# Patient Record
Sex: Female | Born: 1950
Health system: Southern US, Community
[De-identification: ages and names within clinical notes are randomized; demographics above are authoritative.]

## PROBLEM LIST (undated history)

## (undated) DIAGNOSIS — N83209 Unspecified ovarian cyst, unspecified side: Secondary | ICD-10-CM

## (undated) DIAGNOSIS — C801 Malignant (primary) neoplasm, unspecified: Secondary | ICD-10-CM

## (undated) DIAGNOSIS — E7800 Pure hypercholesterolemia, unspecified: Secondary | ICD-10-CM

## (undated) DIAGNOSIS — D0510 Intraductal carcinoma in situ of unspecified breast: Secondary | ICD-10-CM

## (undated) DIAGNOSIS — M8580 Other specified disorders of bone density and structure, unspecified site: Secondary | ICD-10-CM

## (undated) HISTORY — DX: Intraductal carcinoma in situ of unspecified breast: D05.10

## (undated) HISTORY — DX: Pure hypercholesterolemia, unspecified: E78.00

## (undated) HISTORY — DX: Malignant (primary) neoplasm, unspecified: C80.1

## (undated) HISTORY — DX: Unspecified ovarian cyst, unspecified side: N83.209

## (undated) HISTORY — DX: Other specified disorders of bone density and structure, unspecified site: M85.80

---

## 1976-12-07 HISTORY — PX: SALPINGOOPHORECTOMY: SHX82

## 1998-07-12 ENCOUNTER — Other Ambulatory Visit: Admission: RE | Admit: 1998-07-12 | Discharge: 1998-07-12 | Payer: Self-pay | Admitting: Obstetrics and Gynecology

## 1999-08-08 ENCOUNTER — Other Ambulatory Visit: Admission: RE | Admit: 1999-08-08 | Discharge: 1999-08-08 | Payer: Self-pay | Admitting: Obstetrics and Gynecology

## 2000-02-06 ENCOUNTER — Encounter: Admission: RE | Admit: 2000-02-06 | Discharge: 2000-02-06 | Payer: Self-pay | Admitting: Obstetrics and Gynecology

## 2000-02-06 ENCOUNTER — Encounter: Payer: Self-pay | Admitting: Obstetrics and Gynecology

## 2000-08-13 ENCOUNTER — Other Ambulatory Visit: Admission: RE | Admit: 2000-08-13 | Discharge: 2000-08-13 | Payer: Self-pay | Admitting: Obstetrics and Gynecology

## 2001-02-11 ENCOUNTER — Encounter: Payer: Self-pay | Admitting: Obstetrics and Gynecology

## 2001-02-11 ENCOUNTER — Encounter: Admission: RE | Admit: 2001-02-11 | Discharge: 2001-02-11 | Payer: Self-pay | Admitting: Obstetrics and Gynecology

## 2001-08-19 ENCOUNTER — Other Ambulatory Visit: Admission: RE | Admit: 2001-08-19 | Discharge: 2001-08-19 | Payer: Self-pay | Admitting: Obstetrics and Gynecology

## 2002-02-17 ENCOUNTER — Encounter: Payer: Self-pay | Admitting: Obstetrics and Gynecology

## 2002-02-17 ENCOUNTER — Encounter: Admission: RE | Admit: 2002-02-17 | Discharge: 2002-02-17 | Payer: Self-pay | Admitting: Obstetrics and Gynecology

## 2002-08-25 ENCOUNTER — Other Ambulatory Visit: Admission: RE | Admit: 2002-08-25 | Discharge: 2002-08-25 | Payer: Self-pay | Admitting: Obstetrics and Gynecology

## 2002-11-17 ENCOUNTER — Ambulatory Visit (HOSPITAL_COMMUNITY): Admission: RE | Admit: 2002-11-17 | Discharge: 2002-11-17 | Payer: Self-pay | Admitting: Gastroenterology

## 2003-03-02 ENCOUNTER — Encounter: Admission: RE | Admit: 2003-03-02 | Discharge: 2003-03-02 | Payer: Self-pay | Admitting: Obstetrics and Gynecology

## 2003-03-02 ENCOUNTER — Encounter: Payer: Self-pay | Admitting: Obstetrics and Gynecology

## 2003-08-31 ENCOUNTER — Other Ambulatory Visit: Admission: RE | Admit: 2003-08-31 | Discharge: 2003-08-31 | Payer: Self-pay | Admitting: Obstetrics and Gynecology

## 2004-03-07 ENCOUNTER — Encounter: Admission: RE | Admit: 2004-03-07 | Discharge: 2004-03-07 | Payer: Self-pay | Admitting: Obstetrics and Gynecology

## 2004-09-05 ENCOUNTER — Other Ambulatory Visit: Admission: RE | Admit: 2004-09-05 | Discharge: 2004-09-05 | Payer: Self-pay | Admitting: Obstetrics and Gynecology

## 2004-12-07 HISTORY — PX: BREAST BIOPSY: SHX20

## 2004-12-07 HISTORY — PX: BREAST LUMPECTOMY: SHX2

## 2005-03-13 ENCOUNTER — Encounter: Admission: RE | Admit: 2005-03-13 | Discharge: 2005-03-13 | Payer: Self-pay | Admitting: Obstetrics and Gynecology

## 2005-03-30 ENCOUNTER — Encounter: Admission: RE | Admit: 2005-03-30 | Discharge: 2005-03-30 | Payer: Self-pay | Admitting: Obstetrics and Gynecology

## 2005-09-10 ENCOUNTER — Other Ambulatory Visit: Admission: RE | Admit: 2005-09-10 | Discharge: 2005-09-10 | Payer: Self-pay | Admitting: Obstetrics and Gynecology

## 2005-09-11 ENCOUNTER — Encounter (INDEPENDENT_AMBULATORY_CARE_PROVIDER_SITE_OTHER): Payer: Self-pay | Admitting: *Deleted

## 2005-09-11 ENCOUNTER — Encounter: Admission: RE | Admit: 2005-09-11 | Discharge: 2005-09-11 | Payer: Self-pay | Admitting: Obstetrics and Gynecology

## 2005-09-11 ENCOUNTER — Encounter (INDEPENDENT_AMBULATORY_CARE_PROVIDER_SITE_OTHER): Payer: Self-pay | Admitting: Diagnostic Radiology

## 2005-09-14 ENCOUNTER — Encounter: Admission: RE | Admit: 2005-09-14 | Discharge: 2005-09-14 | Payer: Self-pay | Admitting: Obstetrics and Gynecology

## 2005-09-17 ENCOUNTER — Encounter: Admission: RE | Admit: 2005-09-17 | Discharge: 2005-09-17 | Payer: Self-pay | Admitting: General Surgery

## 2005-09-23 ENCOUNTER — Ambulatory Visit (HOSPITAL_BASED_OUTPATIENT_CLINIC_OR_DEPARTMENT_OTHER): Admission: RE | Admit: 2005-09-23 | Discharge: 2005-09-23 | Payer: Self-pay | Admitting: General Surgery

## 2005-09-23 ENCOUNTER — Ambulatory Visit (HOSPITAL_COMMUNITY): Admission: RE | Admit: 2005-09-23 | Discharge: 2005-09-23 | Payer: Self-pay | Admitting: General Surgery

## 2005-09-23 ENCOUNTER — Encounter (INDEPENDENT_AMBULATORY_CARE_PROVIDER_SITE_OTHER): Payer: Self-pay | Admitting: *Deleted

## 2005-09-23 ENCOUNTER — Encounter: Admission: RE | Admit: 2005-09-23 | Discharge: 2005-09-23 | Payer: Self-pay | Admitting: General Surgery

## 2005-10-05 ENCOUNTER — Ambulatory Visit: Payer: Self-pay | Admitting: Oncology

## 2005-10-14 ENCOUNTER — Ambulatory Visit: Admission: RE | Admit: 2005-10-14 | Discharge: 2005-12-23 | Payer: Self-pay | Admitting: Radiation Oncology

## 2005-12-07 DIAGNOSIS — Z923 Personal history of irradiation: Secondary | ICD-10-CM

## 2005-12-07 HISTORY — PX: BREAST BIOPSY: SHX20

## 2005-12-07 HISTORY — DX: Personal history of irradiation: Z92.3

## 2006-01-07 ENCOUNTER — Ambulatory Visit: Payer: Self-pay | Admitting: Oncology

## 2006-09-17 ENCOUNTER — Encounter: Admission: RE | Admit: 2006-09-17 | Discharge: 2006-09-17 | Payer: Self-pay | Admitting: General Surgery

## 2006-09-17 ENCOUNTER — Other Ambulatory Visit: Admission: RE | Admit: 2006-09-17 | Discharge: 2006-09-17 | Payer: Self-pay | Admitting: Obstetrics and Gynecology

## 2006-09-20 ENCOUNTER — Encounter (INDEPENDENT_AMBULATORY_CARE_PROVIDER_SITE_OTHER): Payer: Self-pay | Admitting: *Deleted

## 2006-09-20 ENCOUNTER — Encounter: Admission: RE | Admit: 2006-09-20 | Discharge: 2006-09-20 | Payer: Self-pay | Admitting: General Surgery

## 2007-03-11 ENCOUNTER — Encounter: Admission: RE | Admit: 2007-03-11 | Discharge: 2007-03-11 | Payer: Self-pay | Admitting: Obstetrics and Gynecology

## 2007-05-20 ENCOUNTER — Ambulatory Visit (HOSPITAL_COMMUNITY): Admission: RE | Admit: 2007-05-20 | Discharge: 2007-05-20 | Payer: Self-pay | Admitting: Internal Medicine

## 2007-09-23 ENCOUNTER — Other Ambulatory Visit: Admission: RE | Admit: 2007-09-23 | Discharge: 2007-09-23 | Payer: Self-pay | Admitting: Obstetrics and Gynecology

## 2007-09-29 ENCOUNTER — Encounter: Admission: RE | Admit: 2007-09-29 | Discharge: 2007-09-29 | Payer: Self-pay | Admitting: Obstetrics and Gynecology

## 2008-10-04 ENCOUNTER — Encounter: Admission: RE | Admit: 2008-10-04 | Discharge: 2008-10-04 | Payer: Self-pay | Admitting: Obstetrics and Gynecology

## 2008-10-05 ENCOUNTER — Other Ambulatory Visit: Admission: RE | Admit: 2008-10-05 | Discharge: 2008-10-05 | Payer: Self-pay | Admitting: Obstetrics and Gynecology

## 2008-10-05 ENCOUNTER — Ambulatory Visit: Payer: Self-pay | Admitting: Obstetrics and Gynecology

## 2008-10-05 ENCOUNTER — Encounter: Payer: Self-pay | Admitting: Obstetrics and Gynecology

## 2009-03-25 ENCOUNTER — Ambulatory Visit: Payer: Self-pay | Admitting: Obstetrics and Gynecology

## 2009-10-11 ENCOUNTER — Encounter: Admission: RE | Admit: 2009-10-11 | Discharge: 2009-10-11 | Payer: Self-pay | Admitting: Obstetrics and Gynecology

## 2009-10-18 ENCOUNTER — Other Ambulatory Visit: Admission: RE | Admit: 2009-10-18 | Discharge: 2009-10-18 | Payer: Self-pay | Admitting: Obstetrics and Gynecology

## 2009-10-18 ENCOUNTER — Encounter: Payer: Self-pay | Admitting: Obstetrics and Gynecology

## 2009-10-18 ENCOUNTER — Ambulatory Visit: Payer: Self-pay | Admitting: Obstetrics and Gynecology

## 2010-10-17 ENCOUNTER — Encounter: Admission: RE | Admit: 2010-10-17 | Discharge: 2010-10-17 | Payer: Self-pay | Admitting: Obstetrics and Gynecology

## 2010-11-14 ENCOUNTER — Ambulatory Visit: Payer: Self-pay | Admitting: Obstetrics and Gynecology

## 2010-11-14 ENCOUNTER — Other Ambulatory Visit
Admission: RE | Admit: 2010-11-14 | Discharge: 2010-11-14 | Payer: Self-pay | Source: Home / Self Care | Admitting: Obstetrics and Gynecology

## 2010-12-28 ENCOUNTER — Encounter: Payer: Self-pay | Admitting: Obstetrics and Gynecology

## 2011-04-24 NOTE — Op Note (Signed)
NAMECARLOTA, Shawna [name]                           ACCOUNT NO.:  [id number]   MEDICAL RECORD NO.:  [id number]                   PATIENT TYPE:  AMB   LOCATION:  ENDO                                 FACILITY:  [facility]   PHYSICIAN:  James L. Malon Kindle., M.D.          DATE OF BIRTH:  22-Feb-1951   DATE OF PROCEDURE:  11/17/2002  DATE OF DISCHARGE:                                 OPERATIVE REPORT   PROCEDURE PERFORMED:  Colonoscopy.   ENDOSCOPIST:  Llana Aliment. Edwards, M.D.   MEDICATIONS:  Fentanyl 80 mcg, Versed 8 mg IV.   INDICATIONS FOR PROCEDURE:  Strong family history of colon cancer.  Father  had colon cancer.  This is done for screening purposes.   DESCRIPTION OF PROCEDURE:  The procedure had been explained to the patient  and consent obtained.  With the patient in the left lateral decubitus  position, the pediatric adjustable Olympus scope was inserted and advanced  under direct visualization.  The prep was quite good.  The patient had an  extremely long tortuous colon and multiple maneuvers were required to  advance beyond the sigmoid including abdominal pressure and position  changes.  Finally with the patient in the right lateral decubitus position,  we were able to advance and using pressure we were able to advance to the  cecum.  The ileocecal valve and appendiceal orifice were seen.  The scope  was withdrawn and the cecum, ascending colon, transverse colon, descending  and sigmoid colon were seen well.  No diverticulosis, no polyps seen  throughout.  The rectum was free of polyps.  The scope was withdrawn.  The  patient tolerated the procedure well, maintained on low-flow oxygen and  pulse oximeter throughout the procedure.   ASSESSMENT:  No evidence of colon polyps in this high risk individual.   PLAN:  We will recommend yearly hemoccults and consider another colonoscopy  in five years.                                                James L. Malon Kindle., M.D.    Waldron Session  D:  11/17/2002  T:  11/17/2002  Job:  [id number]   cc:   Wilson Singer, M.D.  [address]  [place]  [place] [address]  Fax: [phone]

## 2011-04-24 NOTE — Op Note (Signed)
[name], [name]               ACCOUNT NO.:  [id number]   MEDICAL RECORD NO.:  [id number]          PATIENT TYPE:  AMB   LOCATION:  DSC                          FACILITY:  [facility]   PHYSICIAN:  Rose Phi. Maple Hudson, M.D.   DATE OF BIRTH:  1951/02/26   DATE OF PROCEDURE:  09/23/2005  DATE OF DISCHARGE:                                 OPERATIVE REPORT   PREOPERATIVE DIAGNOSIS:  Ductal carcinoma in situ of the left breast.   POSTOPERATIVE DIAGNOSIS:  Ductal carcinoma in situ of the left breast.   PROCEDURE:  Left partial mastectomy with needle localization and specimen  mammogram.   SURGEON:  Rose Phi. Maple Hudson, M.D.   ANESTHESIA:  General.   DESCRIPTION OF PROCEDURE:  Prior to coming to the operating room, two  bracketing wires were used to localize the area of calcifications in the 3  o'clock position of her left breast which had DCIS on biopsy.   After suitable general anesthesia was induced, the patient was placed in the  supine position with the arms extended on the arm board.  The left breast  was prepped and draped in the usual fashion.   A curved incision centered at the 3 o'clock position and using the two  previously placed wires as a guide was then outlined with the marking  pencil.  Incision was made and both wires were delivered into the middle of  the incision, and then a wide excision of the wires and the surrounding  tissue was carried out.  Hemostasis was obtained with the cautery. Specimen  mammogram confirmed the removal of the lesions.   With good hemostasis, I injected the incision with 0.25% Marcaine with  Adrenalin.   It was then closed in a single layer of subcuticular 4-0 Monocryl and Steri-  Strips.   Dressing applied, and the patient transferred to the recovery room in  satisfactory condition having tolerated the procedure well.      Rose Phi. Maple Hudson, M.D.  Electronically Signed     PRY/MEDQ  D:  09/23/2005  T:  09/23/2005  Job:  [id number]

## 2011-09-17 ENCOUNTER — Other Ambulatory Visit: Payer: Self-pay | Admitting: Obstetrics and Gynecology

## 2011-09-18 ENCOUNTER — Other Ambulatory Visit: Payer: Self-pay | Admitting: *Deleted

## 2011-09-18 DIAGNOSIS — Z9889 Other specified postprocedural states: Secondary | ICD-10-CM

## 2011-10-19 ENCOUNTER — Ambulatory Visit
Admission: RE | Admit: 2011-10-19 | Discharge: 2011-10-19 | Disposition: A | Discharge status: Home / Self Care | Payer: BC Managed Care – PPO | Source: Ambulatory Visit | Attending: Obstetrics and Gynecology | Admitting: Obstetrics and Gynecology

## 2011-10-19 DIAGNOSIS — Z9889 Other specified postprocedural states: Secondary | ICD-10-CM

## 2011-11-05 ENCOUNTER — Encounter: Payer: Self-pay | Admitting: *Deleted

## 2011-11-05 DIAGNOSIS — N83209 Unspecified ovarian cyst, unspecified side: Secondary | ICD-10-CM | POA: Insufficient documentation

## 2011-11-05 DIAGNOSIS — Z86000 Personal history of in-situ neoplasm of breast: Secondary | ICD-10-CM | POA: Insufficient documentation

## 2011-11-25 ENCOUNTER — Encounter: Payer: Self-pay | Admitting: Obstetrics and Gynecology

## 2011-11-25 ENCOUNTER — Ambulatory Visit (INDEPENDENT_AMBULATORY_CARE_PROVIDER_SITE_OTHER): Payer: BC Managed Care – PPO | Admitting: Obstetrics and Gynecology

## 2011-11-25 ENCOUNTER — Other Ambulatory Visit (HOSPITAL_COMMUNITY)
Admission: RE | Admit: 2011-11-25 | Discharge: 2011-11-25 | Disposition: A | Discharge status: Home / Self Care | Payer: BC Managed Care – PPO | Source: Ambulatory Visit | Attending: Obstetrics and Gynecology | Admitting: Obstetrics and Gynecology

## 2011-11-25 VITALS — BP 120/76 | Ht 63.0 in | Wt 186.0 lb

## 2011-11-25 DIAGNOSIS — M899 Disorder of bone, unspecified: Secondary | ICD-10-CM

## 2011-11-25 DIAGNOSIS — M8580 Other specified disorders of bone density and structure, unspecified site: Secondary | ICD-10-CM

## 2011-11-25 DIAGNOSIS — Z01419 Encounter for gynecological examination (general) (routine) without abnormal findings: Secondary | ICD-10-CM

## 2011-11-25 DIAGNOSIS — R823 Hemoglobinuria: Secondary | ICD-10-CM

## 2011-11-25 NOTE — Progress Notes (Signed)
Patient came to see me today for her annual GYN exam. She is not having menopausal symptoms. She is having no vaginal bleeding. She is having no pelvic pain. She is on drug holiday with osteopenia. She has had no fractures. She does her lab through PCP. She is up-to-date on mammograms. She is unemployed and is looking for a job. She has atrophic vaginitis but does not require treatment.  Physical examination:  Kennon Portela present HEENT within normal limits. Neck: Thyroid not large. No masses. Supraclavicular nodes: not enlarged. Breasts: Examined in both sitting midline position. No skin changes and no masses. Abdomen: Soft no guarding rebound or masses or hernia. Pelvic: External: Within normal limits. BUS: Within normal limits. Vaginal:within normal limits. Good estrogen effect. No evidence of cystocele rectocele or enterocele. Cervix: clean. Uterus: Normal size and shape. Adnexa: No masses. Rectovaginal exam: Confirmatory and negative. Extremities: Within normal limits.  Assessment: #1. Normal GYN exam #2. Atrophic vaginitis #3. Osteopenia on drug holiday  Plan: Bone density scheduled. Continue yearly mammograms.

## 2011-11-25 NOTE — Progress Notes (Signed)
Addended by: Cammie Mcgee T on: 11/25/2011 04:07 PM   Modules accepted: Orders

## 2012-03-03 ENCOUNTER — Ambulatory Visit (INDEPENDENT_AMBULATORY_CARE_PROVIDER_SITE_OTHER): Payer: BC Managed Care – PPO | Admitting: Surgery

## 2012-03-03 ENCOUNTER — Encounter (INDEPENDENT_AMBULATORY_CARE_PROVIDER_SITE_OTHER): Payer: Self-pay | Admitting: Surgery

## 2012-03-03 VITALS — BP 122/72 | HR 72 | Temp 97.7°F | Resp 18 | Ht 63.0 in | Wt 178.4 lb

## 2012-03-03 DIAGNOSIS — D0590 Unspecified type of carcinoma in situ of unspecified breast: Secondary | ICD-10-CM

## 2012-03-03 DIAGNOSIS — D0510 Intraductal carcinoma in situ of unspecified breast: Secondary | ICD-10-CM

## 2012-03-03 NOTE — Progress Notes (Signed)
[facility]  Ovidio Kin, MD,  FACS [address] [place]  [address] [place], [place]    [address] Phone:  [phone] FAX:  [phone]   Re:   Shawna [name] DOB:   [birth year] MRN:   [id number]  ASSESSMENT AND PLAN: 1.  Left breast cancer, DCIS.  Lumpectomy by Dr. Luan Moore - 09/23/2005  2.5 cm, ER - 99%, PR - 63%.  She had Rad Tx by Dr. Lyda Jester.  Sees Dr. Mervin Hack.  She did not take an anti-estrogen.  She is disease free.  She will see me in one year for follow up.  2.  Hypercholesterolemia.  HISTORY OF PRESENT ILLNESS: Chief Complaint  Patient presents with  . Breast Cancer Long Term Follow Up    Shawna [name] is a 61 y.o. (DOB: [birth year])  white female who is a patient of MCKEOWN,WILLIAM Karl Erway, MD, MD and comes to me today for follow up of left breast cancer.  She is doing well.  Has no new concern or problem.  PHYSICAL EXAM: BP 122/72  Pulse 72  Resp 18  Ht 5\' 3"  (1.6 m)  Wt 178 lb 6.4 oz (80.922 kg)  BMI 31.60 kg/m2  HEENT:  Pupils equal.  Dentition good.  No injury. NECK:  Supple.  No thyroid mass. LYMPH NODES:  No cervical, supraclavicular, or axillary adenopathy. BREASTS -  RIGHT:  No palpable mass or nodule.  No nipple discharge.   LEFT:  No palpable mass or nodule.  No nipple discharge.  Scar 3 o'clock position left breast. UPPER EXTREMITIES:  No evidence of lymphedema.  DATA REVIEWED: Mammogram at [facility] - 10/19/2011 - negative   Ovidio Kin, MD, FACS Office:  [phone]

## 2012-08-03 ENCOUNTER — Other Ambulatory Visit: Payer: Self-pay | Admitting: *Deleted

## 2012-08-03 DIAGNOSIS — Z853 Personal history of malignant neoplasm of breast: Secondary | ICD-10-CM

## 2012-10-25 ENCOUNTER — Ambulatory Visit
Admission: RE | Admit: 2012-10-25 | Discharge: 2012-10-25 | Disposition: A | Discharge status: Home / Self Care | Payer: BC Managed Care – PPO | Source: Ambulatory Visit | Attending: Obstetrics and Gynecology | Admitting: Obstetrics and Gynecology

## 2012-10-25 DIAGNOSIS — Z853 Personal history of malignant neoplasm of breast: Secondary | ICD-10-CM

## 2012-12-08 ENCOUNTER — Encounter: Payer: BC Managed Care – PPO | Admitting: Obstetrics and Gynecology

## 2012-12-12 ENCOUNTER — Encounter: Payer: Self-pay | Admitting: Gynecology

## 2012-12-20 ENCOUNTER — Ambulatory Visit (INDEPENDENT_AMBULATORY_CARE_PROVIDER_SITE_OTHER): Payer: BC Managed Care – PPO | Admitting: Gynecology

## 2012-12-20 ENCOUNTER — Encounter: Payer: Self-pay | Admitting: Gynecology

## 2012-12-20 VITALS — BP 124/78 | Ht 63.0 in | Wt 152.0 lb

## 2012-12-20 DIAGNOSIS — M899 Disorder of bone, unspecified: Secondary | ICD-10-CM

## 2012-12-20 DIAGNOSIS — Z01419 Encounter for gynecological examination (general) (routine) without abnormal findings: Secondary | ICD-10-CM

## 2012-12-20 DIAGNOSIS — M8580 Other specified disorders of bone density and structure, unspecified site: Secondary | ICD-10-CM

## 2012-12-20 DIAGNOSIS — N952 Postmenopausal atrophic vaginitis: Secondary | ICD-10-CM

## 2012-12-20 NOTE — Patient Instructions (Signed)
Followup for bone density as scheduled. 

## 2012-12-20 NOTE — Progress Notes (Signed)
Shawna [name] September 25, 1951 [id number]        62 y.o.  G0P0 for annual exam.  Former patient Dr. Verl [name]  Past medical history,surgical history, medications, allergies, family history and social history were all reviewed and documented in the EPIC chart. ROS:  Was performed and pertinent positives and negatives are included in the history.  Exam: Shawna [name] assistant Filed Vitals:   12/20/12 1610  BP: 124/78  Height: 5\' 3"  (1.6 m)  Weight: 152 lb (68.947 kg)   General appearance  Normal Skin grossly normal Head/Neck normal with no cervical or supraclavicular adenopathy thyroid normal Lungs  clear Cardiac RR, without RMG Abdominal  soft, nontender, without masses, organomegaly or hernia Breasts  examined lying and sitting without masses, retractions, discharge or axillary adenopathy.  Well-healed left lumpectomy scar. Pelvic  Ext/BUS/vagina  Atrophic changes with narrow introitus  Cervix  normal   Uterus  axial, normal size, shape and contour, midline and mobile nontender   Adnexa  Without masses or tenderness    Anus and perineum  normal   Rectovaginal  normal sphincter tone without palpated masses or tenderness.    Assessment/Plan:  62 y.o. G0P0 female for annual exam.   1. Postmenopausal/atrophic vaginitis.  Patient asymptomatic. She's not sexually active and this is not an issue and we'll continue to monitor. 2. Osteopenia. DEXA 03/2009 with T score -1.7. Had been on bisphosphonates to include Actonel and then Fosamax for approximately 4 years and then stopped 2010 for drug holiday. Has not had a follow up DEXA. Plan DEXA now. Is on extra vitamin D. To Dr. Kathryne [name] office. Is planning appointment with him and asked her make sure he checks a vitamin D level to make sure that she is within the appropriate range. Increase calcium reviewed. 3. Mammography 10/2012. Does have history of DCIS status post lumpectomy 2006. Continue with annual mammography. SBE monthly reviewed. 4. Colonoscopy  2009 due for repeat this year and she knows to schedule it. 5. Pap smear 11/2011. No Pap smear done this year. No history of abnormal Pap smears. Discussed current screening guidelines and will plan repeat at 3 year interval. 6. Health maintenance.  No blood work done as this is all done through her primary physician's office who she sees on a regular basis. Follow up for DEXA otherwise 1 year, sooner as needed.    Shawna Lords MD, 4:53 PM 12/20/2012

## 2012-12-21 LAB — URINALYSIS W MICROSCOPIC + REFLEX CULTURE
Bacteria, UA: NONE SEEN
Bilirubin Urine: NEGATIVE
Casts: NONE SEEN
Crystals: NONE SEEN
Glucose, UA: NEGATIVE mg/dL
Hgb urine dipstick: NEGATIVE
Ketones, ur: NEGATIVE mg/dL
Specific Gravity, Urine: 1.017 (ref 1.005–1.030)
pH: 7 (ref 5.0–8.0)

## 2013-01-23 ENCOUNTER — Other Ambulatory Visit (HOSPITAL_COMMUNITY): Payer: Self-pay | Admitting: Internal Medicine

## 2013-01-23 ENCOUNTER — Ambulatory Visit (HOSPITAL_COMMUNITY)
Admission: RE | Admit: 2013-01-23 | Discharge: 2013-01-23 | Disposition: A | Discharge status: Home / Self Care | Payer: BC Managed Care – PPO | Source: Ambulatory Visit | Attending: Internal Medicine | Admitting: Internal Medicine

## 2013-01-23 DIAGNOSIS — M25519 Pain in unspecified shoulder: Secondary | ICD-10-CM | POA: Insufficient documentation

## 2013-05-24 HISTORY — PX: COLONOSCOPY: SHX174

## 2013-05-24 LAB — HM COLONOSCOPY

## 2013-09-13 ENCOUNTER — Other Ambulatory Visit: Payer: Self-pay | Admitting: Gynecology

## 2013-09-13 DIAGNOSIS — Z9889 Other specified postprocedural states: Secondary | ICD-10-CM

## 2013-09-13 DIAGNOSIS — Z853 Personal history of malignant neoplasm of breast: Secondary | ICD-10-CM

## 2013-10-26 ENCOUNTER — Ambulatory Visit
Admission: RE | Admit: 2013-10-26 | Discharge: 2013-10-26 | Disposition: A | Discharge status: Home / Self Care | Payer: BC Managed Care – PPO | Source: Ambulatory Visit | Attending: Gynecology | Admitting: Gynecology

## 2013-10-26 DIAGNOSIS — Z853 Personal history of malignant neoplasm of breast: Secondary | ICD-10-CM

## 2013-10-26 DIAGNOSIS — Z9889 Other specified postprocedural states: Secondary | ICD-10-CM

## 2013-11-23 ENCOUNTER — Encounter: Payer: Self-pay | Admitting: Internal Medicine

## 2013-11-24 ENCOUNTER — Ambulatory Visit (INDEPENDENT_AMBULATORY_CARE_PROVIDER_SITE_OTHER): Payer: BC Managed Care – PPO | Admitting: Emergency Medicine

## 2013-11-24 ENCOUNTER — Encounter: Payer: Self-pay | Admitting: Emergency Medicine

## 2013-11-24 VITALS — BP 124/82 | HR 80 | Temp 98.0°F | Resp 18 | Ht 63.0 in | Wt 187.0 lb

## 2013-11-24 DIAGNOSIS — E782 Mixed hyperlipidemia: Secondary | ICD-10-CM | POA: Insufficient documentation

## 2013-11-24 DIAGNOSIS — E559 Vitamin D deficiency, unspecified: Secondary | ICD-10-CM | POA: Insufficient documentation

## 2013-11-24 DIAGNOSIS — R3915 Urgency of urination: Secondary | ICD-10-CM

## 2013-11-24 DIAGNOSIS — I10 Essential (primary) hypertension: Secondary | ICD-10-CM | POA: Insufficient documentation

## 2013-11-24 DIAGNOSIS — R7309 Other abnormal glucose: Secondary | ICD-10-CM

## 2013-11-24 LAB — CBC WITH DIFFERENTIAL/PLATELET
Basophils Absolute: 0 10*3/uL (ref 0.0–0.1)
Basophils Relative: 0 % (ref 0–1)
Eosinophils Relative: 1 % (ref 0–5)
HCT: 40.1 % (ref 36.0–46.0)
Lymphocytes Relative: 29 % (ref 12–46)
Lymphs Abs: 2.2 10*3/uL (ref 0.7–4.0)
MCV: 89.5 fL (ref 78.0–100.0)
Monocytes Absolute: 0.5 10*3/uL (ref 0.1–1.0)
Neutro Abs: 4.7 10*3/uL (ref 1.7–7.7)
Neutrophils Relative %: 64 % (ref 43–77)
Platelets: 223 10*3/uL (ref 150–400)
RBC: 4.48 MIL/uL (ref 3.87–5.11)
RDW: 14 % (ref 11.5–15.5)
WBC: 7.4 10*3/uL (ref 4.0–10.5)

## 2013-11-24 LAB — HEMOGLOBIN A1C
Hgb A1c MFr Bld: 6 % — ABNORMAL HIGH (ref <5.7)
Mean Plasma Glucose: 126 mg/dL — ABNORMAL HIGH (ref <117)

## 2013-11-24 NOTE — Patient Instructions (Signed)
PREDiabetes and Exercise Exercising regularly is important. It is not just about losing weight. It has many health benefits, such as:  Improving your overall fitness, flexibility, and endurance.  Increasing your bone density.  Helping with weight control.  Decreasing your body fat.  Increasing your muscle strength.  Reducing stress and tension.  Improving your overall health. People with diabetes who exercise gain additional benefits because exercise:  Reduces appetite.  Improves the body's use of blood sugar (glucose).  Helps lower or control blood glucose.  Decreases blood pressure.  Helps control blood lipids (such as cholesterol and triglycerides).  Improves the body's use of the hormone insulin by:  Increasing the body's insulin sensitivity.  Reducing the body's insulin needs.  Decreases the risk for heart disease because exercising:  Lowers cholesterol and triglycerides levels.  Increases the levels of good cholesterol (such as high-density lipoproteins [HDL]) in the body.  Lowers blood glucose levels. YOUR ACTIVITY PLAN  Choose an activity that you enjoy and set realistic goals. Your health care provider or diabetes educator can help you make an activity plan that works for you. You can break activities into 2 or 3 sessions throughout the day. Doing so is as good as one long session. Exercise ideas include:  Taking the dog for a walk.  Taking the stairs instead of the elevator.  Dancing to your favorite song.  Doing your favorite exercise with a friend. RECOMMENDATIONS FOR EXERCISING WITH TYPE 1 OR TYPE 2 DIABETES   Check your blood glucose before exercising. If blood glucose levels are greater than 240 mg/dL, check for urine ketones. Do not exercise if ketones are present.  Avoid injecting insulin into areas of the body that are going to be exercised. For example, avoid injecting insulin into:  The arms when playing tennis.  The legs when  jogging.  Keep a record of:  Food intake before and after you exercise.  Expected peak times of insulin action.  Blood glucose levels before and after you exercise.  The type and amount of exercise you have done.  Review your records with your health care provider. Your health care provider will help you to develop guidelines for adjusting food intake and insulin amounts before and after exercising.  If you take insulin or oral hypoglycemic agents, watch for signs and symptoms of hypoglycemia. They include:  Dizziness.  Shaking.  Sweating.  Chills.  Confusion.  Drink plenty of water while you exercise to prevent dehydration or heat stroke. Body water is lost during exercise and must be replaced.  Talk to your health care provider before starting an exercise program to make sure it is safe for you. Remember, almost any type of activity is better than none. Document Released: 02/13/2004 Document Revised: 07/26/2013 Document Reviewed: 05/02/2013 Exit[employer] Patient Information 2014 [employer].  

## 2013-11-24 NOTE — Progress Notes (Signed)
Subjective:    Patient ID: Shawna [name], female    DOB: June 24, 1951, 62 y.o.   MRN: [id number]  HPI Comments: 62 YO female presents for 3 month F/U for HTN, Cholesterol, Pre-Dm, D. Deficient. HER LAST LABS T 156 TG 86 H 52 LDL 87 A1C 6.2 INSULIN 32 MAG 1.8 D 58 Exercising sporadically. Eating healthy. Her BP was good at home.  Has noticed increased urine urgency x 3 weeks. She notes occasional cloudy. Denies any other UTI SX.  Hypertension  Hyperlipidemia   Current Outpatient Prescriptions on File Prior to Visit  Medication Sig Dispense Refill  . atorvastatin (LIPITOR) 80 MG tablet Take 80 mg by mouth daily.      . cholecalciferol (VITAMIN D) 1000 UNITS tablet Take 10,000 Units by mouth daily.       Marland Kitchen buPROPion (WELLBUTRIN SR) 150 MG 12 hr tablet Take 150 mg by mouth 2 (two) times daily.      . Calcium Carbonate-Vit D-Min (CALTRATE PLUS PO) Take by mouth.        . citalopram (CELEXA) 40 MG tablet Take 40 mg by mouth daily. Take 1/2 pill      . Multiple Vitamin (MULTIVITAMIN) tablet Take 1 tablet by mouth daily.         No current facility-administered medications on file prior to visit.   ALLERGIES Nitrofurantoin monohyd macro  Past Medical History  Diagnosis Date  . Hypercholesteremia   . 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 Serous cyst of ovary     left  . Osteopenia 03/2009    T score -1.7      Review of Systems  Genitourinary: Positive for urgency.  All other systems reviewed and are negative.   BP 124/82  Pulse 80  Temp(Src) 98 F (36.7 C) (Temporal)  Resp 18  Ht 5\' 3"  (1.6 m)  Wt 187 lb (84.823 kg)  BMI 33.13 kg/m2     Objective:   Physical Exam  Nursing note and vitals reviewed. Constitutional: She is oriented to person, place, and time. She appears well-developed and well-nourished. No distress.  HENT:  Head: Normocephalic and atraumatic.  Right Ear: External ear normal.  Left Ear: External ear normal.  Nose: Nose normal.   Mouth/Throat: Oropharynx is clear and moist.  Eyes: Conjunctivae and EOM are normal.  Neck: Normal range of motion. Neck supple. No JVD present. No thyromegaly present.  Cardiovascular: Normal rate, regular rhythm, normal heart sounds and intact distal pulses.   Pulmonary/Chest: Effort normal and breath sounds normal.  Abdominal: Soft. Bowel sounds are normal. She exhibits no distension and no mass. There is no tenderness. There is no rebound and no guarding.  Musculoskeletal: Normal range of motion. She exhibits no edema and no tenderness.  Lymphadenopathy:    She has no cervical adenopathy.  Neurological: She is alert and oriented to person, place, and time. No cranial nerve deficit.  Skin: Skin is warm and dry. No rash noted. No erythema. No pallor.  Psychiatric: She has a normal mood and affect. Her behavior is normal. Judgment and thought content normal.          Assessment & Plan:  1.  3 month F/U for HTN, Cholesterol, Pre-Dm, D. Deficient. Needs healthy diet, cardio QD and obtain healthy weight. Check Labs, Check BP if >130/80 call office 2.Urgency with urine- Check labs, Pelvic floor rehab if no improvement.

## 2013-11-25 LAB — URINALYSIS, ROUTINE W REFLEX MICROSCOPIC
Hgb urine dipstick: NEGATIVE
Ketones, ur: NEGATIVE mg/dL
Leukocytes, UA: NEGATIVE
Nitrite: NEGATIVE
Protein, ur: NEGATIVE mg/dL
Urobilinogen, UA: 0.2 mg/dL (ref 0.0–1.0)
pH: 6.5 (ref 5.0–8.0)

## 2013-11-25 LAB — HEPATIC FUNCTION PANEL
ALT: 15 U/L (ref 0–35)
AST: 18 U/L (ref 0–37)
Bilirubin, Direct: 0.1 mg/dL (ref 0.0–0.3)
Total Protein: 6.4 g/dL (ref 6.0–8.3)

## 2013-11-25 LAB — BASIC METABOLIC PANEL WITH GFR
BUN: 16 mg/dL (ref 6–23)
CO2: 27 mEq/L (ref 19–32)
Chloride: 104 mEq/L (ref 96–112)
Creat: 0.76 mg/dL (ref 0.50–1.10)
GFR, Est Non African American: 84 mL/min
Glucose, Bld: 102 mg/dL — ABNORMAL HIGH (ref 70–99)
Potassium: 4.6 mEq/L (ref 3.5–5.3)

## 2013-11-25 LAB — INSULIN, FASTING: Insulin fasting, serum: 28 u[IU]/mL (ref 3–28)

## 2013-11-25 LAB — LIPID PANEL
Cholesterol: 167 mg/dL (ref 0–200)
Triglycerides: 63 mg/dL (ref <150)

## 2013-11-26 ENCOUNTER — Other Ambulatory Visit: Payer: Self-pay | Admitting: Emergency Medicine

## 2013-11-26 DIAGNOSIS — R3915 Urgency of urination: Secondary | ICD-10-CM

## 2013-11-26 LAB — URINE CULTURE: Colony Count: 3000

## 2013-11-27 ENCOUNTER — Telehealth: Payer: Self-pay | Admitting: *Deleted

## 2013-11-27 NOTE — Telephone Encounter (Signed)
Message copied by Laurence Spates on Mon Nov 27, 2013 10:06 AM ------      Message from: [place], [place] R      Created: Sun Nov 26, 2013  6:31 PM       Cholesterol, A1c elevated need better/ healthier diet, cardio and weight loss. Continue vitamins and prescriptions same. ------

## 2013-12-05 NOTE — Telephone Encounter (Signed)
Patient aware of lab results and instructions.  Left info on patient's answering machine as instructed by patient.

## 2013-12-21 ENCOUNTER — Encounter: Payer: Self-pay | Admitting: Gynecology

## 2013-12-27 ENCOUNTER — Other Ambulatory Visit (HOSPITAL_COMMUNITY)
Admission: RE | Admit: 2013-12-27 | Discharge: 2013-12-27 | Disposition: A | Discharge status: Home / Self Care | Payer: BC Managed Care – PPO | Source: Ambulatory Visit | Attending: Gynecology | Admitting: Gynecology

## 2013-12-27 ENCOUNTER — Ambulatory Visit (INDEPENDENT_AMBULATORY_CARE_PROVIDER_SITE_OTHER): Payer: BC Managed Care – PPO | Admitting: Gynecology

## 2013-12-27 ENCOUNTER — Encounter: Payer: Self-pay | Admitting: Gynecology

## 2013-12-27 VITALS — BP 120/78 | Ht 63.0 in | Wt 190.0 lb

## 2013-12-27 DIAGNOSIS — M949 Disorder of cartilage, unspecified: Secondary | ICD-10-CM

## 2013-12-27 DIAGNOSIS — Z01419 Encounter for gynecological examination (general) (routine) without abnormal findings: Secondary | ICD-10-CM | POA: Insufficient documentation

## 2013-12-27 DIAGNOSIS — M899 Disorder of bone, unspecified: Secondary | ICD-10-CM

## 2013-12-27 DIAGNOSIS — M8580 Other specified disorders of bone density and structure, unspecified site: Secondary | ICD-10-CM

## 2013-12-27 NOTE — Progress Notes (Signed)
Shawna [name] 29-Apr-1951 [id number]        62 y.o.  G0P0 for annual exam.  Several issues that are below.  Past medical history,surgical history, problem list, medications, allergies, family history and social history were all reviewed and documented in the EPIC chart.  ROS:  Performed and pertinent positives and negatives are included in the history, assessment and plan .  Exam: [name] assistant Filed Vitals:   12/27/13 1155  BP: 120/78  Height: 5\' 3"  (1.6 m)  Weight: 190 lb (86.183 kg)   General appearance  Normal Skin grossly normal Head/Neck normal with no cervical or supraclavicular adenopathy thyroid normal Lungs  clear Cardiac RR, without RMG Abdominal  soft, nontender, without masses, organomegaly or hernia Breasts  examined lying and sitting without masses, retractions, discharge or axillary adenopathy. Pelvic  Ext/BUS/vagina  Normal with atrophic changes  Cervix  Normal with atrophic changes  Uterus  axial to anteverted, normal size, shape and contour, midline and mobile nontender   Adnexa  Without masses or tenderness    Anus and perineum  Normal   Rectovaginal  Normal sphincter tone without palpated masses or tenderness.    Assessment/Plan:  63 y.o. G0P0 female for annual exam.   1. Postmenopausal. Does have vaginal atrophy but is not symptomatic from a dryness standpoint routinely. She is not sexually active. No vaginal bleeding. No significant hot flashes or night sweats. Will continue to monitor. Report any vaginal bleeding. 2. Osteopenia. DEXA 03/2009 T score -1.7. Never followed up for repeat DEXA as recommended last year. Strongly recommended she schedule repeat DEXA now and she agrees to do so. Had been on Fosamax for approximately 4 years and then stopped 2010 for fear of side effects. I reviewed the issues of bisphosphonates and risks to include osteonecrosis of the jaw, atypical fractures and GERD. Alternatives such as Prolia with risks of rashes and infections  discussed. Evista possibility also reviewed given her history of DCIS. Will rediscuss all of this after she has her bone density. Increase calcium and vitamin D recommendations reviewed. 3. Pap smear 2012. Pap smear repeated today. No history of significant abnormal Pap smears. Plan 3 year repeat interval assuming this Pap smear is normal. 4. Mammography 10/2013. Continue with annual mammography. SBE monthly reviewed. 5. Colonoscopy 2013. Repeat at their recommended interval. 6. Health maintenance. No routine blood work done as she reports this all done through her primary physician's office. Followup for DEXA otherwise 1 year.   Note: This document was prepared with digital dictation and possible smart phrase technology. Any transcriptional errors that result from this process are unintentional.   Anastasio Auerbach MD, 12:33 PM 12/27/2013

## 2013-12-27 NOTE — Patient Instructions (Signed)
Followup for bone density as scheduled. 

## 2013-12-28 LAB — URINALYSIS W MICROSCOPIC + REFLEX CULTURE
Bacteria, UA: NONE SEEN
Bilirubin Urine: NEGATIVE
Casts: NONE SEEN
Crystals: NONE SEEN
Glucose, UA: NEGATIVE mg/dL
Hgb urine dipstick: NEGATIVE
Ketones, ur: NEGATIVE mg/dL
Leukocytes, UA: NEGATIVE
Nitrite: NEGATIVE
Protein, ur: NEGATIVE mg/dL
Specific Gravity, Urine: 1.019 (ref 1.005–1.030)
Squamous Epithelial / HPF: NONE SEEN
Urobilinogen, UA: 0.2 mg/dL (ref 0.0–1.0)
pH: 6 (ref 5.0–8.0)

## 2014-01-15 ENCOUNTER — Other Ambulatory Visit: Payer: Self-pay | Admitting: Internal Medicine

## 2014-01-15 DIAGNOSIS — M159 Polyosteoarthritis, unspecified: Secondary | ICD-10-CM

## 2014-01-19 ENCOUNTER — Ambulatory Visit (INDEPENDENT_AMBULATORY_CARE_PROVIDER_SITE_OTHER): Payer: BC Managed Care – PPO | Admitting: Surgery

## 2014-01-19 VITALS — BP 130/76 | HR 89 | Temp 98.0°F | Resp 18 | Ht 62.0 in | Wt 190.0 lb

## 2014-01-19 DIAGNOSIS — D0590 Unspecified type of carcinoma in situ of unspecified breast: Secondary | ICD-10-CM

## 2014-01-19 DIAGNOSIS — D0510 Intraductal carcinoma in situ of unspecified breast: Secondary | ICD-10-CM

## 2014-01-19 NOTE — Progress Notes (Signed)
Rhodes, MD,  [address]  [place], [place]    [place] Phone:  [phone] FAX:  [phone]   Re:   Shawna [name] DOB:   December 12, 1950 MRN:   [id number]  ASSESSMENT AND PLAN: 1.  Left breast cancer, DCIS.  Lumpectomy by Dr. Charlynne Pander - 09/23/2005  2.5 cm, ER - 99%, PR - 63%.  She had Rad Tx by Dr. Melina Modena.  Sees Dr. Thomasene Ripple.  She did not take an anti-estrogen.  She is disease free.  She will see me in one year for follow up.  I plan to follow her until at least 10 years post op.  2.  Hypercholesterolemia.  HISTORY OF PRESENT ILLNESS: Chief Complaint  Patient presents with  . Breast Cancer Long Term Follow Up   Shawna [name] is a 63 y.o. (DOB: Jan 04, 1951)  white female who is a patient of MCKEOWN,WILLIAM [name], MD and comes to me today for follow up of left breast cancer. Comes by herself. She is doing well.  Has no new concern or problem.  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  PHYSICAL EXAM: BP 130/76  Pulse 89  Temp(Src) 98 F (36.7 C)  Resp 18  Ht 5\' 2"  (1.575 m)  Wt 190 lb (86.183 kg)  BMI 34.74 kg/m2  HEENT:  Pupils equal.  Dentition good.   NECK:  Supple.  No thyroid mass. LYMPH NODES:  No cervical, supraclavicular, or axillary adenopathy. BREASTS -  RIGHT:  No palpable mass or nodule.  No nipple discharge.   LEFT:  No palpable mass or nodule.  No nipple discharge.  Scar 3 o'clock position left breast. UPPER EXTREMITIES:  No evidence of lymphedema.  DATA REVIEWED: Mammogram at [facility] - 10/18/2013 - negative   Alphonsa Overall, MD, [place] Office:  [phone]

## 2014-03-05 ENCOUNTER — Ambulatory Visit (INDEPENDENT_AMBULATORY_CARE_PROVIDER_SITE_OTHER): Payer: BC Managed Care – PPO | Admitting: Internal Medicine

## 2014-03-05 ENCOUNTER — Encounter: Payer: Self-pay | Admitting: Internal Medicine

## 2014-03-05 VITALS — BP 126/82 | HR 76 | Temp 97.3°F | Resp 18 | Ht 63.75 in | Wt 186.0 lb

## 2014-03-05 DIAGNOSIS — Z113 Encounter for screening for infections with a predominantly sexual mode of transmission: Secondary | ICD-10-CM

## 2014-03-05 DIAGNOSIS — R7402 Elevation of levels of lactic acid dehydrogenase (LDH): Secondary | ICD-10-CM

## 2014-03-05 DIAGNOSIS — R7401 Elevation of levels of liver transaminase levels: Secondary | ICD-10-CM

## 2014-03-05 DIAGNOSIS — Z1212 Encounter for screening for malignant neoplasm of rectum: Secondary | ICD-10-CM

## 2014-03-05 DIAGNOSIS — R7309 Other abnormal glucose: Secondary | ICD-10-CM

## 2014-03-05 DIAGNOSIS — E559 Vitamin D deficiency, unspecified: Secondary | ICD-10-CM

## 2014-03-05 DIAGNOSIS — R740 Nonspecific elevation of levels of transaminase and lactic acid dehydrogenase [LDH]: Secondary | ICD-10-CM

## 2014-03-05 DIAGNOSIS — Z79899 Other long term (current) drug therapy: Secondary | ICD-10-CM

## 2014-03-05 DIAGNOSIS — Z0000 Encounter for general adult medical examination without abnormal findings: Secondary | ICD-10-CM

## 2014-03-05 DIAGNOSIS — I10 Essential (primary) hypertension: Secondary | ICD-10-CM

## 2014-03-05 DIAGNOSIS — Z111 Encounter for screening for respiratory tuberculosis: Secondary | ICD-10-CM

## 2014-03-05 LAB — CBC WITH DIFFERENTIAL/PLATELET
Basophils Absolute: 0.1 10*3/uL (ref 0.0–0.1)
Basophils Relative: 1 % (ref 0–1)
Eosinophils Absolute: 0.3 10*3/uL (ref 0.0–0.7)
Eosinophils Relative: 4 % (ref 0–5)
HCT: 41.2 % (ref 36.0–46.0)
Hemoglobin: 14 g/dL (ref 12.0–15.0)
LYMPHS ABS: 1.9 10*3/uL (ref 0.7–4.0)
LYMPHS PCT: 29 % (ref 12–46)
MCH: 29.5 pg (ref 26.0–34.0)
MCHC: 34 g/dL (ref 30.0–36.0)
MCV: 86.9 fL (ref 78.0–100.0)
Monocytes Absolute: 0.5 10*3/uL (ref 0.1–1.0)
Monocytes Relative: 7 % (ref 3–12)
NEUTROS PCT: 59 % (ref 43–77)
Neutro Abs: 3.9 10*3/uL (ref 1.7–7.7)
Platelets: 208 10*3/uL (ref 150–400)
RBC: 4.74 MIL/uL (ref 3.87–5.11)
RDW: 14.9 % (ref 11.5–15.5)
WBC: 6.6 10*3/uL (ref 4.0–10.5)

## 2014-03-05 LAB — HEMOGLOBIN A1C
Hgb A1c MFr Bld: 6.2 % — ABNORMAL HIGH (ref <5.7)
Mean Plasma Glucose: 131 mg/dL — ABNORMAL HIGH (ref <117)

## 2014-03-05 LAB — TSH: TSH: 1.674 u[IU]/mL (ref 0.350–4.500)

## 2014-03-05 LAB — LIPID PANEL
CHOL/HDL RATIO: 3.7 ratio
Cholesterol: 193 mg/dL (ref 0–200)
HDL: 52 mg/dL (ref >39)
LDL Cholesterol: 117 mg/dL — ABNORMAL HIGH (ref 0–99)
Triglycerides: 120 mg/dL (ref <150)
VLDL: 24 mg/dL (ref 0–40)

## 2014-03-05 LAB — BASIC METABOLIC PANEL WITH GFR
BUN: 16 mg/dL (ref 6–23)
CALCIUM: 9.8 mg/dL (ref 8.4–10.5)
CHLORIDE: 102 meq/L (ref 96–112)
CO2: 27 meq/L (ref 19–32)
Creat: 0.84 mg/dL (ref 0.50–1.10)
GFR, Est African American: 86 mL/min
GFR, Est Non African American: 75 mL/min
Glucose, Bld: 100 mg/dL — ABNORMAL HIGH (ref 70–99)
Potassium: 4.6 mEq/L (ref 3.5–5.3)
SODIUM: 139 meq/L (ref 135–145)

## 2014-03-05 LAB — VITAMIN B12: Vitamin B-12: 534 pg/mL (ref 211–911)

## 2014-03-05 LAB — HEPATIC FUNCTION PANEL
ALT: 20 U/L (ref 0–35)
AST: 22 U/L (ref 0–37)
Albumin: 4.4 g/dL (ref 3.5–5.2)
Alkaline Phosphatase: 108 U/L (ref 39–117)
BILIRUBIN TOTAL: 0.5 mg/dL (ref 0.2–1.2)
Bilirubin, Direct: 0.1 mg/dL (ref 0.0–0.3)
Indirect Bilirubin: 0.4 mg/dL (ref 0.2–1.2)
Total Protein: 6.9 g/dL (ref 6.0–8.3)

## 2014-03-05 LAB — MAGNESIUM: MAGNESIUM: 1.8 mg/dL (ref 1.5–2.5)

## 2014-03-05 NOTE — Progress Notes (Signed)
Patient ID: Shawna [name], female   DOB: [birth year], 63 y.o.   MRN: [id number]   Annual Screening Comprehensive Examination  This very nice 63 y.o. MWF presents for complete physical.  Patient has been followed for HTN, Diabetes  Prediabetes, Hyperlipidemia, and Vitamin D Deficiency.   Labile HTN predates since 2010 and is being monitored expectantly. Patient's BP has been controlled at home. Today's BP: 126/82 mmHg. Patient denies any cardiac symptoms as chest pain, palpitations, shortness of breath, dizziness or ankle swelling.   Patient's hyperlipidemia is controlled with diet and medications. Patient denies myalgias or other medication SE's. Last cholesterol last visit was 167, triglycerides 63, HDL 51 and LDL 103 in Dec 2014 - at goal.     Patient has prediabetes with A1c 6.1% in Dec 2011 and  with last A1c 6.0% in Dec 2014. Patient denies reactive hypoglycemic symptoms, visual blurring, diabetic polys, or paresthesias.    Patient has Hx/ DCIS Lt Breast in 2006 w/o known recurrence.   Finally, patient has history of Vitamin D Deficiency of 36 in 2008 with last vitamin D 58 in Sept 2014.  Medication Sig  . atorvastatin (LIPITOR) 80 MG tablet Take 80 mg by mouth daily.  . cholecalciferol (VITAMIN D) 1000 UNITS tablet Take 10,000 Units by mouth daily.   . Magnesium 250 MG TABS Take by mouth daily.  . Multiple Vitamin (MULTIVITAMIN) tablet Take 1 tablet by mouth daily.     Allergies  Allergen Reactions  . Nitrofurantoin Monohyd Macro Nausea Only    Past Medical History  Diagnosis Date  . Hypercholesteremia   . 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 Serous cyst of ovary     left  . Osteopenia 03/2009    T score -1.7    Past Surgical History  Procedure Laterality Date  . Breast lumpectomy  2006  . Salpingoophorectomy  1978    left    Family History  Problem Relation Age of Onset  . Hypertension Mother   . Colon cancer Father   . Diabetes Sister   . Heart  disease Sister   . Cancer Sister     ? Type  . Breast cancer Paternal Grandmother     Age 41's    History  Substance Use Topics  . Smoking status: Never Smoker   . Smokeless tobacco: Not on file  . Alcohol Use: No    ROS Constitutional: Denies fever, chills, weight loss/gain, headaches, insomnia, fatigue, night sweats, and change in appetite. Eyes: Denies redness, blurred vision, diplopia, discharge, itchy, watery eyes.  ENT: Denies discharge, congestion, post nasal drip, epistaxis, sore throat, earache, hearing loss, dental pain, Tinnitus, Vertigo, Sinus pain, snoring.  Cardio: Denies chest pain, palpitations, irregular heartbeat, syncope, dyspnea, diaphoresis, orthopnea, PND, claudication, edema Respiratory: denies cough, dyspnea, DOE, pleurisy, hoarseness, laryngitis, wheezing.  Gastrointestinal: Denies dysphagia, heartburn, reflux, water brash, pain, cramps, nausea, vomiting, bloating, diarrhea, constipation, hematemesis, melena, hematochezia, jaundice, hemorrhoids Genitourinary: Denies dysuria, frequency, urgency, nocturia, hesitancy, discharge, hematuria, flank pain Breast:Breast lumps, nipple discharge, bleeding.  Musculoskeletal: Denies arthralgia, myalgia, stiffness, Jt. Swelling, pain, limp, and strain/sprain. Skin: Denies puritis, rash, hives, warts, acne, eczema, changing in skin lesion Neuro: No weakness, tremor, incoordination, spasms, paresthesia, pain Psychiatric: Denies confusion, memory loss, sensory loss Endocrine: Denies change in weight, skin, hair change, nocturia, and paresthesia, diabetic polys, visual blurring, hyper / hypo glycemic episodes.  Heme/Lymph: No excessive bleeding, bruising, enlarged lymph nodes.   Physical Exam   BP  126/82  Pulse 76  Temp 97.3 F   Resp 18  Ht 5' 3.75"   Wt 186 lb   BMI 32.19 kg/m2  General Appearance: Well nourished, in no apparent distress. Eyes: PERRLA, EOMs, conjunctiva no swelling or erythema, normal fundi and  vessels. Sinuses: No frontal/maxillary tenderness ENT/Mouth: EACs patent / TMs  nl. Nares clear without erythema, swelling, mucoid exudates. Oral hygiene is good. No erythema, swelling, or exudate. Tongue normal, non-obstructing. Tonsils not swollen or erythematous. Hearing normal.  Neck: Supple, thyroid normal. No bruits, nodes or JVD. Respiratory: Respiratory effort normal.  BS equal and clear bilateral without rales, rhonci, wheezing or stridor. Cardio: Heart sounds are normal with regular rate and rhythm and no murmurs, rubs or gallops. Peripheral pulses are normal and equal bilaterally without edema. No aortic or femoral bruits. Chest: symmetric with normal excursions and percussion. Breasts: Symmetric, without lumps, nipple discharge, retractions, or fibrocystic changes.  Abdomen: Flat, soft, with bowl sounds. Nontender, no guarding, rebound, hernias, masses, or organomegaly.  Lymphatics: Non tender without lymphadenopathy.  Musculoskeletal: Full ROM all peripheral extremities, joint stability, 5/5 strength, and normal gait. Skin: Warm and dry without rashes, lesions, cyanosis, clubbing or  ecchymosis.  Neuro: Cranial nerves intact, reflexes equal bilaterally. Normal muscle tone, no cerebellar symptoms. Sensation intact.  Pysch: Awake and oriented X 3, normal affect, Insight and Judgment appropriate.   Assessment and Plan  1. Annual Screening Examination 2. Hypertension, labile   3. Hyperlipidemia 4. Pre Diabetes 5. Vitamin D Deficiency 6. Lt Breast Ca, DCIS (2006)  Continue prudent diet as discussed, weight control, BP monitoring, regular exercise, and medications. Discussed med's effects and SE's. Screening labs and tests as requested with regular follow-up as recommended.

## 2014-03-05 NOTE — Patient Instructions (Signed)

## 2014-03-06 LAB — HEPATITIS C ANTIBODY: HCV AB: NEGATIVE

## 2014-03-06 LAB — MICROALBUMIN / CREATININE URINE RATIO
Creatinine, Urine: 171.5 mg/dL
MICROALB/CREAT RATIO: 7.5 mg/g (ref 0.0–30.0)
Microalb, Ur: 1.28 mg/dL (ref 0.00–1.89)

## 2014-03-06 LAB — VITAMIN D 25 HYDROXY (VIT D DEFICIENCY, FRACTURES): VIT D 25 HYDROXY: 66 ng/mL (ref 30–89)

## 2014-03-06 LAB — URINALYSIS, MICROSCOPIC ONLY
BACTERIA UA: NONE SEEN
CRYSTALS: NONE SEEN
Casts: NONE SEEN
Squamous Epithelial / LPF: NONE SEEN

## 2014-03-06 LAB — INSULIN, FASTING: Insulin fasting, serum: 28 u[IU]/mL (ref 3–28)

## 2014-03-06 LAB — RPR

## 2014-03-06 LAB — HEPATITIS B SURFACE ANTIBODY,QUALITATIVE: Hep B S Ab: POSITIVE — AB

## 2014-03-06 LAB — HEPATITIS B CORE ANTIBODY, TOTAL: Hep B Core Total Ab: NONREACTIVE

## 2014-03-06 LAB — HEPATITIS A ANTIBODY, TOTAL: Hep A Total Ab: NONREACTIVE

## 2014-03-06 LAB — HIV ANTIBODY (ROUTINE TESTING W REFLEX): HIV: NONREACTIVE

## 2014-03-07 LAB — HEPATITIS B E ANTIBODY: Hepatitis Be Antibody: NONREACTIVE

## 2014-03-09 LAB — TB SKIN TEST
Induration: 0 mm
TB SKIN TEST: NEGATIVE

## 2014-06-05 ENCOUNTER — Ambulatory Visit (INDEPENDENT_AMBULATORY_CARE_PROVIDER_SITE_OTHER): Payer: BC Managed Care – PPO | Admitting: Physician Assistant

## 2014-06-05 ENCOUNTER — Encounter: Payer: Self-pay | Admitting: Physician Assistant

## 2014-06-05 VITALS — BP 110/72 | HR 84 | Temp 97.9°F | Resp 16 | Ht 63.0 in | Wt 184.0 lb

## 2014-06-05 DIAGNOSIS — I10 Essential (primary) hypertension: Secondary | ICD-10-CM

## 2014-06-05 DIAGNOSIS — E559 Vitamin D deficiency, unspecified: Secondary | ICD-10-CM

## 2014-06-05 DIAGNOSIS — Z79899 Other long term (current) drug therapy: Secondary | ICD-10-CM

## 2014-06-05 DIAGNOSIS — R7309 Other abnormal glucose: Secondary | ICD-10-CM

## 2014-06-05 DIAGNOSIS — E782 Mixed hyperlipidemia: Secondary | ICD-10-CM

## 2014-06-05 LAB — CBC WITH DIFFERENTIAL/PLATELET
Basophils Absolute: 0 10*3/uL (ref 0.0–0.1)
Basophils Relative: 0 % (ref 0–1)
Eosinophils Absolute: 0.1 10*3/uL (ref 0.0–0.7)
Eosinophils Relative: 1 % (ref 0–5)
HCT: 40.9 % (ref 36.0–46.0)
Hemoglobin: 14.2 g/dL (ref 12.0–15.0)
LYMPHS ABS: 2.8 10*3/uL (ref 0.7–4.0)
LYMPHS PCT: 34 % (ref 12–46)
MCH: 30.3 pg (ref 26.0–34.0)
MCHC: 34.7 g/dL (ref 30.0–36.0)
MCV: 87.4 fL (ref 78.0–100.0)
Monocytes Absolute: 0.6 10*3/uL (ref 0.1–1.0)
Monocytes Relative: 7 % (ref 3–12)
NEUTROS PCT: 58 % (ref 43–77)
Neutro Abs: 4.8 10*3/uL (ref 1.7–7.7)
PLATELETS: 226 10*3/uL (ref 150–400)
RBC: 4.68 MIL/uL (ref 3.87–5.11)
RDW: 14.2 % (ref 11.5–15.5)
WBC: 8.2 10*3/uL (ref 4.0–10.5)

## 2014-06-05 LAB — HEMOGLOBIN A1C
HEMOGLOBIN A1C: 5.9 % — AB (ref <5.7)
Mean Plasma Glucose: 123 mg/dL — ABNORMAL HIGH (ref <117)

## 2014-06-05 NOTE — Progress Notes (Signed)
Assessment and Plan:  Hypertension: Continue medication, monitor blood pressure at home. Continue DASH diet. Cholesterol: Continue diet and exercise. Check cholesterol.  Pre-diabetes-Continue diet and exercise. Check A1C Vitamin D Def- check level and continue medications.  Obesity with co morbidities- long discussion about weight loss, diet, and exercise   Continue diet and meds as discussed. Further disposition pending results of labs.  HPI 63 y.o. female  presents for 3 month follow up with hypertension, hyperlipidemia, prediabetes and vitamin D. Her blood pressure has been controlled at home, today their BP is BP: 110/72 mmHg She does workout, yard work and walking on the treadmill. She denies chest pain, shortness of breath, dizziness.  She is on cholesterol medication and denies myalgias. Her cholesterol is at goal. The cholesterol last visit was:   Lab Results  Component Value Date   CHOL 193 03/05/2014   HDL 52 03/05/2014   LDLCALC 117* 03/05/2014   TRIG 120 03/05/2014   CHOLHDL 3.7 03/05/2014   She has been working on diet and exercise for prediabetes, and denies paresthesia of the feet, polydipsia and polyuria. Last A1C in the office was:  Lab Results  Component Value Date   HGBA1C 6.2* 03/05/2014   Patient is on Vitamin D supplement.   Lab Results  Component Value Date   VD25OH 66 03/05/2014     She BMI is Body mass index is 32.6 kg/(m^2)., she is working on diet and exercise and has done well. She has eat to live and has been increasing veggies and beans.  Wt Readings from Last 3 Encounters:  06/05/14 184 lb (83.462 kg)  03/05/14 186 lb (84.369 kg)  01/19/14 190 lb (86.183 kg)      Current Medications:  Current Outpatient Prescriptions on File Prior to Visit  Medication Sig Dispense Refill  . atorvastatin (LIPITOR) 80 MG tablet Take 80 mg by mouth daily.      . cholecalciferol (VITAMIN D) 1000 UNITS tablet Take 10,000 Units by mouth daily.       . Magnesium 250 MG  TABS Take by mouth daily.      . Multiple Vitamin (MULTIVITAMIN) tablet Take 1 tablet by mouth daily.         No current facility-administered medications on file prior to visit.   Medical History:  Past Medical History  Diagnosis Date  . Hypercholesteremia   . 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 Serous cyst of ovary     left  . Osteopenia 03/2009    T score -1.7   Allergies:  Allergies  Allergen Reactions  . Nitrofurantoin Monohyd Macro Nausea Only     Review of Systems: [X]  = complains of  [ ]  = denies  General: Fatigue [ ]  Fever [ ]  Chills [ ]  Weakness [ ]   Insomnia [ ]  Eyes: Redness [ ]  Blurred vision [ ]  Diplopia [ ]   ENT: Congestion [ ]  Sinus Pain [ ]  Post Nasal Drip [ ]  Sore Throat [ ]  Earache [ ]   Cardiac: Chest pain/pressure [ ]  SOB [ ]  Orthopnea [ ]   Palpitations [ ]   Paroxysmal nocturnal dyspnea[ ]  Claudication [ ]  Edema [ ]   Pulmonary: Cough [ ]  Wheezing[ ]   SOB [ ]   Snoring [ ]   GI: Nausea [ ]  Vomiting[ ]  Dysphagia[ ]  Heartburn[ ]  Abdominal pain [ ]  Constipation [ ] ; Diarrhea [ ] ; BRBPR [ ]  Melena[ ]  GU: Hematuria[ ]  Dysuria [ ]  Nocturia[ ]  Urgency [ ]   Hesitancy [ ]  Discharge [ ]  Neuro: Headaches[ ]  Vertigo[ ]  Paresthesias[ ]  Spasm [ ]  Speech changes [ ]  Incoordination [ ]   Ortho: Arthritis [ ]  Joint pain [ ]  Muscle pain [ ]  Joint swelling [ ]  Back Pain [ ]  Skin:  Rash [ ]   Pruritis [ ]  Change in skin lesion [ ]   Psych: Depression[ ]  Anxiety[ ]  Confusion [ ]  Memory loss [ ]   Heme/Lypmh: Bleeding [ ]  Bruising [ ]  Enlarged lymph nodes [ ]   Endocrine: Visual blurring [ ]  Paresthesia [ ]  Polyuria [ ]  Polydypsea [ ]    Heat/cold intolerance [ ]  Hypoglycemia [ ]   Family history- Review and unchanged Social history- Review and unchanged Physical Exam: BP 110/72  Pulse 84  Temp(Src) 97.9 F (36.6 C)  Resp 16  Ht 5\' 3"  (1.6 m)  Wt 184 lb (83.462 kg)  BMI 32.60 kg/m2 Wt Readings from Last 3 Encounters:  06/05/14 184 lb (83.462 kg)  03/05/14 186  lb (84.369 kg)  01/19/14 190 lb (86.183 kg)   General Appearance: Well nourished, in no apparent distress. Eyes: PERRLA, EOMs, conjunctiva no swelling or erythema Sinuses: No Frontal/maxillary tenderness ENT/Mouth: Ext aud canals clear, TMs without erythema, bulging. No erythema, swelling, or exudate on post pharynx.  Tonsils not swollen or erythematous. Hearing normal.  Neck: Supple, thyroid normal.  Respiratory: Respiratory effort normal, BS equal bilaterally without rales, rhonchi, wheezing or stridor.  Cardio: RRR with no MRGs. Brisk peripheral pulses without edema.  Abdomen: Soft, + BS.  Non tender, no guarding, rebound, hernias, masses. Lymphatics: Non tender without lymphadenopathy.  Musculoskeletal: Full ROM, 5/5 strength, normal gait.  Skin: Warm, dry without rashes, lesions, ecchymosis.  Neuro: Cranial nerves intact. Normal muscle tone, no cerebellar symptoms. Sensation intact.  Psych: Awake and oriented X 3, normal affect, Insight and Judgment appropriate.    Vicie Mutters 11:34 AM

## 2014-06-05 NOTE — Patient Instructions (Signed)

## 2014-06-06 LAB — BASIC METABOLIC PANEL WITH GFR
BUN: 16 mg/dL (ref 6–23)
CHLORIDE: 102 meq/L (ref 96–112)
CO2: 27 meq/L (ref 19–32)
CREATININE: 0.77 mg/dL (ref 0.50–1.10)
Calcium: 9.8 mg/dL (ref 8.4–10.5)
GFR, Est African American: >89 mL/min
GFR, Est Non African American: 83 mL/min
Glucose, Bld: 95 mg/dL (ref 70–99)
Potassium: 4.8 mEq/L (ref 3.5–5.3)
Sodium: 140 mEq/L (ref 135–145)

## 2014-06-06 LAB — LIPID PANEL
Cholesterol: 203 mg/dL — ABNORMAL HIGH (ref 0–200)
HDL: 53 mg/dL (ref >39)
LDL Cholesterol: 129 mg/dL — ABNORMAL HIGH (ref 0–99)
Total CHOL/HDL Ratio: 3.8 Ratio
Triglycerides: 103 mg/dL (ref <150)
VLDL: 21 mg/dL (ref 0–40)

## 2014-06-06 LAB — HEPATIC FUNCTION PANEL
ALK PHOS: 117 U/L (ref 39–117)
ALT: 18 U/L (ref 0–35)
AST: 19 U/L (ref 0–37)
Albumin: 4.5 g/dL (ref 3.5–5.2)
BILIRUBIN INDIRECT: 0.4 mg/dL (ref 0.2–1.2)
BILIRUBIN TOTAL: 0.5 mg/dL (ref 0.2–1.2)
Bilirubin, Direct: 0.1 mg/dL (ref 0.0–0.3)
Total Protein: 6.7 g/dL (ref 6.0–8.3)

## 2014-06-06 LAB — TSH: TSH: 1.811 u[IU]/mL (ref 0.350–4.500)

## 2014-06-06 LAB — INSULIN, FASTING: Insulin fasting, serum: 20 u[IU]/mL (ref 3–28)

## 2014-06-06 LAB — MAGNESIUM: MAGNESIUM: 1.8 mg/dL (ref 1.5–2.5)

## 2014-06-06 LAB — VITAMIN D 25 HYDROXY (VIT D DEFICIENCY, FRACTURES): Vit D, 25-Hydroxy: 70 ng/mL (ref 30–89)

## 2014-07-12 ENCOUNTER — Other Ambulatory Visit: Payer: Self-pay | Admitting: Internal Medicine

## 2014-07-19 ENCOUNTER — Telehealth: Payer: Self-pay | Admitting: Internal Medicine

## 2014-07-19 NOTE — Telephone Encounter (Signed)
PATIENT CALLED TO ADVISE THAT PAIN IN ARCH OF FOOT SINCE SHE BEGAN A  WALKING EXERCISE PROGRAM, GOING TO DR  DUDA, ORTHO TODAY. 07-19-14    Thank you, Katrina Judeth Horn [facility] [phone] Fax [phone]

## 2014-09-05 ENCOUNTER — Encounter: Payer: Self-pay | Admitting: Internal Medicine

## 2014-09-05 ENCOUNTER — Ambulatory Visit (INDEPENDENT_AMBULATORY_CARE_PROVIDER_SITE_OTHER): Payer: BC Managed Care – PPO | Admitting: Internal Medicine

## 2014-09-05 VITALS — BP 124/80 | HR 76 | Temp 97.3°F | Resp 16 | Ht 63.0 in | Wt 184.4 lb

## 2014-09-05 DIAGNOSIS — E782 Mixed hyperlipidemia: Secondary | ICD-10-CM

## 2014-09-05 DIAGNOSIS — I10 Essential (primary) hypertension: Secondary | ICD-10-CM

## 2014-09-05 DIAGNOSIS — Z23 Encounter for immunization: Secondary | ICD-10-CM

## 2014-09-05 DIAGNOSIS — R7309 Other abnormal glucose: Secondary | ICD-10-CM

## 2014-09-05 DIAGNOSIS — Z79899 Other long term (current) drug therapy: Secondary | ICD-10-CM

## 2014-09-05 DIAGNOSIS — E559 Vitamin D deficiency, unspecified: Secondary | ICD-10-CM

## 2014-09-05 DIAGNOSIS — E669 Obesity, unspecified: Secondary | ICD-10-CM | POA: Insufficient documentation

## 2014-09-05 LAB — CBC WITH DIFFERENTIAL/PLATELET
BASOS PCT: 0 % (ref 0–1)
Basophils Absolute: 0 10*3/uL (ref 0.0–0.1)
Eosinophils Absolute: 0.1 10*3/uL (ref 0.0–0.7)
Eosinophils Relative: 1 % (ref 0–5)
HEMATOCRIT: 38.5 % (ref 36.0–46.0)
HEMOGLOBIN: 13.3 g/dL (ref 12.0–15.0)
LYMPHS ABS: 2.3 10*3/uL (ref 0.7–4.0)
Lymphocytes Relative: 31 % (ref 12–46)
MCH: 29.4 pg (ref 26.0–34.0)
MCHC: 34.5 g/dL (ref 30.0–36.0)
MCV: 85 fL (ref 78.0–100.0)
MONOS PCT: 7 % (ref 3–12)
Monocytes Absolute: 0.5 10*3/uL (ref 0.1–1.0)
NEUTROS ABS: 4.5 10*3/uL (ref 1.7–7.7)
Neutrophils Relative %: 61 % (ref 43–77)
Platelets: 232 10*3/uL (ref 150–400)
RBC: 4.53 MIL/uL (ref 3.87–5.11)
RDW: 14.3 % (ref 11.5–15.5)
WBC: 7.3 10*3/uL (ref 4.0–10.5)

## 2014-09-05 NOTE — Progress Notes (Signed)
Patient ID: Shawna [name], female   DOB: [birth year], 63 y.o.   MRN: [id number]   This very nice 63 y.o.MWF presents for 3 month follow up with Hypertension, Hyperlipidemia, Pre-Diabetes and Vitamin D Deficiency.    Patient is treated for labile HTN with prn HCTZ and has been monitored expectantly & BP has been controlled and today's BP is 124/80 mmHg. Patient has had no complaints of any cardiac type chest pain, palpitations, dyspnea/orthopnea/PND, dizziness, claudication, but doe shave hx/o dependent edema.   Hyperlipidemia is controlled with diet & meds. Patient denies myalgias or other med SE's. Last Lipids were  Cholesterol, Total 203*; HDL Cholesterol by NMR 53; LDL (calc) 129*; Triglycerides 103 - at goal on 06/05/2014.   Also, the patient has history of Morbid Obesity (BMI 32.67) and consequent PreDiabetes and has had no symptoms of reactive hypoglycemia, diabetic polys, paresthesias or visual blurring.  Last A1c was  5.9% on 06/05/2014.    Further, the patient also has history of Vitamin D Deficiency (36 in 2008) and supplements vitamin D without any suspected side-effects. Last vitamin D was  70 on 06/05/2014.   Medication List   atorvastatin 80 MG tablet  Commonly known as:  LIPITOR  TAKE ONE TABLET BY MOUTH EVERY DAY FOR CHOLESTEROL     Magnesium 250 MG Tabs  Take by mouth daily.     multivitamin tablet  Take 1 tablet by mouth daily.     VITAMIN D PO  Take 2,000 Units by mouth. Takes 8000 iu daily     Allergies  Allergen Reactions  . Nitrofurantoin Monohyd Macro Nausea Only   PMHx:   Past Medical History  Diagnosis Date  . Hypercholesteremia   . 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 Serous cyst of ovary     left  . Osteopenia 03/2009    T score -1.7   FHx:    Reviewed / unchanged  SHx:    Reviewed / unchanged   Systems Review:  Constitutional: Denies fever, chills, wt changes, headaches, insomnia, fatigue, night sweats, change in appetite. Eyes:  Denies redness, blurred vision, diplopia, discharge, itchy, watery eyes.  ENT: Denies discharge, congestion, post nasal drip, epistaxis, sore throat, earache, hearing loss, dental pain, tinnitus, vertigo, sinus pain, snoring.  CV: Denies chest pain, palpitations, irregular heartbeat, syncope, dyspnea, diaphoresis, orthopnea, PND, claudication or edema. Respiratory: denies cough, dyspnea, DOE, pleurisy, hoarseness, laryngitis, wheezing.  Gastrointestinal: Denies dysphagia, odynophagia, heartburn, reflux, water brash, abdominal pain or cramps, nausea, vomiting, bloating, diarrhea, constipation, hematemesis, melena, hematochezia  or hemorrhoids. Genitourinary: Denies dysuria, frequency, urgency, nocturia, hesitancy, discharge, hematuria or flank pain. Musculoskeletal: Denies arthralgias, myalgias, stiffness, jt. swelling, pain, limping or strain/sprain.  Skin: Denies pruritus, rash, hives, warts, acne, eczema or change in skin lesion(s). Neuro: No weakness, tremor, incoordination, spasms, paresthesia or pain. Psychiatric: Denies confusion, memory loss or sensory loss. Endo: Denies change in weight, skin or hair change.  Heme/Lymph: No excessive bleeding, bruising or enlarged lymph nodes.  Exam:  BP 124/80  Pulse 76  Temp 97.3 F   Resp 16  Ht 5\' 3"    Wt 184 lb 6.4 oz   BMI 32.67   Appears well nourished and in no distress. Eyes: PERRLA, EOMs, conjunctiva no swelling or erythema. Sinuses: No frontal/maxillary tenderness ENT/Mouth: EAC's clear, TM's nl w/o erythema, bulging. Nares clear w/o erythema, swelling, exudates. Oropharynx clear without erythema or exudates. Oral hygiene is good. Tongue normal, non obstructing. Hearing intact.  Neck:  Supple. Thyroid nl. Car 2+/2+ without bruits, nodes or JVD. Chest: Respirations nl with BS clear & equal w/o rales, rhonchi, wheezing or stridor.  Cor: Heart sounds normal w/ regular rate and rhythm without sig. murmurs, gallops, clicks, or rubs.  Peripheral pulses normal and equal  without edema.   Lymphatics: Unremarkable.  Musculoskeletal: Full ROM all peripheral extremities, joint stability, 5/5 strength, and normal gait.  Skin: Warm, dry without exposed rashes, lesions or ecchymosis apparent.  Neuro: Cranial nerves intact, reflexes equal bilaterally. Sensory-motor testing grossly intact. Tendon reflexes grossly intact.  Pysch: Alert & oriented x 3.  Insight and judgement nl & appropriate. No ideations.  Assessment and Plan:  1. Hypertension, labile  - Continue monitor blood pressure at home. Continue diet/meds same.  2. Hyperlipidemia - Continue diet/meds, exercise,& lifestyle modifications. Continue monitor periodic cholesterol/liver & renal functions   3. Pre-Diabetes - Continue diet, exercise, lifestyle modifications. Monitor appropriate labs.  4. Vitamin D Deficiency - Continue supplementation.  5. Hx/o Breast Cancer (2008) -   Recommended regular exercise, BP monitoring, weight control, and discussed med and SE's. Recommended labs to assess and monitor clinical status. Further disposition pending results of labs.

## 2014-09-05 NOTE — Patient Instructions (Signed)

## 2014-09-06 LAB — LIPID PANEL
CHOL/HDL RATIO: 3.2 ratio
Cholesterol: 162 mg/dL (ref 0–200)
HDL: 51 mg/dL (ref >39)
LDL CALC: 92 mg/dL (ref 0–99)
Triglycerides: 94 mg/dL (ref <150)
VLDL: 19 mg/dL (ref 0–40)

## 2014-09-06 LAB — BASIC METABOLIC PANEL WITH GFR
BUN: 21 mg/dL (ref 6–23)
CO2: 21 meq/L (ref 19–32)
Calcium: 9.5 mg/dL (ref 8.4–10.5)
Chloride: 105 mEq/L (ref 96–112)
Creat: 0.76 mg/dL (ref 0.50–1.10)
GFR, Est African American: >89 mL/min
GFR, Est Non African American: 84 mL/min
GLUCOSE: 102 mg/dL — AB (ref 70–99)
POTASSIUM: 4.3 meq/L (ref 3.5–5.3)
Sodium: 139 mEq/L (ref 135–145)

## 2014-09-06 LAB — HEPATIC FUNCTION PANEL
ALT: 13 U/L (ref 0–35)
AST: 18 U/L (ref 0–37)
Albumin: 4.3 g/dL (ref 3.5–5.2)
Alkaline Phosphatase: 106 U/L (ref 39–117)
BILIRUBIN DIRECT: 0.1 mg/dL (ref 0.0–0.3)
BILIRUBIN INDIRECT: 0.4 mg/dL (ref 0.2–1.2)
TOTAL PROTEIN: 6.6 g/dL (ref 6.0–8.3)
Total Bilirubin: 0.5 mg/dL (ref 0.2–1.2)

## 2014-09-06 LAB — VITAMIN D 25 HYDROXY (VIT D DEFICIENCY, FRACTURES): Vit D, 25-Hydroxy: 79 ng/mL (ref 30–89)

## 2014-09-06 LAB — HEMOGLOBIN A1C
HEMOGLOBIN A1C: 6 % — AB (ref <5.7)
MEAN PLASMA GLUCOSE: 126 mg/dL — AB (ref <117)

## 2014-09-06 LAB — TSH: TSH: 1.529 u[IU]/mL (ref 0.350–4.500)

## 2014-09-06 LAB — INSULIN, FASTING: Insulin fasting, serum: 16.4 u[IU]/mL (ref 2.0–19.6)

## 2014-09-06 LAB — MAGNESIUM: Magnesium: 1.7 mg/dL (ref 1.5–2.5)

## 2014-09-24 ENCOUNTER — Other Ambulatory Visit: Payer: Self-pay

## 2014-09-24 DIAGNOSIS — Z853 Personal history of malignant neoplasm of breast: Secondary | ICD-10-CM

## 2014-09-24 DIAGNOSIS — Z1231 Encounter for screening mammogram for malignant neoplasm of breast: Secondary | ICD-10-CM

## 2014-10-29 ENCOUNTER — Ambulatory Visit
Admission: RE | Admit: 2014-10-29 | Discharge: 2014-10-29 | Disposition: A | Discharge status: Home / Self Care | Payer: BC Managed Care – PPO | Source: Ambulatory Visit

## 2014-10-29 DIAGNOSIS — Z1231 Encounter for screening mammogram for malignant neoplasm of breast: Secondary | ICD-10-CM

## 2014-10-29 DIAGNOSIS — Z853 Personal history of malignant neoplasm of breast: Secondary | ICD-10-CM

## 2014-12-05 ENCOUNTER — Encounter: Payer: Self-pay | Admitting: Physician Assistant

## 2014-12-05 ENCOUNTER — Ambulatory Visit (INDEPENDENT_AMBULATORY_CARE_PROVIDER_SITE_OTHER): Payer: BC Managed Care – PPO | Admitting: Physician Assistant

## 2014-12-05 VITALS — BP 138/78 | HR 76 | Temp 97.7°F | Resp 16 | Ht 63.0 in | Wt 184.0 lb

## 2014-12-05 DIAGNOSIS — I10 Essential (primary) hypertension: Secondary | ICD-10-CM

## 2014-12-05 DIAGNOSIS — R7303 Prediabetes: Secondary | ICD-10-CM

## 2014-12-05 DIAGNOSIS — E559 Vitamin D deficiency, unspecified: Secondary | ICD-10-CM

## 2014-12-05 DIAGNOSIS — R7309 Other abnormal glucose: Secondary | ICD-10-CM

## 2014-12-05 DIAGNOSIS — E782 Mixed hyperlipidemia: Secondary | ICD-10-CM

## 2014-12-05 DIAGNOSIS — Z79899 Other long term (current) drug therapy: Secondary | ICD-10-CM

## 2014-12-05 LAB — CBC WITH DIFFERENTIAL/PLATELET
BASOS ABS: 0 10*3/uL (ref 0.0–0.1)
Basophils Relative: 0 % (ref 0–1)
Eosinophils Absolute: 0.2 10*3/uL (ref 0.0–0.7)
Eosinophils Relative: 2 % (ref 0–5)
HEMATOCRIT: 39.5 % (ref 36.0–46.0)
HEMOGLOBIN: 13.4 g/dL (ref 12.0–15.0)
LYMPHS ABS: 3.1 10*3/uL (ref 0.7–4.0)
Lymphocytes Relative: 35 % (ref 12–46)
MCH: 29.5 pg (ref 26.0–34.0)
MCHC: 33.9 g/dL (ref 30.0–36.0)
MCV: 86.8 fL (ref 78.0–100.0)
MONO ABS: 0.4 10*3/uL (ref 0.1–1.0)
MPV: 9.9 fL (ref 8.6–12.4)
Monocytes Relative: 5 % (ref 3–12)
Neutro Abs: 5.2 10*3/uL (ref 1.7–7.7)
Neutrophils Relative %: 58 % (ref 43–77)
Platelets: 206 10*3/uL (ref 150–400)
RBC: 4.55 MIL/uL (ref 3.87–5.11)
RDW: 14.2 % (ref 11.5–15.5)
WBC: 8.9 10*3/uL (ref 4.0–10.5)

## 2014-12-05 NOTE — Patient Instructions (Signed)
We want weight loss that will last so you should lose 1-2 pounds a week.  THAT IS IT! Please pick THREE things a month to change. Once it is a habit check off the item. Then pick another three items off the list to become habits.  If you are already doing a habit on the list GREAT!  Cross that item off! o Don't drink your calories. Ie, alcohol, soda, fruit juice, and sweet tea.  o Drink more water. Drink a glass when you feel hungry or before each meal.  o Eat breakfast - Complex carb and protein (likeDannon light and fit yogurt, oatmeal, fruit, eggs, turkey bacon). o Measure your cereal.  Eat no more than one cup a day. (ie Kashi) o Eat an apple a day. o Add a vegetable a day. o Try a new vegetable a month. o Use Pam! Stop using oil or butter to cook. o Don't finish your plate or use smaller plates. o Share your dessert. o Eat sugar free Jello for dessert or frozen grapes. o Don't eat 2-3 hours before bed. o Switch to whole wheat bread, pasta, and brown rice. o Make healthier choices when you eat out. No fries! o Pick baked chicken, NOT fried. o Don't forget to SLOW DOWN when you eat. It is not going anywhere.  o Take the stairs. o Park far away in the parking lot o Lift soup cans (or weights) for 10 minutes while watching TV. o Walk at work for 10 minutes during break. o Walk outside 1 time a week with your friend, kids, dog, or significant other. o Start a walking group at church. o Walk the mall as much as you can tolerate.  o Keep a food diary. o Weigh yourself daily. o Walk for 15 minutes 3 days per week. o Cook at home more often and eat out less.  If life happens and you go back to old habits, it is okay.  Just start over. You can do it!   If you experience chest pain, get short of breath, or tired during the exercise, please stop immediately and inform your doctor.      Bad carbs also include fruit juice, alcohol, and sweet tea. These are empty calories that do not signal  to your brain that you are full.   Please remember the good carbs are still carbs which convert into sugar. So please measure them out no more than 1/2-1 cup of rice, oatmeal, pasta, and beans  Veggies are however free foods! Pile them on.   Not all fruit is created equal. Please see the list below, the fruit at the bottom is higher in sugars than the fruit at the top. Please avoid all dried fruits.     

## 2014-12-05 NOTE — Progress Notes (Signed)
Assessment and Plan:  Hypertension: Continue medication, monitor blood pressure at home. Continue DASH diet.  Reminder to go to the ER if any CP, SOB, nausea, dizziness, severe HA, changes vision/speech, left arm numbness and tingling, and jaw pain. Cholesterol: Continue diet and exercise. Check cholesterol.  Pre-diabetes-Continue diet and exercise. Check A1C Obesity with co morbidities- long discussion about weight loss, diet, and exercise Vitamin D Def- check level and continue medications.   Continue diet and meds as discussed. Further disposition pending results of labs.  HPI 63 y.o. female  presents for 3 month follow up with hypertension, hyperlipidemia, prediabetes and vitamin D. Her blood pressure has been controlled at home, today their BP is BP: 138/78 mmHg She does workout. She denies chest pain, shortness of breath, dizziness.  She is on cholesterol medication, lipitor 80mg  1/2 pill 4 days a week and denies myalgias. Her cholesterol is at goal. The cholesterol last visit was:   Lab Results  Component Value Date   CHOL 162 09/05/2014   HDL 51 09/05/2014   LDLCALC 92 09/05/2014   TRIG 94 09/05/2014   CHOLHDL 3.2 09/05/2014   She has been working on diet and exercise for prediabetes, and denies paresthesia of the feet, polydipsia, polyuria and visual disturbances. Last A1C in the office was:  Lab Results  Component Value Date   HGBA1C 6.0* 09/05/2014  Patient is on Vitamin D supplement, 8000 IU daily.   Lab Results  Component Value Date   VD25OH 79 09/05/2014  BMI is Body mass index is 32.6 kg/(m^2)., she is working on diet and exercise. Wt Readings from Last 3 Encounters:  12/05/14 184 lb (83.462 kg)  09/05/14 184 lb 6.4 oz (83.643 kg)  06/05/14 184 lb (83.462 kg)   Current Medications:  Current Outpatient Prescriptions on File Prior to Visit  Medication Sig Dispense Refill  . atorvastatin (LIPITOR) 80 MG tablet TAKE ONE TABLET BY MOUTH EVERY DAY FOR CHOLESTEROL 30  tablet prn  . Cholecalciferol (VITAMIN D PO) Take 2,000 Units by mouth. Takes 8000 iu daily    . Magnesium 250 MG TABS Take by mouth daily.    . Multiple Vitamin (MULTIVITAMIN) tablet Take 1 tablet by mouth daily.       No current facility-administered medications on file prior to visit.   Medical History:  Past Medical History  Diagnosis Date  . Hypercholesteremia   . 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 Serous cyst of ovary     left  . Osteopenia 03/2009    T score -1.7   Allergies:  Allergies  Allergen Reactions  . Nitrofurantoin Monohyd Macro Nausea Only    Review of Systems:  Review of Systems  Constitutional: Negative.   HENT: Negative.   Eyes: Negative.   Respiratory: Negative.   Cardiovascular: Negative.   Gastrointestinal: Negative.   Genitourinary: Negative.   Musculoskeletal: Negative.   Skin: Negative.   Neurological: Negative.   Endo/Heme/Allergies: Negative.   Psychiatric/Behavioral: Negative.     Family history- Review and unchanged Social history- Review and unchanged Physical Exam: BP 138/78 mmHg  Pulse 76  Temp(Src) 97.7 F (36.5 C)  Resp 16  Ht 5\' 3"  (1.6 m)  Wt 184 lb (83.462 kg)  BMI 32.60 kg/m2 Wt Readings from Last 3 Encounters:  12/05/14 184 lb (83.462 kg)  09/05/14 184 lb 6.4 oz (83.643 kg)  06/05/14 184 lb (83.462 kg)   General Appearance: Well nourished, in no apparent distress. Eyes: PERRLA,  EOMs, conjunctiva no swelling or erythema Sinuses: No Frontal/maxillary tenderness ENT/Mouth: Ext aud canals clear, TMs without erythema, bulging. No erythema, swelling, or exudate on post pharynx.  Tonsils not swollen or erythematous. Hearing normal.  Neck: Supple, thyroid normal.  Respiratory: Respiratory effort normal, BS equal bilaterally without rales, rhonchi, wheezing or stridor.  Cardio: RRR with no MRGs. Brisk peripheral pulses without edema.  Abdomen: Soft, + BS.  Non tender, no guarding, rebound, hernias,  masses. Lymphatics: Non tender without lymphadenopathy.  Musculoskeletal: Full ROM, 5/5 strength, normal gait.  Skin: Warm, dry without rashes, lesions, ecchymosis.  Neuro: Cranial nerves intact. Normal muscle tone, no cerebellar symptoms. Sensation intact.  Psych: Awake and oriented X 3, normal affect, Insight and Judgment appropriate.    Vicie Mutters, PA-C 3:45 PM [facility]

## 2014-12-06 LAB — HEMOGLOBIN A1C
HEMOGLOBIN A1C: 6.1 % — AB (ref <5.7)
Mean Plasma Glucose: 128 mg/dL — ABNORMAL HIGH (ref <117)

## 2014-12-06 LAB — HEPATIC FUNCTION PANEL
ALT: 16 U/L (ref 0–35)
AST: 18 U/L (ref 0–37)
Albumin: 3.9 g/dL (ref 3.5–5.2)
Alkaline Phosphatase: 90 U/L (ref 39–117)
BILIRUBIN DIRECT: 0.1 mg/dL (ref 0.0–0.3)
BILIRUBIN INDIRECT: 0.3 mg/dL (ref 0.2–1.2)
TOTAL PROTEIN: 6.6 g/dL (ref 6.0–8.3)
Total Bilirubin: 0.4 mg/dL (ref 0.2–1.2)

## 2014-12-06 LAB — BASIC METABOLIC PANEL WITH GFR
BUN: 18 mg/dL (ref 6–23)
CALCIUM: 9 mg/dL (ref 8.4–10.5)
CHLORIDE: 105 meq/L (ref 96–112)
CO2: 24 meq/L (ref 19–32)
CREATININE: 0.71 mg/dL (ref 0.50–1.10)
GFR, Est African American: >89 mL/min
Glucose, Bld: 79 mg/dL (ref 70–99)
Potassium: 4 mEq/L (ref 3.5–5.3)
Sodium: 140 mEq/L (ref 135–145)

## 2014-12-06 LAB — INSULIN, FASTING: INSULIN FASTING, SERUM: 11.4 u[IU]/mL (ref 2.0–19.6)

## 2014-12-06 LAB — LIPID PANEL
Cholesterol: 178 mg/dL (ref 0–200)
HDL: 49 mg/dL (ref >39)
LDL Cholesterol: 108 mg/dL — ABNORMAL HIGH (ref 0–99)
Total CHOL/HDL Ratio: 3.6 Ratio
Triglycerides: 107 mg/dL (ref <150)
VLDL: 21 mg/dL (ref 0–40)

## 2014-12-06 LAB — VITAMIN D 25 HYDROXY (VIT D DEFICIENCY, FRACTURES): VIT D 25 HYDROXY: 53 ng/mL (ref 30–100)

## 2014-12-06 LAB — MAGNESIUM: Magnesium: 2 mg/dL (ref 1.5–2.5)

## 2014-12-06 LAB — TSH: TSH: 1.381 u[IU]/mL (ref 0.350–4.500)

## 2014-12-31 ENCOUNTER — Encounter: Payer: BC Managed Care – PPO | Admitting: Gynecology

## 2015-02-05 ENCOUNTER — Encounter: Payer: Self-pay | Admitting: Gynecology

## 2015-02-05 ENCOUNTER — Ambulatory Visit (INDEPENDENT_AMBULATORY_CARE_PROVIDER_SITE_OTHER): Payer: BLUE CROSS/BLUE SHIELD | Admitting: Gynecology

## 2015-02-05 VITALS — BP 130/74 | Ht 63.0 in | Wt 188.0 lb

## 2015-02-05 DIAGNOSIS — Z01419 Encounter for gynecological examination (general) (routine) without abnormal findings: Secondary | ICD-10-CM | POA: Diagnosis not present

## 2015-02-05 DIAGNOSIS — M8580 Other specified disorders of bone density and structure, unspecified site: Secondary | ICD-10-CM

## 2015-02-05 DIAGNOSIS — N952 Postmenopausal atrophic vaginitis: Secondary | ICD-10-CM

## 2015-02-05 NOTE — Progress Notes (Signed)
Shawna [name] 1951-06-17 [id number]        64 y.o.  G0P0 for annual exam.  Several issues noted below.  Past medical history,surgical history, problem list, medications, allergies, family history and social history were all reviewed and documented as reviewed in the EPIC chart.  ROS:  Performed with pertinent positives and negatives included in the history, assessment and plan.   Additional significant findings :  none   Exam: [name] Counsellor Vitals:   02/05/15 1441  BP: 130/74  Height: 5\' 3"  (1.6 m)  Weight: 188 lb (85.276 kg)   General appearance:  Normal affect, orientation and appearance. Skin: Grossly normal HEENT: Without gross lesions.  No cervical or supraclavicular adenopathy. Thyroid normal.  Lungs:  Clear without wheezing, rales or rhonchi Cardiac: RR, without RMG Abdominal:  Soft, nontender, without masses, guarding, rebound, organomegaly or hernia Breasts:  Examined lying and sitting without masses, retractions, discharge or axillary adenopathy. With well-healed left lumpectomy scar Pelvic:  Ext/BUS/vagina with generalized atrophic changes  Cervix with atrophic changes  Uterus difficult to palpate. Due to abdominal girth but grossly normal size, midline and mobile nontender   Adnexa  Without masses or tenderness    Anus and perineum  Normal   Rectovaginal  Normal sphincter tone without palpated masses or tenderness.    Assessment/Plan:  64 y.o. G0P0 female for annual exam.   1. Postmenopausal/atrophic genital changes. Patient without significant hot flushes, night sweats, vaginal dryness or any vaginal bleeding. Continue to monitor. Report any vaginal bleeding. 2. Osteopenia. DEXA 2010 T score -1.7. Patient still has not followed up for repeat DEXA as recommended and scheduled. Had been on Fosamax for approximately 4 years stopping 2010. Patient very reluctant to consider treatment and therefore did not want to do the follow up DEXA. I reviewed with her that this  is information only and then it would be her decision if treatment would be indicated to proceed with this or not. The risk benefit of side effects from the medication versus ongoing bone loss and fracture risk discussed. Patient reluctant to schedule now but at least at this point tentatively plans to do so and an order was placed. She clearly understands the issues. 3. Pap smear 2015. No Pap smear done today. No history of significant abnormal Pap smears previously. Repeat at 3 year interval her current screening guidelines. 4. Mammography 10/2014. Continue with annual mammography. SBE monthly reviewed. 5. Colonoscopy 2012. Repeat at their recommended interval. 6. Health maintenance. No routine blood work done as patient reports this done at her primary physician's office. Follow up bone density otherwise in one year, sooner as needed.     Anastasio Auerbach MD, 3:11 PM 02/05/2015

## 2015-02-05 NOTE — Patient Instructions (Signed)
Follow up for bone density is scheduled.  You may obtain a copy of any labs that were done today by logging onto MyChart as outlined in the instructions provided with your AVS (after visit summary). The office will not call with normal lab results but certainly if there are any significant abnormalities then we will contact you.   Health Maintenance, Female A healthy lifestyle and preventative care can promote health and wellness.  Maintain regular health, dental, and eye exams.  Eat a healthy diet. Foods like vegetables, fruits, whole grains, low-fat dairy products, and lean protein foods contain the nutrients you need without too many calories. Decrease your intake of foods high in solid fats, added sugars, and salt. Get information about a proper diet from your caregiver, if necessary.  Regular physical exercise is one of the most important things you can do for your health. Most adults should get at least 150 minutes of moderate-intensity exercise (any activity that increases your heart rate and causes you to sweat) each week. In addition, most adults need muscle-strengthening exercises on 2 or more days a week.   Maintain a healthy weight. The body mass index (BMI) is a screening tool to identify possible weight problems. It provides an estimate of body fat based on height and weight. Your caregiver can help determine your BMI, and can help you achieve or maintain a healthy weight. For adults 20 years and older:  A BMI below 18.5 is considered underweight.  A BMI of 18.5 to 24.9 is normal.  A BMI of 25 to 29.9 is considered overweight.  A BMI of 30 and above is considered obese.  Maintain normal blood lipids and cholesterol by exercising and minimizing your intake of saturated fat. Eat a balanced diet with plenty of fruits and vegetables. Blood tests for lipids and cholesterol should begin at age 44 and be repeated every 5 years. If your lipid or cholesterol levels are high, you are over  50, or you are a high risk for heart disease, you may need your cholesterol levels checked more frequently.Ongoing high lipid and cholesterol levels should be treated with medicines if diet and exercise are not effective.  If you smoke, find out from your caregiver how to quit. If you do not use tobacco, do not start.  Lung cancer screening is recommended for adults aged 3 80 years who are at high risk for developing lung cancer because of a history of smoking. Yearly low-dose computed tomography (CT) is recommended for people who have at least a 30-pack-year history of smoking and are a current smoker or have quit within the past 15 years. A pack year of smoking is smoking an average of 1 pack of cigarettes a day for 1 year (for example: 1 pack a day for 30 years or 2 packs a day for 15 years). Yearly screening should continue until the smoker has stopped smoking for at least 15 years. Yearly screening should also be stopped for people who develop a health problem that would prevent them from having lung cancer treatment.  If you are pregnant, do not drink alcohol. If you are breastfeeding, be very cautious about drinking alcohol. If you are not pregnant and choose to drink alcohol, do not exceed 1 drink per day. One drink is considered to be 12 ounces (355 mL) of beer, 5 ounces (148 mL) of wine, or 1.5 ounces (44 mL) of liquor.  Avoid use of street drugs. Do not share needles with anyone. Ask for help  if you need support or instructions about stopping the use of drugs.  High blood pressure causes heart disease and increases the risk of stroke. Blood pressure should be checked at least every 1 to 2 years. Ongoing high blood pressure should be treated with medicines, if weight loss and exercise are not effective.  If you are 55 to 64 years old, ask your caregiver if you should take aspirin to prevent strokes.  Diabetes screening involves taking a blood sample to check your fasting blood sugar level.  This should be done once every 3 years, after age 45, if you are within normal weight and without risk factors for diabetes. Testing should be considered at a younger age or be carried out more frequently if you are overweight and have at least 1 risk factor for diabetes.  Breast cancer screening is essential preventative care for women. You should practice "breast self-awareness." This means understanding the normal appearance and feel of your breasts and may include breast self-examination. Any changes detected, no matter how small, should be reported to a caregiver. Women in their 20s and 30s should have a clinical breast exam (CBE) by a caregiver as part of a regular health exam every 1 to 3 years. After age 40, women should have a CBE every year. Starting at age 40, women should consider having a mammogram (breast X-ray) every year. Women who have a family history of breast cancer should talk to their caregiver about genetic screening. Women at a high risk of breast cancer should talk to their caregiver about having an MRI and a mammogram every year.  Breast cancer gene (BRCA)-related cancer risk assessment is recommended for women who have family members with BRCA-related cancers. BRCA-related cancers include breast, ovarian, tubal, and peritoneal cancers. Having family members with these cancers may be associated with an increased risk for harmful changes (mutations) in the breast cancer genes BRCA1 and BRCA2. Results of the assessment will determine the need for genetic counseling and BRCA1 and BRCA2 testing.  The Pap test is a screening test for cervical cancer. Women should have a Pap test starting at age 21. Between ages 21 and 29, Pap tests should be repeated every 2 years. Beginning at age 30, you should have a Pap test every 3 years as long as the past 3 Pap tests have been normal. If you had a hysterectomy for a problem that was not cancer or a condition that could lead to cancer, then you no  longer need Pap tests. If you are between ages 65 and 70, and you have had normal Pap tests going back 10 years, you no longer need Pap tests. If you have had past treatment for cervical cancer or a condition that could lead to cancer, you need Pap tests and screening for cancer for at least 20 years after your treatment. If Pap tests have been discontinued, risk factors (such as a new sexual partner) need to be reassessed to determine if screening should be resumed. Some women have medical problems that increase the chance of getting cervical cancer. In these cases, your caregiver may recommend more frequent screening and Pap tests.  The human papillomavirus (HPV) test is an additional test that may be used for cervical cancer screening. The HPV test looks for the virus that can cause the cell changes on the cervix. The cells collected during the Pap test can be tested for HPV. The HPV test could be used to screen women aged 30 years and older, and   should be used in women of any age who have unclear Pap test results. After the age of 30, women should have HPV testing at the same frequency as a Pap test.  Colorectal cancer can be detected and often prevented. Most routine colorectal cancer screening begins at the age of 50 and continues through age 75. However, your caregiver may recommend screening at an earlier age if you have risk factors for colon cancer. On a yearly basis, your caregiver may provide home test kits to check for hidden blood in the stool. Use of a small camera at the end of a tube, to directly examine the colon (sigmoidoscopy or colonoscopy), can detect the earliest forms of colorectal cancer. Talk to your caregiver about this at age 50, when routine screening begins. Direct examination of the colon should be repeated every 5 to 10 years through age 75, unless early forms of pre-cancerous polyps or small growths are found.  Hepatitis C blood testing is recommended for all people born from  1945 through 1965 and any individual with known risks for hepatitis C.  Practice safe sex. Use condoms and avoid high-risk sexual practices to reduce the spread of sexually transmitted infections (STIs). Sexually active women aged 25 and younger should be checked for Chlamydia, which is a common sexually transmitted infection. Older women with new or multiple partners should also be tested for Chlamydia. Testing for other STIs is recommended if you are sexually active and at increased risk.  Osteoporosis is a disease in which the bones lose minerals and strength with aging. This can result in serious bone fractures. The risk of osteoporosis can be identified using a bone density scan. Women ages 65 and over and women at risk for fractures or osteoporosis should discuss screening with their caregivers. Ask your caregiver whether you should be taking a calcium supplement or vitamin D to reduce the rate of osteoporosis.  Menopause can be associated with physical symptoms and risks. Hormone replacement therapy is available to decrease symptoms and risks. You should talk to your caregiver about whether hormone replacement therapy is right for you.  Use sunscreen. Apply sunscreen liberally and repeatedly throughout the day. You should seek shade when your shadow is shorter than you. Protect yourself by wearing long sleeves, pants, a wide-brimmed hat, and sunglasses year round, whenever you are outdoors.  Notify your caregiver of new moles or changes in moles, especially if there is a change in shape or color. Also notify your caregiver if a mole is larger than the size of a pencil eraser.  Stay current with your immunizations. Document Released: 06/08/2011 Document Revised: 03/20/2013 Document Reviewed: 06/08/2011 [employer] Patient Information 2014 [employer].   

## 2015-02-06 LAB — URINALYSIS W MICROSCOPIC + REFLEX CULTURE
Bacteria, UA: NONE SEEN
Bilirubin Urine: NEGATIVE
Casts: NONE SEEN
Crystals: NONE SEEN
Glucose, UA: NEGATIVE mg/dL
HGB URINE DIPSTICK: NEGATIVE
KETONES UR: NEGATIVE mg/dL
NITRITE: NEGATIVE
Protein, ur: NEGATIVE mg/dL
SPECIFIC GRAVITY, URINE: 1.012 (ref 1.005–1.030)
Squamous Epithelial / LPF: NONE SEEN
UROBILINOGEN UA: 0.2 mg/dL (ref 0.0–1.0)
pH: 6 (ref 5.0–8.0)

## 2015-02-07 ENCOUNTER — Other Ambulatory Visit: Payer: Self-pay | Admitting: Gynecology

## 2015-02-07 MED ORDER — SULFAMETHOXAZOLE-TRIMETHOPRIM 800-160 MG PO TABS: 1.0000 | ORAL_TABLET | Freq: Two times a day (BID) | ORAL | Status: DC

## 2015-02-08 LAB — URINE CULTURE: Colony Count: >=100000

## 2015-03-11 ENCOUNTER — Ambulatory Visit (INDEPENDENT_AMBULATORY_CARE_PROVIDER_SITE_OTHER): Payer: BLUE CROSS/BLUE SHIELD | Admitting: Internal Medicine

## 2015-03-11 ENCOUNTER — Encounter: Payer: Self-pay | Admitting: Internal Medicine

## 2015-03-11 VITALS — BP 142/86 | HR 80 | Temp 97.5°F | Resp 16 | Ht 63.0 in | Wt 189.0 lb

## 2015-03-11 DIAGNOSIS — R7303 Prediabetes: Secondary | ICD-10-CM

## 2015-03-11 DIAGNOSIS — R7309 Other abnormal glucose: Secondary | ICD-10-CM

## 2015-03-11 DIAGNOSIS — I10 Essential (primary) hypertension: Secondary | ICD-10-CM

## 2015-03-11 DIAGNOSIS — E782 Mixed hyperlipidemia: Secondary | ICD-10-CM

## 2015-03-11 DIAGNOSIS — Z111 Encounter for screening for respiratory tuberculosis: Secondary | ICD-10-CM

## 2015-03-11 DIAGNOSIS — Z1212 Encounter for screening for malignant neoplasm of rectum: Secondary | ICD-10-CM

## 2015-03-11 DIAGNOSIS — R5383 Other fatigue: Secondary | ICD-10-CM

## 2015-03-11 DIAGNOSIS — E559 Vitamin D deficiency, unspecified: Secondary | ICD-10-CM

## 2015-03-11 DIAGNOSIS — Z79899 Other long term (current) drug therapy: Secondary | ICD-10-CM

## 2015-03-11 LAB — BASIC METABOLIC PANEL WITH GFR
BUN: 14 mg/dL (ref 6–23)
CHLORIDE: 108 meq/L (ref 96–112)
CO2: 24 mEq/L (ref 19–32)
CREATININE: 0.69 mg/dL (ref 0.50–1.10)
Calcium: 9.2 mg/dL (ref 8.4–10.5)
GFR, Est African American: >89 mL/min
Glucose, Bld: 107 mg/dL — ABNORMAL HIGH (ref 70–99)
Potassium: 4.2 mEq/L (ref 3.5–5.3)
SODIUM: 142 meq/L (ref 135–145)

## 2015-03-11 LAB — CBC WITH DIFFERENTIAL/PLATELET
BASOS PCT: 0 % (ref 0–1)
Basophils Absolute: 0 10*3/uL (ref 0.0–0.1)
Eosinophils Absolute: 0.1 10*3/uL (ref 0.0–0.7)
Eosinophils Relative: 2 % (ref 0–5)
HCT: 40.7 % (ref 36.0–46.0)
Hemoglobin: 13.9 g/dL (ref 12.0–15.0)
LYMPHS PCT: 33 % (ref 12–46)
Lymphs Abs: 2 10*3/uL (ref 0.7–4.0)
MCH: 30.4 pg (ref 26.0–34.0)
MCHC: 34.2 g/dL (ref 30.0–36.0)
MCV: 89.1 fL (ref 78.0–100.0)
MONO ABS: 0.4 10*3/uL (ref 0.1–1.0)
MPV: 9.6 fL (ref 8.6–12.4)
Monocytes Relative: 7 % (ref 3–12)
NEUTROS ABS: 3.5 10*3/uL (ref 1.7–7.7)
Neutrophils Relative %: 58 % (ref 43–77)
PLATELETS: 194 10*3/uL (ref 150–400)
RBC: 4.57 MIL/uL (ref 3.87–5.11)
RDW: 14.6 % (ref 11.5–15.5)
WBC: 6 10*3/uL (ref 4.0–10.5)

## 2015-03-11 LAB — HEPATIC FUNCTION PANEL
ALT: 17 U/L (ref 0–35)
AST: 20 U/L (ref 0–37)
Albumin: 4.2 g/dL (ref 3.5–5.2)
Alkaline Phosphatase: 78 U/L (ref 39–117)
BILIRUBIN TOTAL: 0.4 mg/dL (ref 0.2–1.2)
Total Protein: 6.4 g/dL (ref 6.0–8.3)

## 2015-03-11 LAB — IRON AND TIBC
%SAT: 19 % — ABNORMAL LOW (ref 20–55)
Iron: 60 ug/dL (ref 42–145)
TIBC: 322 ug/dL (ref 250–470)
UIBC: 262 ug/dL (ref 125–400)

## 2015-03-11 LAB — LIPID PANEL
Cholesterol: 184 mg/dL (ref 0–200)
HDL: 50 mg/dL (ref >=46)
LDL Cholesterol: 114 mg/dL — ABNORMAL HIGH (ref 0–99)
Total CHOL/HDL Ratio: 3.7 Ratio
Triglycerides: 100 mg/dL (ref <150)
VLDL: 20 mg/dL (ref 0–40)

## 2015-03-11 LAB — VITAMIN B12: VITAMIN B 12: 621 pg/mL (ref 211–911)

## 2015-03-11 LAB — HEMOGLOBIN A1C
Hgb A1c MFr Bld: 6.1 % — ABNORMAL HIGH (ref <5.7)
Mean Plasma Glucose: 128 mg/dL — ABNORMAL HIGH (ref <117)

## 2015-03-11 LAB — TSH: TSH: 1.727 u[IU]/mL (ref 0.350–4.500)

## 2015-03-11 LAB — MAGNESIUM: Magnesium: 1.8 mg/dL (ref 1.5–2.5)

## 2015-03-11 NOTE — Progress Notes (Signed)
Patient ID: Shawna [name], female   DOB: 1951/02/17, 64 y.o.   MRN: [id number]  Annual Comprehensive Examination  This very nice 64 y.o. MWF presents for complete physical.  Patient has been followed for HTN, Prediabetes, Hyperlipidemia, and Vitamin D Deficiency.    HTN predates since Oct 2010.  Patient's BP has been controlled at home and patient denies any cardiac symptoms as chest pain, palpitations, shortness of breath, dizziness or ankle swelling. Today's BP was sl elevated at 142/86.     Patient's hyperlipidemia is controlled with diet and medications. Patient denies myalgias or other medication SE's. Last lipids were near goal - Total Chol 178; HDL 49; with elevated LDL 782; Trig 107 on 12/05/2014.   Patient has Morbid Obesity (BMI 32.67) and consequent prediabetes predating since May 2010 with A1c 5.8%  and patient denies reactive hypoglycemic symptoms, visual blurring, diabetic polys, or paresthesias. Last A1c was  6.1% on 12/05/2014.   Finally, patient has history of Vitamin D Deficiency of 36 in 2008 and last Vitamin D was 53 on 12/05/2014.  Medication Sig  . atorvastatin (LIPITOR) 80 MG tablet TAKE ONE TABLET BY MOUTH EVERY DAY FOR CHOLESTEROL  . Cholecalciferol (VITAMIN D PO) Take 2,000 Units by mouth. Takes 8000 iu daily  . Magnesium 250 MG TABS Take by mouth daily.  . Multiple Vitamin (MULTIVITAMIN) tablet Take 1 tablet by mouth daily.     Allergies  Allergen Reactions  . Nitrofurantoin Monohyd Macro Nausea Only   Past Medical History  Diagnosis Date  . Hypercholesteremia   . 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 Serous cyst of ovary     left  . Osteopenia 03/2009    T score -1.7  . Cancer     Breast   Health Maintenance  Topic Date Due  . COLONOSCOPY  06/19/2001  . ZOSTAVAX  06/20/2011  . PAP SMEAR  12/27/2014  . INFLUENZA VACCINE  07/08/2015  . MAMMOGRAM  10/29/2016  . TETANUS/TDAP  09/14/2019  . HIV Screening  Completed   Immunization  History  Administered Date(s) Administered  . DTaP 06/26/1999  . Influenza Split 09/05/2014  . Influenza Whole 09/01/2013  . PPD Test 03/05/2014, 03/11/2015  . Pneumococcal Polysaccharide-23 02/04/2006  . Tdap 09/13/2009   Past Surgical History  Procedure Laterality Date  . Breast lumpectomy  2006  . Salpingoophorectomy  1978    left   Family History  Problem Relation Age of Onset  . Hypertension Mother   . Colon cancer Father   . Diabetes Sister   . Heart disease Sister   . Cancer Sister     ? Type  . Breast cancer Paternal Grandmother     Age 20's   History  Substance Use Topics  . Smoking status: Never Smoker   . Smokeless tobacco: Not on file  . Alcohol Use: No    ROS Constitutional: Denies fever, chills, weight loss/gain, headaches, insomnia,  night sweats, and change in appetite. Does c/o fatigue. Eyes: Denies redness, blurred vision, diplopia, discharge, itchy, watery eyes.  ENT: Denies discharge, congestion, post nasal drip, epistaxis, sore throat, earache, hearing loss, dental pain, Tinnitus, Vertigo, Sinus pain, snoring.  Cardio: Denies chest pain, palpitations, irregular heartbeat, syncope, dyspnea, diaphoresis, orthopnea, PND, claudication, edema Respiratory: denies cough, dyspnea, DOE, pleurisy, hoarseness, laryngitis, wheezing.  Gastrointestinal: Denies dysphagia, heartburn, reflux, water brash, pain, cramps, nausea, vomiting, bloating, diarrhea, constipation, hematemesis, melena, hematochezia, jaundice, hemorrhoids Genitourinary: Denies dysuria, frequency, urgency, nocturia, hesitancy, discharge,  hematuria, flank pain Breast: Breast lumps, nipple discharge, bleeding.  Musculoskeletal: Denies arthralgia, myalgia, stiffness, Jt. Swelling, pain, limp, and strain/sprain. Denies falls. Skin: Denies puritis, rash, hives, warts, acne, eczema, changing in skin lesion Neuro: No weakness, tremor, incoordination, spasms, paresthesia, pain Psychiatric: Denies confusion,  memory loss, sensory loss. Denies Depression. Endocrine: Denies change in weight, skin, hair change, nocturia, and paresthesia, diabetic polys, visual blurring, hyper / hypo glycemic episodes.  Heme/Lymph: No excessive bleeding, bruising, enlarged lymph nodes.  Physical Exam  BP 142/86   Pulse 80  Temp 97.5 F   Resp 16  Ht 5\' 3"    Wt 189 lb    BMI 33.49   General Appearance: Well nourished and in no apparent distress. Eyes: PERRLA, EOMs, conjunctiva no swelling or erythema, normal fundi and vessels. Sinuses: No frontal/maxillary tenderness ENT/Mouth: EACs patent / TMs  nl. Nares clear without erythema, swelling, mucoid exudates. Oral hygiene is good. No erythema, swelling, or exudate. Tongue normal, non-obstructing. Tonsils not swollen or erythematous. Hearing normal.  Neck: Supple, thyroid normal. No bruits, nodes or JVD. Respiratory: Respiratory effort normal.  BS equal and clear bilateral without rales, rhonci, wheezing or stridor. Cardio: Heart sounds are normal with regular rate and rhythm and no murmurs, rubs or gallops. Peripheral pulses are normal and equal bilaterally without edema. No aortic or femoral bruits. Chest: symmetric with normal excursions and percussion. Breasts: Symmetric, without lumps, nipple discharge, retractions, or fibrocystic changes.  Abdomen: Flat, soft, with bowl sounds. Nontender, no guarding, rebound, hernias, masses, or organomegaly.  Lymphatics: Non tender without lymphadenopathy.  Genitourinary: Def Dr Audie Box Musculoskeletal: Full ROM all peripheral extremities, joint stability, 5/5 strength, and normal gait. Skin: Warm and dry without rashes, lesions, cyanosis, clubbing or  ecchymosis.  Neuro: Cranial nerves intact, reflexes equal bilaterally. Normal muscle tone, no cerebellar symptoms. Sensation intact.  Pysch: Awake and oriented X 3, normal affect, Insight and Judgment appropriate.   Assessment and Plan  1. Essential hypertension  -  Microalbumin / creatinine urine ratio - EKG 12-Lead - [place], RETROPERITNL ABD,  LTD  2. Hyperlipidemia  - Lipid panel  3. Prediabetes  - Hemoglobin A1c - Insulin, random  4. Vitamin D deficiency  - Vit D  25 hydroxy (rtn osteoporosis monitoring)  5. Morbid obesity (BMI 32.67)   6. Other fatigue  - Vitamin B12 - Iron and TIBC - TSH  7. Screening for rectal cancer  - POC Hemoccult Bld/Stl (3-Cd Home Screen); Future  8. Screening examination for pulmonary tuberculosis  - PPD  9. Medication management  - Urine Microscopic - CBC with Differential/Platelet - BASIC METABOLIC PANEL WITH GFR - Hepatic function panel - Magnesium   Continue prudent diet as discussed, weight control, BP monitoring, regular exercise, and medications. Discussed med's effects and SE's. Screening labs and tests as requested with regular follow-up as recommended. Over 40 minutes of exam, counseling, chart review was performed.

## 2015-03-11 NOTE — Patient Instructions (Addendum)
Recommend the book "The END of DIETING" by Dr Excell Seltzer   & the book "The END of DIABETES " by Dr Excell Seltzer  At [email or web address] - get book & Audio CD's      Being diabetic has a  300% increased risk for heart attack, stroke, cancer, and alzheimer- type vascular dementia. It is very important that you work harder with diet by avoiding all foods that are white. Avoid white rice (brown & wild rice is OK), white potatoes (sweetpotatoes in moderation is OK), White bread or wheat bread or anything made out of white flour like bagels, donuts, rolls, buns, biscuits, cakes, pastries, cookies, pizza crust, and pasta (made from white flour & egg whites) - vegetarian pasta or spinach or wheat pasta is OK. Multigrain breads like Arnold's or [employer], or multigrain sandwich thins or flatbreads.  Diet, exercise and weight loss can reverse and cure diabetes in the early stages.  Diet, exercise and weight loss is very important in the control and prevention of complications of diabetes which affects every system in your body, ie. Brain - dementia/stroke, eyes - glaucoma/blindness, heart - heart attack/heart failure, kidneys - dialysis, stomach - gastric paralysis, intestines - malabsorption, nerves - severe painful neuritis, circulation - gangrene & loss of a leg(s), and finally cancer and Alzheimers.    I recommend avoid fried & greasy foods,  sweets/candy, white rice (brown or wild rice or Quinoa is OK), white potatoes (sweet potatoes are OK) - anything made from white flour - bagels, doughnuts, rolls, buns, biscuits,white and wheat breads, pizza crust and traditional pasta made of white flour & egg white(vegetarian pasta or spinach or wheat pasta is OK).  Multi-grain bread is OK - like multi-grain flat bread or sandwich thins. Avoid alcohol in excess. Exercise is also important.    Eat all the vegetables you want - avoid meat, especially red meat and dairy - especially cheese.  Cheese is the most  concentrated form of trans-fats which is the worst thing to clog up our arteries. Veggie cheese is OK which can be found in the fresh produce section at [employer] or [employer] or [employer]  Preventive Care for Adults A healthy lifestyle and preventive care can promote health and wellness. Preventive health guidelines for women include the following key practices.  A routine yearly physical is a good way to check with your health care provider about your health and preventive screening. It is a chance to share any concerns and updates on your health and to receive a thorough exam.  Visit your dentist for a routine exam and preventive care every 6 months. Brush your teeth twice a day and floss once a day. Good oral hygiene prevents tooth decay and gum disease.  The frequency of eye exams is based on your age, health, family medical history, use of contact lenses, and other factors. Follow your health care provider's recommendations for frequency of eye exams.  Eat a healthy diet. Foods like vegetables, fruits, whole grains, low-fat dairy products, and lean protein foods contain the nutrients you need without too many calories. Decrease your intake of foods high in solid fats, added sugars, and salt. Eat the right amount of calories for you.Get information about a proper diet from your health care provider, if necessary.  Regular physical exercise is one of the most important things you can do for your health. Most adults should get at least 150 minutes of moderate-intensity exercise (any activity that increases your heart rate and  causes you to sweat) each week. In addition, most adults need muscle-strengthening exercises on 2 or more days a week.  Maintain a healthy weight. The body mass index (BMI) is a screening tool to identify possible weight problems. It provides an estimate of body fat based on height and weight. Your health care provider can find your BMI and can help you achieve or  maintain a healthy weight.For adults 20 years and older:  A BMI below 18.5 is considered underweight.  A BMI of 18.5 to 24.9 is normal.  A BMI of 25 to 29.9 is considered overweight.  A BMI of 30 and above is considered obese.  Maintain normal blood lipids and cholesterol levels by exercising and minimizing your intake of saturated fat. Eat a balanced diet with plenty of fruit and vegetables. Blood tests for lipids and cholesterol should begin at age 82 and be repeated every 5 years. If your lipid or cholesterol levels are high, you are over 50, or you are at high risk for heart disease, you may need your cholesterol levels checked more frequently.Ongoing high lipid and cholesterol levels should be treated with medicines if diet and exercise are not working.  If you smoke, find out from your health care provider how to quit. If you do not use tobacco, do not start.  Lung cancer screening is recommended for adults aged 35-80 years who are at high risk for developing lung cancer because of a history of smoking. A yearly low-dose CT scan of the lungs is recommended for people who have at least a 30-pack-year history of smoking and are a current smoker or have quit within the past 15 years. A pack year of smoking is smoking an average of 1 pack of cigarettes a day for 1 year (for example: 1 pack a day for 30 years or 2 packs a day for 15 years). Yearly screening should continue until the smoker has stopped smoking for at least 15 years. Yearly screening should be stopped for people who develop a health problem that would prevent them from having lung cancer treatment.  If you are pregnant, do not drink alcohol. If you are breastfeeding, be very cautious about drinking alcohol. If you are not pregnant and choose to drink alcohol, do not have more than 1 drink per day. One drink is considered to be 12 ounces (355 mL) of beer, 5 ounces (148 mL) of wine, or 1.5 ounces (44 mL) of liquor.  Avoid use of  street drugs. Do not share needles with anyone. Ask for help if you need support or instructions about stopping the use of drugs.  High blood pressure causes heart disease and increases the risk of stroke. Your blood pressure should be checked at least every 1 to 2 years. Ongoing high blood pressure should be treated with medicines if weight loss and exercise do not work.  If you are 101-59 years old, ask your health care provider if you should take aspirin to prevent strokes.  Diabetes screening involves taking a blood sample to check your fasting blood sugar level. This should be done once every 3 years, after age 71, if you are within normal weight and without risk factors for diabetes. Testing should be considered at a younger age or be carried out more frequently if you are overweight and have at least 1 risk factor for diabetes.  Breast cancer screening is essential preventive care for women. You should practice "breast self-awareness." This means understanding the normal appearance and feel  of your breasts and may include breast self-examination. Any changes detected, no matter how small, should be reported to a health care provider. Women in their 63s and 30s should have a clinical breast exam (CBE) by a health care provider as part of a regular health exam every 1 to 3 years. After age 22, women should have a CBE every year. Starting at age 3, women should consider having a mammogram (breast X-ray test) every year. Women who have a family history of breast cancer should talk to their health care provider about genetic screening. Women at a high risk of breast cancer should talk to their health care providers about having an MRI and a mammogram every year.  Breast cancer gene (BRCA)-related cancer risk assessment is recommended for women who have family members with BRCA-related cancers. BRCA-related cancers include breast, ovarian, tubal, and peritoneal cancers. Having family members with these  cancers may be associated with an increased risk for harmful changes (mutations) in the breast cancer genes BRCA1 and BRCA2. Results of the assessment will determine the need for genetic counseling and BRCA1 and BRCA2 testing.  Routine pelvic exams to screen for cancer are no longer recommended for nonpregnant women who are considered low risk for cancer of the pelvic organs (ovaries, uterus, and vagina) and who do not have symptoms. Ask your health care provider if a screening pelvic exam is right for you.  If you have had past treatment for cervical cancer or a condition that could lead to cancer, you need Pap tests and screening for cancer for at least 20 years after your treatment. If Pap tests have been discontinued, your risk factors (such as having a new sexual partner) need to be reassessed to determine if screening should be resumed. Some women have medical problems that increase the chance of getting cervical cancer. In these cases, your health care provider may recommend more frequent screening and Pap tests.  The HPV test is an additional test that may be used for cervical cancer screening. The HPV test looks for the virus that can cause the cell changes on the cervix. The cells collected during the Pap test can be tested for HPV. The HPV test could be used to screen women aged 50 years and older, and should be used in women of any age who have unclear Pap test results. After the age of 85, women should have HPV testing at the same frequency as a Pap test.  Colorectal cancer can be detected and often prevented. Most routine colorectal cancer screening begins at the age of 63 years and continues through age 12 years. However, your health care provider may recommend screening at an earlier age if you have risk factors for colon cancer. On a yearly basis, your health care provider may provide home test kits to check for hidden blood in the stool. Use of a small camera at the end of a tube, to  directly examine the colon (sigmoidoscopy or colonoscopy), can detect the earliest forms of colorectal cancer. Talk to your health care provider about this at age 31, when routine screening begins. Direct exam of the colon should be repeated every 5-10 years through age 14 years, unless early forms of pre-cancerous polyps or small growths are found.  People who are at an increased risk for hepatitis B should be screened for this virus. You are considered at high risk for hepatitis B if:  You were born in a country where hepatitis B occurs often. Talk with your  health care provider about which countries are considered high risk.  Your parents were born in a high-risk country and you have not received a shot to protect against hepatitis B (hepatitis B vaccine).  You have HIV or AIDS.  You use needles to inject street drugs.  You live with, or have sex with, someone who has hepatitis B.  You get hemodialysis treatment.  You take certain medicines for conditions like cancer, organ transplantation, and autoimmune conditions.  Hepatitis C blood testing is recommended for all people born from [birth year] through 1965 and any individual with known risks for hepatitis C.  Practice safe sex. Use condoms and avoid high-risk sexual practices to reduce the spread of sexually transmitted infections (STIs). STIs include gonorrhea, chlamydia, syphilis, trichomonas, herpes, HPV, and human immunodeficiency virus (HIV). Herpes, HIV, and HPV are viral illnesses that have no cure. They can result in disability, cancer, and death.  You should be screened for sexually transmitted illnesses (STIs) including gonorrhea and chlamydia if:  You are sexually active and are younger than 24 years.  You are older than 24 years and your health care provider tells you that you are at risk for this type of infection.  Your sexual activity has changed since you were last screened and you are at an increased risk for chlamydia or  gonorrhea. Ask your health care provider if you are at risk.  If you are at risk of being infected with HIV, it is recommended that you take a prescription medicine daily to prevent HIV infection. This is called preexposure prophylaxis (PrEP). You are considered at risk if:  You are a heterosexual woman, are sexually active, and are at increased risk for HIV infection.  You take drugs by injection.  You are sexually active with a partner who has HIV.  Talk with your health care provider about whether you are at high risk of being infected with HIV. If you choose to begin PrEP, you should first be tested for HIV. You should then be tested every 3 months for as long as you are taking PrEP.  Osteoporosis is a disease in which the bones lose minerals and strength with aging. This can result in serious bone fractures or breaks. The risk of osteoporosis can be identified using a bone density scan. Women ages 90 years and over and women at risk for fractures or osteoporosis should discuss screening with their health care providers. Ask your health care provider whether you should take a calcium supplement or vitamin D to reduce the rate of osteoporosis.  Menopause can be associated with physical symptoms and risks. Hormone replacement therapy is available to decrease symptoms and risks. You should talk to your health care provider about whether hormone replacement therapy is right for you.  Use sunscreen. Apply sunscreen liberally and repeatedly throughout the day. You should seek shade when your shadow is shorter than you. Protect yourself by wearing long sleeves, pants, a wide-brimmed hat, and sunglasses year round, whenever you are outdoors.  Once a month, do a whole body skin exam, using a mirror to look at the skin on your back. Tell your health care provider of new moles, moles that have irregular borders, moles that are larger than a pencil eraser, or moles that have changed in shape or  color.  Stay current with required vaccines (immunizations).  Influenza vaccine. All adults should be immunized every year.  Tetanus, diphtheria, and acellular pertussis (Td, Tdap) vaccine. Pregnant women should receive 1 dose of Tdap  vaccine during each pregnancy. The dose should be obtained regardless of the length of time since the last dose. Immunization is preferred during the 27th-36th week of gestation. An adult who has not previously received Tdap or who does not know her vaccine status should receive 1 dose of Tdap. This initial dose should be followed by tetanus and diphtheria toxoids (Td) booster doses every 10 years. Adults with an unknown or incomplete history of completing a 3-dose immunization series with Td-containing vaccines should begin or complete a primary immunization series including a Tdap dose. Adults should receive a Td booster every 10 years.  Varicella vaccine. An adult without evidence of immunity to varicella should receive 2 doses or a second dose if she has previously received 1 dose. Pregnant females who do not have evidence of immunity should receive the first dose after pregnancy. This first dose should be obtained before leaving the health care facility. The second dose should be obtained 4-8 weeks after the first dose.  Human papillomavirus (HPV) vaccine. Females aged 13-26 years who have not received the vaccine previously should obtain the 3-dose series. The vaccine is not recommended for use in pregnant females. However, pregnancy testing is not needed before receiving a dose. If a female is found to be pregnant after receiving a dose, no treatment is needed. In that case, the remaining doses should be delayed until after the pregnancy. Immunization is recommended for any person with an immunocompromised condition through the age of 79 years if she did not get any or all doses earlier. During the 3-dose series, the second dose should be obtained 4-8 weeks after the  first dose. The third dose should be obtained 24 weeks after the first dose and 16 weeks after the second dose.  Zoster vaccine. One dose is recommended for adults aged 51 years or older unless certain conditions are present.  Measles, mumps, and rubella (MMR) vaccine. Adults born before [birth year] generally are considered immune to measles and mumps. Adults born in [birth year] or later should have 1 or more doses of MMR vaccine unless there is a contraindication to the vaccine or there is laboratory evidence of immunity to each of the three diseases. A routine second dose of MMR vaccine should be obtained at least 28 days after the first dose for students attending postsecondary schools, health care workers, or international travelers. People who received inactivated measles vaccine or an unknown type of measles vaccine during 1963-1967 should receive 2 doses of MMR vaccine. People who received inactivated mumps vaccine or an unknown type of mumps vaccine before 1979 and are at high risk for mumps infection should consider immunization with 2 doses of MMR vaccine. For females of childbearing age, rubella immunity should be determined. If there is no evidence of immunity, females who are not pregnant should be vaccinated. If there is no evidence of immunity, females who are pregnant should delay immunization until after pregnancy. Unvaccinated health care workers born before [birth year] who lack laboratory evidence of measles, mumps, or rubella immunity or laboratory confirmation of disease should consider measles and mumps immunization with 2 doses of MMR vaccine or rubella immunization with 1 dose of MMR vaccine.  Pneumococcal 13-valent conjugate (PCV13) vaccine. When indicated, a person who is uncertain of her immunization history and has no record of immunization should receive the PCV13 vaccine. An adult aged 52 years or older who has certain medical conditions and has not been previously immunized should receive 1 dose of  PCV13 vaccine. This PCV13  should be followed with a dose of pneumococcal polysaccharide (PPSV23) vaccine. The PPSV23 vaccine dose should be obtained at least 8 weeks after the dose of PCV13 vaccine. An adult aged 41 years or older who has certain medical conditions and previously received 1 or more doses of PPSV23 vaccine should receive 1 dose of PCV13. The PCV13 vaccine dose should be obtained 1 or more years after the last PPSV23 vaccine dose.  Pneumococcal polysaccharide (PPSV23) vaccine. When PCV13 is also indicated, PCV13 should be obtained first. All adults aged 59 years and older should be immunized. An adult younger than age 47 years who has certain medical conditions should be immunized. Any person who resides in a nursing home or long-term care facility should be immunized. An adult smoker should be immunized. People with an immunocompromised condition and certain other conditions should receive both PCV13 and PPSV23 vaccines. People with human immunodeficiency virus (HIV) infection should be immunized as soon as possible after diagnosis. Immunization during chemotherapy or radiation therapy should be avoided. Routine use of PPSV23 vaccine is not recommended for American Indians, [place] Natives, or people younger than 65 years unless there are medical conditions that require PPSV23 vaccine. When indicated, people who have unknown immunization and have no record of immunization should receive PPSV23 vaccine. One-time revaccination 5 years after the first dose of PPSV23 is recommended for people aged 19-64 years who have chronic kidney failure, nephrotic syndrome, asplenia, or immunocompromised conditions. People who received 1-2 doses of PPSV23 before age 44 years should receive another dose of PPSV23 vaccine at age 11 years or later if at least 5 years have passed since the previous dose. Doses of PPSV23 are not needed for people immunized with PPSV23 at or after age 62 years.  Meningococcal vaccine.  Adults with asplenia or persistent complement component deficiencies should receive 2 doses of quadrivalent meningococcal conjugate (MenACWY-D) vaccine. The doses should be obtained at least 2 months apart. Microbiologists working with certain meningococcal bacteria, [place] recruits, people at risk during an outbreak, and people who travel to or live in countries with a high rate of meningitis should be immunized. A first-year college student up through age 38 years who is living in a residence hall should receive a dose if she did not receive a dose on or after her 16th birthday. Adults who have certain high-risk conditions should receive one or more doses of vaccine.  Hepatitis A vaccine. Adults who wish to be protected from this disease, have certain high-risk conditions, work with hepatitis A-infected animals, work in hepatitis A research labs, or travel to or work in countries with a high rate of hepatitis A should be immunized. Adults who were previously unvaccinated and who anticipate close contact with an international adoptee during the first 60 days after arrival in the [place] from a country with a high rate of hepatitis A should be immunized.  Hepatitis B vaccine. Adults who wish to be protected from this disease, have certain high-risk conditions, may be exposed to blood or other infectious body fluids, are household contacts or sex partners of hepatitis B positive people, are clients or workers in certain care facilities, or travel to or work in countries with a high rate of hepatitis B should be immunized.  Haemophilus influenzae type b (Hib) vaccine. A previously unvaccinated person with asplenia or sickle cell disease or having a scheduled splenectomy should receive 1 dose of Hib vaccine. Regardless of previous immunization, a recipient of a hematopoietic stem cell transplant should receive  a 3-dose series 6-12 months after her successful transplant. Hib vaccine is not recommended for  adults with HIV infection. Preventive Services / Frequency Ages 53 to 60 years  Blood pressure check.** / Every 1 to 2 years.  Lipid and cholesterol check.** / Every 5 years beginning at age 32.  Clinical breast exam.** / Every 3 years for women in their 75s and 86s.  BRCA-related cancer risk assessment.** / For women who have family members with a BRCA-related cancer (breast, ovarian, tubal, or peritoneal cancers).  Pap test.** / Every 2 years from ages 54 through 44. Every 3 years starting at age 90 through age 9 or 69 with a history of 3 consecutive normal Pap tests.  HPV screening.** / Every 3 years from ages 76 through ages 68 to 92 with a history of 3 consecutive normal Pap tests.  Hepatitis C blood test.** / For any individual with known risks for hepatitis C.  Skin self-exam. / Monthly.  Influenza vaccine. / Every year.  Tetanus, diphtheria, and acellular pertussis (Tdap, Td) vaccine.** / Consult your health care provider. Pregnant women should receive 1 dose of Tdap vaccine during each pregnancy. 1 dose of Td every 10 years.  Varicella vaccine.** / Consult your health care provider. Pregnant females who do not have evidence of immunity should receive the first dose after pregnancy.  HPV vaccine. / 3 doses over 6 months, if 30 and younger. The vaccine is not recommended for use in pregnant females. However, pregnancy testing is not needed before receiving a dose.  Measles, mumps, rubella (MMR) vaccine.** / You need at least 1 dose of MMR if you were born in 1957 or later. You may also need a 2nd dose. For females of childbearing age, rubella immunity should be determined. If there is no evidence of immunity, females who are not pregnant should be vaccinated. If there is no evidence of immunity, females who are pregnant should delay immunization until after pregnancy.  Pneumococcal 13-valent conjugate (PCV13) vaccine.** / Consult your health care provider.  Pneumococcal  polysaccharide (PPSV23) vaccine.** / 1 to 2 doses if you smoke cigarettes or if you have certain conditions.  Meningococcal vaccine.** / 1 dose if you are age 70 to 70 years and a [employer] researcher living in a residence hall, or have one of several medical conditions, you need to get vaccinated against meningococcal disease. You may also need additional booster doses.  Hepatitis A vaccine.** / Consult your health care provider.  Hepatitis B vaccine.** / Consult your health care provider.  Haemophilus influenzae type b (Hib) vaccine.** / Consult your health care provider. Ages 29 to 56 years  Blood pressure check.** / Every 1 to 2 years.  Lipid and cholesterol check.** / Every 5 years beginning at age 77 years.  Lung cancer screening. / Every year if you are aged 40-80 years and have a 30-pack-year history of smoking and currently smoke or have quit within the past 15 years. Yearly screening is stopped once you have quit smoking for at least 15 years or develop a health problem that would prevent you from having lung cancer treatment.  Clinical breast exam.** / Every year after age 76 years.  BRCA-related cancer risk assessment.** / For women who have family members with a BRCA-related cancer (breast, ovarian, tubal, or peritoneal cancers).  Mammogram.** / Every year beginning at age 75 years and continuing for as long as you are in good health. Consult with your health care provider.  Pap test.** /  Every 3 years starting at age 37 years through age 88 or 85 years with a history of 3 consecutive normal Pap tests.  HPV screening.** / Every 3 years from ages 58 years through ages 94 to 31 years with a history of 3 consecutive normal Pap tests.  Fecal occult blood test (FOBT) of stool. / Every year beginning at age 37 years and continuing until age 79 years. You may not need to do this test if you get a colonoscopy every 10 years.  Flexible sigmoidoscopy or colonoscopy.** / Every 5  years for a flexible sigmoidoscopy or every 10 years for a colonoscopy beginning at age 110 years and continuing until age 80 years.  Hepatitis C blood test.** / For all people born from [birth year] through 1965 and any individual with known risks for hepatitis C.  Skin self-exam. / Monthly.  Influenza vaccine. / Every year.  Tetanus, diphtheria, and acellular pertussis (Tdap/Td) vaccine.** / Consult your health care provider. Pregnant women should receive 1 dose of Tdap vaccine during each pregnancy. 1 dose of Td every 10 years.  Varicella vaccine.** / Consult your health care provider. Pregnant females who do not have evidence of immunity should receive the first dose after pregnancy.  Zoster vaccine.** / 1 dose for adults aged 38 years or older.  Measles, mumps, rubella (MMR) vaccine.** / You need at least 1 dose of MMR if you were born in 1957 or later. You may also need a 2nd dose. For females of childbearing age, rubella immunity should be determined. If there is no evidence of immunity, females who are not pregnant should be vaccinated. If there is no evidence of immunity, females who are pregnant should delay immunization until after pregnancy.  Pneumococcal 13-valent conjugate (PCV13) vaccine.** / Consult your health care provider.  Pneumococcal polysaccharide (PPSV23) vaccine.** / 1 to 2 doses if you smoke cigarettes or if you have certain conditions.  Meningococcal vaccine.** / Consult your health care provider.  Hepatitis A vaccine.** / Consult your health care provider.  Hepatitis B vaccine.** / Consult your health care provider.  Haemophilus influenzae type b (Hib) vaccine.** / Consult your health care provider. Ages 39 years and over  Blood pressure check.** / Every 1 to 2 years.  Lipid and cholesterol check.** / Every 5 years beginning at age 61 years.  Lung cancer screening. / Every year if you are aged 63-80 years and have a 30-pack-year history of smoking and currently  smoke or have quit within the past 15 years. Yearly screening is stopped once you have quit smoking for at least 15 years or develop a health problem that would prevent you from having lung cancer treatment.  Clinical breast exam.** / Every year after age 53 years.  BRCA-related cancer risk assessment.** / For women who have family members with a BRCA-related cancer (breast, ovarian, tubal, or peritoneal cancers).  Mammogram.** / Every year beginning at age 76 years and continuing for as long as you are in good health. Consult with your health care provider.  Pap test.** / Every 3 years starting at age 32 years through age 18 or 66 years with 3 consecutive normal Pap tests. Testing can be stopped between 65 and 70 years with 3 consecutive normal Pap tests and no abnormal Pap or HPV tests in the past 10 years.  HPV screening.** / Every 3 years from ages 80 years through ages 63 or 30 years with a history of 3 consecutive normal Pap tests. Testing can be stopped  between 65 and 70 years with 3 consecutive normal Pap tests and no abnormal Pap or HPV tests in the past 10 years.  Fecal occult blood test (FOBT) of stool. / Every year beginning at age 33 years and continuing until age 92 years. You may not need to do this test if you get a colonoscopy every 10 years.  Flexible sigmoidoscopy or colonoscopy.** / Every 5 years for a flexible sigmoidoscopy or every 10 years for a colonoscopy beginning at age 33 years and continuing until age 57 years.  Hepatitis C blood test.** / For all people born from [birth year] through 1965 and any individual with known risks for hepatitis C.  Osteoporosis screening.** / A one-time screening for women ages 49 years and over and women at risk for fractures or osteoporosis.  Skin self-exam. / Monthly.  Influenza vaccine. / Every year.  Tetanus, diphtheria, and acellular pertussis (Tdap/Td) vaccine.** / 1 dose of Td every 10 years.  Varicella vaccine.** / Consult your  health care provider.  Zoster vaccine.** / 1 dose for adults aged 79 years or older.  Pneumococcal 13-valent conjugate (PCV13) vaccine.** / Consult your health care provider.  Pneumococcal polysaccharide (PPSV23) vaccine.** / 1 dose for all adults aged 43 years and older.  Meningococcal vaccine.** / Consult your health care provider.  Hepatitis A vaccine.** / Consult your health care provider.  Hepatitis B vaccine.** / Consult your health care provider.  Haemophilus influenzae type b (Hib) vaccine.** / Consult your health care provider.   Health Maintenance Adopting a healthy lifestyle and getting preventive care can go a long way to promote health and wellness. Talk with your health care provider about what schedule of regular examinations is right for you. This is a good chance for you to check in with your provider about disease prevention and staying healthy. In between checkups, there are plenty of things you can do on your own. Experts have done a lot of research about which lifestyle changes and preventive measures are most likely to keep you healthy. Ask your health care provider for more information. WEIGHT AND DIET  Eat a healthy diet Be sure to include plenty of vegetables, fruits, low-fat dairy products, and lean protein. Do not eat a lot of foods high in solid fats, added sugars, or salt. Get regular exercise. This is one of the most important things you can do for your health. Most adults should exercise for at least 150 minutes each week. The exercise should increase your heart rate and make you sweat (moderate-intensity exercise). Most adults should also do strengthening exercises at least twice a week. This is in addition to the moderate-intensity exercise.  Maintain a healthy weight Body mass index (BMI) is a measurement that can be used to identify possible weight problems. It estimates body fat based on height and weight. Your health care provider can help determine  your BMI and help you achieve or maintain a healthy weight. For females 59 years of age and older:  A BMI below 18.5 is considered underweight. A BMI of 18.5 to 24.9 is normal. A BMI of 25 to 29.9 is considered overweight. A BMI of 30 and above is considered obese.  Watch levels of cholesterol and blood lipids You should start having your blood tested for lipids and cholesterol at 64 years of age, then have this test every 5 years. You may need to have your cholesterol levels checked more often if: Your lipid or cholesterol levels are high. You are older than  64 years of age. You are at high risk for heart disease.  CANCER SCREENING   Lung Cancer Lung cancer screening is recommended for adults 23-63 years old who are at high risk for lung cancer because of a history of smoking. A yearly low-dose CT scan of the lungs is recommended for people who: Currently smoke. Have quit within the past 15 years. Have at least a 30-pack-year history of smoking. A pack year is smoking an average of one pack of cigarettes a day for 1 year. Yearly screening should continue until it has been 15 years since you quit. Yearly screening should stop if you develop a health problem that would prevent you from having lung cancer treatment.  Breast Cancer Practice breast self-awareness. This means understanding how your breasts normally appear and feel. It also means doing regular breast self-exams. Let your health care provider know about any changes, no matter how small. If you are in your 20s or 30s, you should have a clinical breast exam (CBE) by a health care provider every 1-3 years as part of a regular health exam. If you are 61 or older, have a CBE every year. Also consider having a breast X-ray (mammogram) every year. If you have a family history of breast cancer, talk to your health care provider about genetic screening. If you are at high risk for breast cancer, talk to your health care provider  about having an MRI and a mammogram every year. Breast cancer gene (BRCA) assessment is recommended for women who have family members with BRCA-related cancers. BRCA-related cancers include: Breast. Ovarian. Tubal. Peritoneal cancers. Results of the assessment will determine the need for genetic counseling and BRCA1 and BRCA2 testing. Cervical Cancer Routine pelvic examinations to screen for cervical cancer are no longer recommended for nonpregnant women who are considered low risk for cancer of the pelvic organs (ovaries, uterus, and vagina) and who do not have symptoms. A pelvic examination may be necessary if you have symptoms including those associated with pelvic infections. Ask your health care provider if a screening pelvic exam is right for you.  The Pap test is the screening test for cervical cancer for women who are considered at risk. If you had a hysterectomy for a problem that was not cancer or a condition that could lead to cancer, then you no longer need Pap tests. If you are older than 65 years, and you have had normal Pap tests for the past 10 years, you no longer need to have Pap tests. If you have had past treatment for cervical cancer or a condition that could lead to cancer, you need Pap tests and screening for cancer for at least 20 years after your treatment. If you no longer get a Pap test, assess your risk factors if they change (such as having a new sexual partner). This can affect whether you should start being screened again. Some women have medical problems that increase their chance of getting cervical cancer. If this is the case for you, your health care provider may recommend more frequent screening and Pap tests. The human papillomavirus (HPV) test is another test that may be used for cervical cancer screening. The HPV test looks for the virus that can cause cell changes in the cervix. The cells collected during the Pap test can be tested for HPV. The HPV test can be  used to screen women 83 years of age and older. Getting tested for HPV can extend the interval between normal Pap tests  from three to five years. An HPV test also should be used to screen women of any age who have unclear Pap test results. After 64 years of age, women should have HPV testing as often as Pap tests.  Colorectal Cancer This type of cancer can be detected and often prevented. Routine colorectal cancer screening usually begins at 64 years of age and continues through 64 years of age. Your health care provider may recommend screening at an earlier age if you have risk factors for colon cancer. Your health care provider may also recommend using home test kits to check for hidden blood in the stool. A small camera at the end of a tube can be used to examine your colon directly (sigmoidoscopy or colonoscopy). This is done to check for the earliest forms of colorectal cancer. Routine screening usually begins at age 25. Direct examination of the colon should be repeated every 5-10 years through 64 years of age. However, you may need to be screened more often if early forms of precancerous polyps or small growths are found. Skin Cancer Check your skin from head to toe regularly. Tell your health care provider about any new moles or changes in moles, especially if there is a change in a mole's shape or color. Also tell your health care provider if you have a mole that is larger than the size of a pencil eraser. Always use sunscreen. Apply sunscreen liberally and repeatedly throughout the day. Protect yourself by wearing long sleeves, pants, a wide-brimmed hat, and sunglasses whenever you are outside. HEART DISEASE, DIABETES, AND HIGH BLOOD PRESSURE  Have your blood pressure checked at least every 1-2 years. High blood pressure causes heart disease and increases the risk of stroke. If you are between 81 years and 30 years old, ask your health care provider if you should take aspirin to prevent  strokes. Have regular diabetes screenings. This involves taking a blood sample to check your fasting blood sugar level. If you are at a normal weight and have a low risk for diabetes, have this test once every three years after 64 years of age. If you are overweight and have a high risk for diabetes, consider being tested at a younger age or more often. PREVENTING INFECTION  Hepatitis B If you have a higher risk for hepatitis B, you should be screened for this virus. You are considered at high risk for hepatitis B if: You were born in a country where hepatitis B is common. Ask your health care provider which countries are considered high risk. Your parents were born in a high-risk country, and you have not been immunized against hepatitis B (hepatitis B vaccine). You have HIV or AIDS. You use needles to inject street drugs. You live with someone who has hepatitis B. You have had sex with someone who has hepatitis B. You get hemodialysis treatment. You take certain medicines for conditions, including cancer, organ transplantation, and autoimmune conditions. Hepatitis C Blood testing is recommended for: Everyone born from [birth year] through 1965. Anyone with known risk factors for hepatitis C. Sexually transmitted infections (STIs) You should be screened for sexually transmitted infections (STIs) including gonorrhea and chlamydia if: You are sexually active and are younger than 64 years of age. You are older than 64 years of age and your health care provider tells you that you are at risk for this type of infection. Your sexual activity has changed since you were last screened and you are at an increased risk for chlamydia or  gonorrhea. Ask your health care provider if you are at risk. If you do not have HIV, but are at risk, it may be recommended that you take a prescription medicine daily to prevent HIV infection. This is called pre-exposure prophylaxis (PrEP). You are considered at risk if: You  are sexually active and do not regularly use condoms or know the HIV status of your partner(s). You take drugs by injection. You are sexually active with a partner who has HIV. Talk with your health care provider about whether you are at high risk of being infected with HIV. If you choose to begin PrEP, you should first be tested for HIV. You should then be tested every 3 months for as long as you are taking PrEP.  PREGNANCY  If you are premenopausal and you may become pregnant, ask your health care provider about preconception counseling. If you may become pregnant, take 400 to 800 micrograms (mcg) of folic acid every day. If you want to prevent pregnancy, talk to your health care provider about birth control (contraception). OSTEOPOROSIS AND MENOPAUSE  Osteoporosis is a disease in which the bones lose minerals and strength with aging. This can result in serious bone fractures. Your risk for osteoporosis can be identified using a bone density scan. If you are 63 years of age or older, or if you are at risk for osteoporosis and fractures, ask your health care provider if you should be screened. Ask your health care provider whether you should take a calcium or vitamin D supplement to lower your risk for osteoporosis. Menopause may have certain physical symptoms and risks. Hormone replacement therapy may reduce some of these symptoms and risks. Talk to your health care provider about whether hormone replacement therapy is right for you.  HOME CARE INSTRUCTIONS  Schedule regular health, dental, and eye exams. Stay current with your immunizations.  Do not use any tobacco products including cigarettes, chewing tobacco, or electronic cigarettes. If you are pregnant, do not drink alcohol. If you are breastfeeding, limit how much and how often you drink alcohol. Limit alcohol intake to no more than 1 drink per day for nonpregnant women. One drink equals 12 ounces of beer, 5 ounces of wine, or 1  ounces of hard liquor. Do not use street drugs. Do not share needles. Ask your health care provider for help if you need support or information about quitting drugs. Tell your health care provider if you often feel depressed. Tell your health care provider if you have ever been abused or do not feel safe at home

## 2015-03-12 LAB — URINALYSIS, MICROSCOPIC ONLY
Bacteria, UA: NONE SEEN
CRYSTALS: NONE SEEN
Casts: NONE SEEN
SQUAMOUS EPITHELIAL / LPF: NONE SEEN

## 2015-03-12 LAB — INSULIN, RANDOM: Insulin: 14.1 u[IU]/mL (ref 2.0–19.6)

## 2015-03-12 LAB — MICROALBUMIN / CREATININE URINE RATIO
Creatinine, Urine: 106 mg/dL
Microalb Creat Ratio: 6.6 mg/g (ref 0.0–30.0)
Microalb, Ur: 0.7 mg/dL (ref <2.0)

## 2015-03-12 LAB — VITAMIN D 25 HYDROXY (VIT D DEFICIENCY, FRACTURES): VIT D 25 HYDROXY: 48 ng/mL (ref 30–100)

## 2015-03-14 LAB — TB SKIN TEST
INDURATION: 0 mm
TB Skin Test: NEGATIVE

## 2015-06-26 ENCOUNTER — Ambulatory Visit: Payer: Self-pay | Admitting: Internal Medicine

## 2015-07-22 ENCOUNTER — Other Ambulatory Visit: Payer: Self-pay | Admitting: Internal Medicine

## 2015-08-21 ENCOUNTER — Other Ambulatory Visit: Payer: Self-pay

## 2015-08-21 DIAGNOSIS — Z1231 Encounter for screening mammogram for malignant neoplasm of breast: Secondary | ICD-10-CM

## 2015-10-04 ENCOUNTER — Encounter: Payer: Self-pay | Admitting: Internal Medicine

## 2015-10-04 ENCOUNTER — Ambulatory Visit (INDEPENDENT_AMBULATORY_CARE_PROVIDER_SITE_OTHER): Payer: BLUE CROSS/BLUE SHIELD | Admitting: Internal Medicine

## 2015-10-04 VITALS — BP 122/84 | HR 76 | Temp 96.8°F | Resp 16 | Ht 63.0 in | Wt 184.8 lb

## 2015-10-04 DIAGNOSIS — E559 Vitamin D deficiency, unspecified: Secondary | ICD-10-CM | POA: Diagnosis not present

## 2015-10-04 DIAGNOSIS — I10 Essential (primary) hypertension: Secondary | ICD-10-CM

## 2015-10-04 DIAGNOSIS — Z6832 Body mass index (BMI) 32.0-32.9, adult: Secondary | ICD-10-CM

## 2015-10-04 DIAGNOSIS — Z23 Encounter for immunization: Secondary | ICD-10-CM

## 2015-10-04 DIAGNOSIS — E782 Mixed hyperlipidemia: Secondary | ICD-10-CM

## 2015-10-04 DIAGNOSIS — R7303 Prediabetes: Secondary | ICD-10-CM

## 2015-10-04 DIAGNOSIS — Z79899 Other long term (current) drug therapy: Secondary | ICD-10-CM | POA: Diagnosis not present

## 2015-10-04 LAB — HEPATIC FUNCTION PANEL
ALK PHOS: 101 U/L (ref 33–130)
ALT: 18 U/L (ref 6–29)
AST: 20 U/L (ref 10–35)
Albumin: 4.2 g/dL (ref 3.6–5.1)
BILIRUBIN INDIRECT: 0.3 mg/dL (ref 0.2–1.2)
Bilirubin, Direct: 0.1 mg/dL (ref <=0.2)
TOTAL PROTEIN: 6.6 g/dL (ref 6.1–8.1)
Total Bilirubin: 0.4 mg/dL (ref 0.2–1.2)

## 2015-10-04 LAB — LIPID PANEL
Cholesterol: 169 mg/dL (ref 125–200)
HDL: 53 mg/dL (ref >=46)
LDL Cholesterol: 93 mg/dL (ref <130)
TRIGLYCERIDES: 116 mg/dL (ref <150)
Total CHOL/HDL Ratio: 3.2 Ratio (ref <=5.0)
VLDL: 23 mg/dL (ref <30)

## 2015-10-04 LAB — BASIC METABOLIC PANEL WITH GFR
BUN: 20 mg/dL (ref 7–25)
CO2: 24 mmol/L (ref 20–31)
Calcium: 9.2 mg/dL (ref 8.6–10.4)
Chloride: 105 mmol/L (ref 98–110)
Creat: 0.82 mg/dL (ref 0.50–0.99)
GFR, EST AFRICAN AMERICAN: 87 mL/min (ref >=60)
GFR, Est Non African American: 76 mL/min (ref >=60)
GLUCOSE: 107 mg/dL — AB (ref 65–99)
POTASSIUM: 4.8 mmol/L (ref 3.5–5.3)
Sodium: 140 mmol/L (ref 135–146)

## 2015-10-04 LAB — HEMOGLOBIN A1C
Hgb A1c MFr Bld: 6.2 % — ABNORMAL HIGH (ref <5.7)
MEAN PLASMA GLUCOSE: 131 mg/dL — AB (ref <117)

## 2015-10-04 LAB — CBC WITH DIFFERENTIAL/PLATELET
Basophils Absolute: 0 10*3/uL (ref 0.0–0.1)
Basophils Relative: 0 % (ref 0–1)
EOS ABS: 0.2 10*3/uL (ref 0.0–0.7)
EOS PCT: 3 % (ref 0–5)
HCT: 40.8 % (ref 36.0–46.0)
Hemoglobin: 14.2 g/dL (ref 12.0–15.0)
LYMPHS ABS: 1.9 10*3/uL (ref 0.7–4.0)
Lymphocytes Relative: 30 % (ref 12–46)
MCH: 30.9 pg (ref 26.0–34.0)
MCHC: 34.8 g/dL (ref 30.0–36.0)
MCV: 88.7 fL (ref 78.0–100.0)
MONOS PCT: 9 % (ref 3–12)
MPV: 9.8 fL (ref 8.6–12.4)
Monocytes Absolute: 0.6 10*3/uL (ref 0.1–1.0)
Neutro Abs: 3.7 10*3/uL (ref 1.7–7.7)
Neutrophils Relative %: 58 % (ref 43–77)
Platelets: 197 10*3/uL (ref 150–400)
RBC: 4.6 MIL/uL (ref 3.87–5.11)
RDW: 13.5 % (ref 11.5–15.5)
WBC: 6.4 10*3/uL (ref 4.0–10.5)

## 2015-10-04 LAB — MAGNESIUM: Magnesium: 2.1 mg/dL (ref 1.5–2.5)

## 2015-10-04 LAB — TSH: TSH: 1.276 u[IU]/mL (ref 0.350–4.500)

## 2015-10-04 NOTE — Patient Instructions (Signed)

## 2015-10-05 ENCOUNTER — Encounter: Payer: Self-pay | Admitting: Internal Medicine

## 2015-10-05 ENCOUNTER — Other Ambulatory Visit: Payer: Self-pay | Admitting: Internal Medicine

## 2015-10-05 DIAGNOSIS — E119 Type 2 diabetes mellitus without complications: Secondary | ICD-10-CM

## 2015-10-05 LAB — VITAMIN D 25 HYDROXY (VIT D DEFICIENCY, FRACTURES): Vit D, 25-Hydroxy: 54 ng/mL (ref 30–100)

## 2015-10-05 LAB — INSULIN, RANDOM: INSULIN: 22.6 u[IU]/mL — AB (ref 2.0–19.6)

## 2015-10-05 MED ORDER — METFORMIN HCL ER 500 MG PO TB24
ORAL_TABLET | ORAL | Status: DC
Start: 2015-10-05 — End: 2016-02-06

## 2015-10-05 NOTE — Progress Notes (Signed)
Patient ID: Shawna [name], female   DOB: 03-15-1951, 64 y.o.   MRN: [id number]   This very nice 64 y.o. MWF presents for 3 month follow up with Hypertension, Hyperlipidemia, Pre-Diabetes and Vitamin D Deficiency.    Patient is treated for HTN since 2010 & BP has been controlled at home. Today's BP: 122/84 mmHg. Patient has had no complaints of any cardiac type chest pain, palpitations, dyspnea/orthopnea/PND, dizziness, claudication, or dependent edema.   Hyperlipidemia is controlled with diet & meds. Patient denies myalgias or other med SE's. Current Lipids are at goal with Cholesterol 169; HDL 53; LDL 93; and Triglycerides 086.   Also, the patient has history of Morbid Obesity (BMI 32+) and consequent PreDiabetes since Dec 2011 with A1c 6.1% and she has had no symptoms of reactive hypoglycemia, diabetic polys, paresthesias or visual blurring. Current A1c is still  not at goal at 6.2%.    Further, the patient also has history of Vitamin D Deficiency of 36 in 2008 and supplements vitamin D without any suspected side-effects. Current vitamin D is 54.   Medication Sig  . atorvastatin (LIPITOR) 80 MG tablet TAKE ONE TABLET BY MOUTH ONCE DAILY FOR CHOLESTEROL  . Cholecalciferol (VITAMIN D PO) Take 2,000 Units by mouth. Takes 8000 iu daily  . Magnesium 250 MG TABS Take by mouth daily.  . Multiple Vitamin (MULTIVITAMIN) tablet Take 1 tablet by mouth daily.     Allergies  Allergen Reactions  . Nitrofurantoin Monohyd Macro Nausea Only   PMHx:   Past Medical History  Diagnosis Date  . Hypercholesteremia   . 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 Serous cyst of ovary     left  . Osteopenia 03/2009    T score -1.7  . Cancer (HCC)     Breast   Immunization History  Administered Date(s) Administered  . DTaP 06/26/1999  . Influenza Split 09/05/2014, 10/04/2015  . Influenza Whole 09/01/2013  . PPD Test 03/05/2014, 03/11/2015  . Pneumococcal Polysaccharide-23 02/04/2006  . Tdap  09/13/2009  . Zoster 10/04/2015   Past Surgical History  Procedure Laterality Date  . Breast lumpectomy  2006  . Salpingoophorectomy  1978    left   FHx:    Reviewed / unchanged  SHx:    Reviewed / unchanged  Systems Review:  Constitutional: Denies fever, chills, wt changes, headaches, insomnia, fatigue, night sweats, change in appetite. Eyes: Denies redness, blurred vision, diplopia, discharge, itchy, watery eyes.  ENT: Denies discharge, congestion, post nasal drip, epistaxis, sore throat, earache, hearing loss, dental pain, tinnitus, vertigo, sinus pain, snoring.  CV: Denies chest pain, palpitations, irregular heartbeat, syncope, dyspnea, diaphoresis, orthopnea, PND, claudication or edema. Respiratory: denies cough, dyspnea, DOE, pleurisy, hoarseness, laryngitis, wheezing.  Gastrointestinal: Denies dysphagia, odynophagia, heartburn, reflux, water brash, abdominal pain or cramps, nausea, vomiting, bloating, diarrhea, constipation, hematemesis, melena, hematochezia  or hemorrhoids. Genitourinary: Denies dysuria, frequency, urgency, nocturia, hesitancy, discharge, hematuria or flank pain. Musculoskeletal: Denies arthralgias, myalgias, stiffness, jt. swelling, pain, limping or strain/sprain.  Skin: Denies pruritus, rash, hives, warts, acne, eczema or change in skin lesion(s). Neuro: No weakness, tremor, incoordination, spasms, paresthesia or pain. Psychiatric: Denies confusion, memory loss or sensory loss. Endo: Denies change in weight, skin or hair change.  Heme/Lymph: No excessive bleeding, bruising or enlarged lymph nodes.  Physical Exam  BP 122/84 mmHg  Pulse 76  Temp(Src) 96.8 F (36 C)  Resp 16  Ht 5\' 3"  (1.6 m)  Wt 184 lb 12.8 oz (  83.825 kg)  BMI 32.74 kg/m2  Appears well nourished and in no distress. Eyes: PERRLA, EOMs, conjunctiva no swelling or erythema. Sinuses: No frontal/maxillary tenderness ENT/Mouth: EAC's clear, TM's nl w/o erythema, bulging. Nares clear w/o  erythema, swelling, exudates. Oropharynx clear without erythema or exudates. Oral hygiene is good. Tongue normal, non obstructing. Hearing intact.  Neck: Supple. Thyroid nl. Car 2+/2+ without bruits, nodes or JVD. Chest: Respirations nl with BS clear & equal w/o rales, rhonchi, wheezing or stridor.  Cor: Heart sounds normal w/ regular rate and rhythm without sig. murmurs, gallops, clicks, or rubs. Peripheral pulses normal and equal  without edema.  Abdomen: Soft & bowel sounds normal. Non-tender w/o guarding, rebound, hernias, masses, or organomegaly.  Lymphatics: Unremarkable.  Musculoskeletal: Full ROM all peripheral extremities, joint stability, 5/5 strength, and normal gait.  Skin: Warm, dry without exposed rashes, lesions or ecchymosis apparent.  Neuro: Cranial nerves intact, reflexes equal bilaterally. Sensory-motor testing grossly intact. Tendon reflexes grossly intact.  Pysch: Alert & oriented x 3.  Insight and judgement nl & appropriate. No ideations.  Assessment and Plan:  1. Essential hypertension  - TSH  2. Hyperlipidemia  - Lipid panel  3. Prediabetes  - Hemoglobin A1c - Insulin, random  4. Vitamin D deficiency  - Vit D  25 hydroxy   5. Medication management  - CBC with Differential/Platelet - BASIC METABOLIC PANEL WITH GFR - Hepatic function panel - Magnesium  6. Need for prophylactic vaccination and inoculation against influenza  - Flu vaccine > 3yo with preservative IM (Fluvirin Influenza Split)  7. Need for prophylactic vaccination and inoculation against varicella  - Varicella-zoster vaccine subcutaneous   Recommended regular exercise, BP monitoring, weight control, and discussed med and SE's. Recommended labs to assess and monitor clinical status. Further disposition pending results of labs. Over 30 minutes of exam, counseling, chart review was performed

## 2015-11-04 ENCOUNTER — Ambulatory Visit
Admission: RE | Admit: 2015-11-04 | Discharge: 2015-11-04 | Disposition: A | Discharge status: Home / Self Care | Payer: BLUE CROSS/BLUE SHIELD | Source: Ambulatory Visit

## 2015-11-04 DIAGNOSIS — Z1231 Encounter for screening mammogram for malignant neoplasm of breast: Secondary | ICD-10-CM

## 2015-11-07 DIAGNOSIS — M8580 Other specified disorders of bone density and structure, unspecified site: Secondary | ICD-10-CM

## 2015-11-07 HISTORY — DX: Other specified disorders of bone density and structure, unspecified site: M85.80

## 2015-11-14 ENCOUNTER — Other Ambulatory Visit: Payer: Self-pay | Admitting: *Deleted

## 2015-11-14 DIAGNOSIS — M8580 Other specified disorders of bone density and structure, unspecified site: Secondary | ICD-10-CM

## 2015-11-28 ENCOUNTER — Encounter: Payer: Self-pay | Admitting: Gynecology

## 2015-11-28 ENCOUNTER — Ambulatory Visit (INDEPENDENT_AMBULATORY_CARE_PROVIDER_SITE_OTHER): Payer: BLUE CROSS/BLUE SHIELD

## 2015-11-28 ENCOUNTER — Other Ambulatory Visit: Payer: Self-pay | Admitting: Gynecology

## 2015-11-28 DIAGNOSIS — M899 Disorder of bone, unspecified: Secondary | ICD-10-CM

## 2015-11-28 DIAGNOSIS — M8589 Other specified disorders of bone density and structure, multiple sites: Secondary | ICD-10-CM

## 2015-11-28 DIAGNOSIS — M8580 Other specified disorders of bone density and structure, unspecified site: Secondary | ICD-10-CM

## 2016-01-06 ENCOUNTER — Encounter: Payer: Self-pay | Admitting: Physician Assistant

## 2016-01-06 ENCOUNTER — Ambulatory Visit (INDEPENDENT_AMBULATORY_CARE_PROVIDER_SITE_OTHER): Payer: BLUE CROSS/BLUE SHIELD | Admitting: Physician Assistant

## 2016-01-06 VITALS — BP 140/80 | HR 85 | Temp 98.4°F | Resp 16 | Ht 63.0 in | Wt 183.6 lb

## 2016-01-06 DIAGNOSIS — I10 Essential (primary) hypertension: Secondary | ICD-10-CM

## 2016-01-06 DIAGNOSIS — E559 Vitamin D deficiency, unspecified: Secondary | ICD-10-CM

## 2016-01-06 DIAGNOSIS — Z79899 Other long term (current) drug therapy: Secondary | ICD-10-CM

## 2016-01-06 DIAGNOSIS — R7303 Prediabetes: Secondary | ICD-10-CM | POA: Diagnosis not present

## 2016-01-06 DIAGNOSIS — E782 Mixed hyperlipidemia: Secondary | ICD-10-CM

## 2016-01-06 LAB — CBC WITH DIFFERENTIAL/PLATELET
BASOS PCT: 0 % (ref 0–1)
Basophils Absolute: 0 10*3/uL (ref 0.0–0.1)
Eosinophils Absolute: 0.1 10*3/uL (ref 0.0–0.7)
Eosinophils Relative: 1 % (ref 0–5)
HCT: 41.1 % (ref 36.0–46.0)
HEMOGLOBIN: 13.8 g/dL (ref 12.0–15.0)
Lymphocytes Relative: 31 % (ref 12–46)
Lymphs Abs: 2.3 10*3/uL (ref 0.7–4.0)
MCH: 30.1 pg (ref 26.0–34.0)
MCHC: 33.6 g/dL (ref 30.0–36.0)
MCV: 89.5 fL (ref 78.0–100.0)
MPV: 9.9 fL (ref 8.6–12.4)
Monocytes Absolute: 0.4 10*3/uL (ref 0.1–1.0)
Monocytes Relative: 5 % (ref 3–12)
NEUTROS ABS: 4.7 10*3/uL (ref 1.7–7.7)
NEUTROS PCT: 63 % (ref 43–77)
Platelets: 199 10*3/uL (ref 150–400)
RBC: 4.59 MIL/uL (ref 3.87–5.11)
RDW: 14.1 % (ref 11.5–15.5)
WBC: 7.4 10*3/uL (ref 4.0–10.5)

## 2016-01-06 LAB — HEPATIC FUNCTION PANEL
ALT: 15 U/L (ref 6–29)
AST: 19 U/L (ref 10–35)
Albumin: 4.2 g/dL (ref 3.6–5.1)
Alkaline Phosphatase: 88 U/L (ref 33–130)
BILIRUBIN DIRECT: 0.1 mg/dL (ref <=0.2)
BILIRUBIN INDIRECT: 0.4 mg/dL (ref 0.2–1.2)
TOTAL PROTEIN: 6.5 g/dL (ref 6.1–8.1)
Total Bilirubin: 0.5 mg/dL (ref 0.2–1.2)

## 2016-01-06 LAB — BASIC METABOLIC PANEL WITH GFR
BUN: 18 mg/dL (ref 7–25)
CHLORIDE: 103 mmol/L (ref 98–110)
CO2: 28 mmol/L (ref 20–31)
Calcium: 9.7 mg/dL (ref 8.6–10.4)
Creat: 0.8 mg/dL (ref 0.50–0.99)
GFR, EST NON AFRICAN AMERICAN: 78 mL/min (ref >=60)
GFR, Est African American: >89 mL/min (ref >=60)
GLUCOSE: 92 mg/dL (ref 65–99)
Potassium: 4.7 mmol/L (ref 3.5–5.3)
SODIUM: 138 mmol/L (ref 135–146)

## 2016-01-06 LAB — LIPID PANEL
CHOL/HDL RATIO: 3.2 ratio (ref <=5.0)
Cholesterol: 168 mg/dL (ref 125–200)
HDL: 52 mg/dL (ref >=46)
LDL CALC: 91 mg/dL (ref <130)
Triglycerides: 123 mg/dL (ref <150)
VLDL: 25 mg/dL (ref <30)

## 2016-01-06 LAB — TSH: TSH: 1.529 u[IU]/mL (ref 0.350–4.500)

## 2016-01-06 LAB — HEMOGLOBIN A1C
HEMOGLOBIN A1C: 5.9 % — AB (ref <5.7)
MEAN PLASMA GLUCOSE: 123 mg/dL — AB (ref <117)

## 2016-01-06 LAB — MAGNESIUM: Magnesium: 2 mg/dL (ref 1.5–2.5)

## 2016-01-06 NOTE — Progress Notes (Signed)
Assessment and Plan:  Hypertension:  monitor blood pressure at home. Continue DASH diet.  Reminder to go to the ER if any CP, SOB, nausea, dizziness, severe HA, changes vision/speech, left arm numbness and tingling, and jaw pain. Cholesterol: Continue diet and exercise. Check cholesterol.  Pre-diabetes-Continue diet and exercise. Check A1C Obesity with co morbidities- long discussion about weight loss, diet, and exercise Vitamin D Def- check level and continue medications.   Continue diet and meds as discussed. Further disposition pending results of labs. Future Appointments Date Time Provider [place]  02/06/2016 11:00 AM Anastasio Auerbach, MD [facility]um[facility]  03/30/2016 9:00 AM Unk Pinto, MD [facility] None     HPI 65 y.o. female  presents for 3 month follow up with hypertension, hyperlipidemia, prediabetes and vitamin D. Her blood pressure has been controlled at home, today their BP is BP: 140/80 mmHg She does workout. She denies chest pain, shortness of breath, dizziness.  She is on cholesterol medication, lipitor 80mg  1/2 pill 4 days a week and denies myalgias. Her cholesterol is at goal. The cholesterol last visit was:   Lab Results  Component Value Date   CHOL 169 10/04/2015   HDL 53 10/04/2015   LDLCALC 93 10/04/2015   TRIG 116 10/04/2015   CHOLHDL 3.2 10/04/2015   She has been working on diet and exercise for prediabetes, and denies paresthesia of the feet, polydipsia, polyuria and visual disturbances. On bASA. Last A1C in the office was:  Lab Results  Component Value Date   HGBA1C 6.2* 10/04/2015  Patient is on Vitamin D supplement, 8000 IU daily.   Lab Results  Component Value Date   VD25OH 54 10/04/2015  BMI is Body mass index is 32.53 kg/(m^2)., she is working on diet and exercise. Wt Readings from Last 3 Encounters:  01/06/16 183 lb 9.6 oz (83.28 kg)  10/04/15 184 lb 12.8 oz (83.825 kg)  03/11/15 189 lb (85.73 kg)   Current Medications:  Current  Outpatient Prescriptions on File Prior to Visit  Medication Sig Dispense Refill  . atorvastatin (LIPITOR) 80 MG tablet TAKE ONE TABLET BY MOUTH ONCE DAILY FOR CHOLESTEROL 30 tablet 2  . Calcium Carb-Cholecalciferol (CALTRATE 600+D3 SOFT) 600-800 MG-UNIT CHEW Chew 1 Units by mouth daily.    . Cholecalciferol (VITAMIN D PO) Take 2,000 Units by mouth. Takes 8000 iu daily    . Magnesium 250 MG TABS Take by mouth daily.    . Multiple Vitamin (MULTIVITAMIN) tablet Take 1 tablet by mouth daily.      . metFORMIN (GLUCOPHAGE XR) 500 MG 24 hr tablet Take 2 tablets 2 x day for blood sugar (Patient not taking: Reported on 01/06/2016) 360 tablet 1   No current facility-administered medications on file prior to visit.   Medical History:  Past Medical History  Diagnosis Date  . Hypercholesteremia   . 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 Serous cyst of ovary     left  . Osteopenia 11/2015    T score -1.6 stable from prior DEXA  . Cancer [facility])     Breast   Allergies:  Allergies  Allergen Reactions  . Nitrofurantoin Monohyd Macro Nausea Only    Review of Systems:  Review of Systems  Constitutional: Negative.   HENT: Negative.   Eyes: Negative.   Respiratory: Negative.   Cardiovascular: Negative.   Gastrointestinal: Negative.   Genitourinary: Negative.   Musculoskeletal: Negative.   Skin: Negative.   Neurological: Negative.  Endo/Heme/Allergies: Negative.   Psychiatric/Behavioral: Negative.     Family history- Review and unchanged Social history- Review and unchanged Physical Exam: BP 140/80 mmHg  Pulse 85  Temp(Src) 98.4 F (36.9 C) (Temporal)  Resp 16  Ht 5\' 3"  (1.6 m)  Wt 183 lb 9.6 oz (83.28 kg)  BMI 32.53 kg/m2  SpO2 98% Wt Readings from Last 3 Encounters:  01/06/16 183 lb 9.6 oz (83.28 kg)  10/04/15 184 lb 12.8 oz (83.825 kg)  03/11/15 189 lb (85.73 kg)   General Appearance: Well nourished, in no apparent distress. Eyes: PERRLA, EOMs, conjunctiva no  swelling or erythema Sinuses: No Frontal/maxillary tenderness ENT/Mouth: Ext aud canals clear, TMs without erythema, bulging. No erythema, swelling, or exudate on post pharynx.  Tonsils not swollen or erythematous. Hearing normal.  Neck: Supple, thyroid normal.  Respiratory: Respiratory effort normal, BS equal bilaterally without rales, rhonchi, wheezing or stridor.  Cardio: RRR with no MRGs. Brisk peripheral pulses without edema.  Abdomen: Soft, + BS.  Non tender, no guarding, rebound, hernias, masses. Lymphatics: Non tender without lymphadenopathy.  Musculoskeletal: Full ROM, 5/5 strength, normal gait.  Skin: Warm, dry without rashes, lesions, ecchymosis.  Neuro: Cranial nerves intact. Normal muscle tone, no cerebellar symptoms. Sensation intact.  Psych: Awake and oriented X 3, normal affect, Insight and Judgment appropriate.    Vicie Mutters, PA-C 11:09 AM [facility]

## 2016-01-06 NOTE — Patient Instructions (Signed)
We are starting you on Metformin to prevent or treat diabetes. Metformin does not cause low blood sugars. In order to create energy your cells need insulin and sugar but sometime your cells do not accept the insulin and this can cause increased sugars and decreased energy. The Metformin helps your cells accept insulin and the sugar to give you more energy.   The two most common side effects are nausea and diarrhea, follow these rules to avoid it! You can take imodium per box instructions when starting metformin if needed.   Rules of metformin: 1) start out slow with only one pill daily. Our goal for you is 4 pills a day or 2000mg  total.  2) take with your largest meal. 3) Take with least amount of carbs.   Call if you have any problems.   Diabetes is a very complicated disease...lets simplify it.  An easy way to look at it to understand the complications is if you think of the extra sugar floating in your blood stream as glass shards floating through your blood stream.    Diabetes affects your small vessels first: 1) The glass shards (sugar) scraps down the tiny blood vessels in your eyes and lead to diabetic retinopathy, the leading cause of blindness in the [place]. Diabetes is the leading cause of newly diagnosed adult (47 to 65 years of age) blindness in the [place].  2) The glass shards scratches down the tiny vessels of your legs leading to nerve damage called neuropathy and can lead to amputations of your feet. More than 60% of all non-traumatic amputations of lower limbs occur in people with diabetes.  3) Over time the small vessels in your brain are shredded and closed off, individually this does not cause any problems but over a long period of time many of the small vessels being blocked can lead to Vascular Dementia.   4) Your kidney's are a filter system and have a "net" that keeps certain things in the body and lets bad things out. Sugar shreds this net and leads to kidney damage  and eventually failure. Decreasing the sugar that is destroying the net and certain blood pressure medications can help stop or decrease progression of kidney disease. Diabetes was the primary cause of kidney failure in 44 percent of all new cases in 2011.  5) Diabetes also destroys the small vessels in your penis that lead to erectile dysfunction. Eventually the vessels are so damaged that you may not be responsive to cialis or viagra.   Diabetes and your large vessels: Your larger vessels consist of your coronary arteries in your heart and the carotid vessels to your brain. Diabetes or even increased sugars put you at 300% increased risk of heart attack and stroke and this is why.. The sugar scrapes down your large blood vessels and your body sees this as an internal injury and tries to repair itself. Just like you get a scab on your skin, your platelets will stick to the blood vessel wall trying to heal it. This is why we have diabetics on low dose aspirin daily, this prevents the platelets from sticking and can prevent plaque formation. In addition, your body takes cholesterol and tries to shove it into the open wound. This is why we want your LDL, or bad cholesterol, below 70.   The combination of platelets and cholesterol over 5-10 years forms plaque that can break off and cause a heart attack or stroke.   PLEASE REMEMBER:  Diabetes is  preventable! Up to 22 percent of complications and morbidities among individuals with type 2 diabetes can be prevented, delayed, or effectively treated and minimized with regular visits to a health professional, appropriate monitoring and medication, and a healthy diet and lifestyle.     Bad carbs also include fruit juice, alcohol, and sweet tea. These are empty calories that do not signal to your brain that you are full.   Please remember the good carbs are still carbs which convert into sugar. So please measure them out no more than 1/2-1 cup of rice,  oatmeal, pasta, and beans  Veggies are however free foods! Pile them on.   Not all fruit is created equal. Please see the list below, the fruit at the bottom is higher in sugars than the fruit at the top. Please avoid all dried fruits.

## 2016-01-07 LAB — VITAMIN D 25 HYDROXY (VIT D DEFICIENCY, FRACTURES): Vit D, 25-Hydroxy: 50 ng/mL (ref 30–100)

## 2016-02-06 ENCOUNTER — Ambulatory Visit (INDEPENDENT_AMBULATORY_CARE_PROVIDER_SITE_OTHER): Payer: BLUE CROSS/BLUE SHIELD | Admitting: Gynecology

## 2016-02-06 ENCOUNTER — Encounter: Payer: Self-pay | Admitting: Gynecology

## 2016-02-06 VITALS — BP 124/74 | Ht 63.0 in | Wt 183.0 lb

## 2016-02-06 DIAGNOSIS — N952 Postmenopausal atrophic vaginitis: Secondary | ICD-10-CM

## 2016-02-06 DIAGNOSIS — Z01419 Encounter for gynecological examination (general) (routine) without abnormal findings: Secondary | ICD-10-CM

## 2016-02-06 DIAGNOSIS — M8580 Other specified disorders of bone density and structure, unspecified site: Secondary | ICD-10-CM

## 2016-02-06 NOTE — Patient Instructions (Signed)

## 2016-02-06 NOTE — Progress Notes (Signed)
Shawna [name] Nov 19, 1951 [id number]        64 y.o.  G0P0  for annual exam.  Doing well without complaints  Past medical history,surgical history, problem list, medications, allergies, family history and social history were all reviewed and documented as reviewed in the EPIC chart.  ROS:  Performed with pertinent positives and negatives included in the history, assessment and plan.   Additional significant findings :  none   Exam: Shawna [name] assistant Filed Vitals:   02/06/16 1100  BP: 124/74  Height: 5\' 3"  (1.6 m)  Weight: 183 lb (83.008 kg)   General appearance:  Normal affect, orientation and appearance. Skin: Grossly normal HEENT: Without gross lesions.  No cervical or supraclavicular adenopathy. Thyroid normal.  Lungs:  Clear without wheezing, rales or rhonchi Cardiac: RR, without RMG Abdominal:  Soft, nontender, without masses, guarding, rebound, organomegaly or hernia Breasts:  Examined lying and sitting without masses, retractions, discharge or axillary adenopathy.  Well healed left lumpectomy scar Pelvic:  Ext/BUS/vagina with atrophic changes  Cervix with atrophic changes  Uterus difficulty palpate but no gross masses or tenderness  Adnexa without gross masses or tenderness    Anus and perineum normal   Rectovaginal normal sphincter tone without palpated masses or tenderness.    Assessment/Plan:  65 y.o. G0P0 female for annual exam.   1. Postmenopausal/atrophic genital changes. No significant hot flashes, night sweats, vaginal dryness or any bleeding. Continue to monitor report any issues or vaginal bleeding. 2. DEXA 11/2015 T score -1.6 stable from prior DEXA. History of Fosamax for approximately 4 years stopped 2010. Plan repeat DEXA at two-year interval. 3. Mammography 10/2015. History of DCIS left breast. Exam NED. Continue with annual mammography when due. SBE monthly review. 4. Pap smear 12/2013. No Pap smear done today. No history of significant abnormal Pap  smears. Plan repeat Pap smear at 3 year interval next year. 5. Colonoscopy 2012. Repeat at their recommended interval. 6. Health maintenance.  No routine lab work done as this is done at her primary physician's office. Follow up 1 year, sooner as needed.   Anastasio Auerbach MD, 11:24 AM 02/06/2016

## 2016-03-30 ENCOUNTER — Encounter: Payer: Self-pay | Admitting: Internal Medicine

## 2016-03-30 ENCOUNTER — Ambulatory Visit (INDEPENDENT_AMBULATORY_CARE_PROVIDER_SITE_OTHER): Payer: BLUE CROSS/BLUE SHIELD | Admitting: Internal Medicine

## 2016-03-30 VITALS — BP 126/80 | HR 88 | Temp 97.1°F | Resp 16 | Ht 63.0 in | Wt 180.4 lb

## 2016-03-30 DIAGNOSIS — Z0000 Encounter for general adult medical examination without abnormal findings: Secondary | ICD-10-CM | POA: Diagnosis not present

## 2016-03-30 DIAGNOSIS — Z136 Encounter for screening for cardiovascular disorders: Secondary | ICD-10-CM | POA: Diagnosis not present

## 2016-03-30 DIAGNOSIS — R7303 Prediabetes: Secondary | ICD-10-CM

## 2016-03-30 DIAGNOSIS — E559 Vitamin D deficiency, unspecified: Secondary | ICD-10-CM | POA: Diagnosis not present

## 2016-03-30 DIAGNOSIS — I10 Essential (primary) hypertension: Secondary | ICD-10-CM

## 2016-03-30 DIAGNOSIS — Z79899 Other long term (current) drug therapy: Secondary | ICD-10-CM

## 2016-03-30 DIAGNOSIS — R5383 Other fatigue: Secondary | ICD-10-CM

## 2016-03-30 DIAGNOSIS — Z111 Encounter for screening for respiratory tuberculosis: Secondary | ICD-10-CM | POA: Diagnosis not present

## 2016-03-30 DIAGNOSIS — Z1212 Encounter for screening for malignant neoplasm of rectum: Secondary | ICD-10-CM

## 2016-03-30 DIAGNOSIS — E782 Mixed hyperlipidemia: Secondary | ICD-10-CM

## 2016-03-30 DIAGNOSIS — Z0001 Encounter for general adult medical examination with abnormal findings: Secondary | ICD-10-CM

## 2016-03-30 LAB — CBC WITH DIFFERENTIAL/PLATELET
BASOS ABS: 0 {cells}/uL (ref 0–200)
Basophils Relative: 0 %
EOS PCT: 1 %
Eosinophils Absolute: 63 cells/uL (ref 15–500)
HCT: 42 % (ref 35.0–45.0)
Hemoglobin: 14.1 g/dL (ref 11.7–15.5)
LYMPHS ABS: 2142 {cells}/uL (ref 850–3900)
Lymphocytes Relative: 34 %
MCH: 30.5 pg (ref 27.0–33.0)
MCHC: 33.6 g/dL (ref 32.0–36.0)
MCV: 90.7 fL (ref 80.0–100.0)
MONOS PCT: 6 %
MPV: 10.5 fL (ref 7.5–12.5)
Monocytes Absolute: 378 cells/uL (ref 200–950)
NEUTROS ABS: 3717 {cells}/uL (ref 1500–7800)
NEUTROS PCT: 59 %
PLATELETS: 189 10*3/uL (ref 140–400)
RBC: 4.63 MIL/uL (ref 3.80–5.10)
RDW: 13.8 % (ref 11.0–15.0)
WBC: 6.3 10*3/uL (ref 3.8–10.8)

## 2016-03-30 LAB — LIPID PANEL
CHOL/HDL RATIO: 3.1 ratio (ref <=5.0)
CHOLESTEROL: 157 mg/dL (ref 125–200)
HDL: 50 mg/dL (ref >=46)
LDL CALC: 77 mg/dL (ref <130)
TRIGLYCERIDES: 149 mg/dL (ref <150)
VLDL: 30 mg/dL (ref <30)

## 2016-03-30 LAB — URINALYSIS, ROUTINE W REFLEX MICROSCOPIC
BILIRUBIN URINE: NEGATIVE
Glucose, UA: NEGATIVE
Hgb urine dipstick: NEGATIVE
Ketones, ur: NEGATIVE
LEUKOCYTES UA: NEGATIVE
NITRITE: NEGATIVE
PH: 7 (ref 5.0–8.0)
Protein, ur: NEGATIVE
SPECIFIC GRAVITY, URINE: 1.02 (ref 1.001–1.035)

## 2016-03-30 LAB — BASIC METABOLIC PANEL WITH GFR
BUN: 18 mg/dL (ref 7–25)
CALCIUM: 8.9 mg/dL (ref 8.6–10.4)
CO2: 22 mmol/L (ref 20–31)
CREATININE: 0.9 mg/dL (ref 0.50–0.99)
Chloride: 107 mmol/L (ref 98–110)
GFR, EST NON AFRICAN AMERICAN: 68 mL/min (ref >=60)
GFR, Est African American: 78 mL/min (ref >=60)
Glucose, Bld: 100 mg/dL — ABNORMAL HIGH (ref 65–99)
Potassium: 4.5 mmol/L (ref 3.5–5.3)
SODIUM: 141 mmol/L (ref 135–146)

## 2016-03-30 LAB — HEMOGLOBIN A1C
HEMOGLOBIN A1C: 5.8 % — AB (ref <5.7)
Mean Plasma Glucose: 120 mg/dL

## 2016-03-30 LAB — IRON AND TIBC
%SAT: 20 % (ref 11–50)
Iron: 63 ug/dL (ref 45–160)
TIBC: 320 ug/dL (ref 250–450)
UIBC: 257 ug/dL (ref 125–400)

## 2016-03-30 LAB — HEPATIC FUNCTION PANEL
ALT: 15 U/L (ref 6–29)
AST: 20 U/L (ref 10–35)
Albumin: 4.2 g/dL (ref 3.6–5.1)
Alkaline Phosphatase: 84 U/L (ref 33–130)
BILIRUBIN DIRECT: 0.1 mg/dL (ref <=0.2)
BILIRUBIN INDIRECT: 0.3 mg/dL (ref 0.2–1.2)
BILIRUBIN TOTAL: 0.4 mg/dL (ref 0.2–1.2)
Total Protein: 6.3 g/dL (ref 6.1–8.1)

## 2016-03-30 LAB — TSH: TSH: 1.46 mIU/L

## 2016-03-30 LAB — VITAMIN B12: VITAMIN B 12: 678 pg/mL (ref 200–1100)

## 2016-03-30 LAB — MAGNESIUM: Magnesium: 1.9 mg/dL (ref 1.5–2.5)

## 2016-03-30 NOTE — Patient Instructions (Signed)
Recommend Adult Low Dose Aspirin or   coated  Aspirin 81 mg daily   To reduce risk of Colon Cancer 20 %,   Skin Cancer 26 % ,   Melanoma 46%   and   Pancreatic cancer 60%   ++++++++++++++++++++++++++++++++++++++++++++++++++++++ Vitamin D goal   is between 70-100.   Please make sure that you are taking your Vitamin D as directed.   It is very important as a natural anti-inflammatory   helping hair, skin, and nails, as well as reducing stroke and heart attack risk.   It helps your bones and helps with mood.  It also decreases numerous cancer risks so please take it as directed.   Low Vit D is associated with a 200-300% higher risk for CANCER   and 200-300% higher risk for HEART   ATTACK  &  STROKE.   .....................................Marland Kitchen  It is also associated with higher death rate at younger ages,   autoimmune diseases like Rheumatoid arthritis, Lupus, Multiple Sclerosis.     Also many other serious conditions, like depression, Alzheimer's  Dementia, infertility, muscle aches, fatigue, fibromyalgia - just to name a few.  ++++++++++++++++++++++++++++++++++++++++++++++++  Recommend the book "The END of DIETING" by Dr Excell Seltzer   & the book "The END of DIABETES " by Dr Excell Seltzer  At [email or web address] - get book & Audio CD's     Being diabetic has a  300% increased risk for heart attack, stroke, cancer, and alzheimer- type vascular dementia. It is very important that you work harder with diet by avoiding all foods that are white. Avoid white rice (brown & wild rice is OK), white potatoes (sweetpotatoes in moderation is OK), White bread or wheat bread or anything made out of white flour like bagels, donuts, rolls, buns, biscuits, cakes, pastries, cookies, pizza crust, and pasta (made from white flour & egg whites) - vegetarian pasta or spinach or wheat pasta is OK. Multigrain breads like Arnold's or Pepper[employer], or multigrain sandwich thins or flatbreads.  Diet,  exercise and weight loss can reverse and cure diabetes in the early stages.  Diet, exercise and weight loss is very important in the control and prevention of complications of diabetes which affects every system in your body, ie. Brain - dementia/stroke, eyes - glaucoma/blindness, heart - heart attack/heart failure, kidneys - dialysis, stomach - gastric paralysis, intestines - malabsorption, nerves - severe painful neuritis, circulation - gangrene & loss of a leg(s), and finally cancer and Alzheimers.    I recommend avoid fried & greasy foods,  sweets/candy, white rice (brown or wild rice or Quinoa is OK), white potatoes (sweet potatoes are OK) - anything made from white flour - bagels, doughnuts, rolls, buns, biscuits,white and wheat breads, pizza crust and traditional pasta made of white flour & egg white(vegetarian pasta or spinach or wheat pasta is OK).  Multi-grain bread is OK - like multi-grain flat bread or sandwich thins. Avoid alcohol in excess. Exercise is also important.    Eat all the vegetables you want - avoid meat, especially red meat and dairy - especially cheese.  Cheese is the most concentrated form of trans-fats which is the worst thing to clog up our arteries. Veggie cheese is OK which can be found in the fresh produce section at [employer] or [employer] or [employer]  ++++++++++++++++++++++++++++++++++++++++++++++++++ DASH Eating Plan  DASH stands for "Dietary Approaches to Stop Hypertension."   The DASH eating plan is a healthy eating plan that has been shown to reduce high blood  pressure (hypertension). Additional health benefits may include reducing the risk of type 2 diabetes mellitus, heart disease, and stroke. The DASH eating plan may also help with weight loss.  WHAT DO I NEED TO KNOW ABOUT THE DASH EATING PLAN?  For the DASH eating plan, you will follow these general guidelines:  Choose foods with a percent daily value for sodium of less than 5% (as listed on the food  label).  Use salt-free seasonings or herbs instead of table salt or sea salt.  Check with your health care provider or pharmacist before using salt substitutes.  Eat lower-sodium products, often labeled as "lower sodium" or "no salt added."  Eat fresh foods.  Eat more vegetables, fruits, and low-fat dairy products.    Choose whole grains. Look for the word "whole" as the first word in the ingredient list.  Choose fish   Limit sweets, desserts, sugars, and sugary drinks.  Choose heart-healthy fats.  Eat veggie cheese   Eat more home-cooked food and less restaurant, buffet, and fast food.  Limit fried foods.  [place]uffaker foods using methods other than frying.  Limit canned vegetables. If you do use them, rinse them well to decrease the sodium.  When eating at a restaurant, ask that your food be prepared with less salt, or no salt if possible.                      WHAT FOODS CAN I EAT?  Read Dr Fara Olden Fuhrman's books on The End of Dieting & The End of Diabetes  Grains  Whole grain or whole wheat bread. Brown rice. Whole grain or whole wheat pasta. Quinoa, bulgur, and whole grain cereals. Low-sodium cereals. Corn or whole wheat flour tortillas. Whole grain cornbread. Whole grain crackers. Low-sodium crackers.  Vegetables  Fresh or frozen vegetables (raw, steamed, roasted, or grilled). Low-sodium or reduced-sodium tomato and vegetable juices. Low-sodium or reduced-sodium tomato sauce and paste. Low-sodium or reduced-sodium canned vegetables.   Fruits  All fresh, canned (in natural juice), or frozen fruits.  Protein Products   All fish and seafood.  Dried beans, peas, or lentils. Unsalted nuts and seeds. Unsalted canned beans.  Dairy  Low-fat dairy products, such as skim or 1% milk, 2% or reduced-fat cheeses, low-fat ricotta or cottage cheese, or plain low-fat yogurt. Low-sodium or reduced-sodium cheeses.  Fats and Oils  Tub margarines without trans fats. Light or  reduced-fat mayonnaise and salad dressings (reduced sodium). Avocado. Safflower, olive, or canola oils. Natural peanut or almond butter.  Other  Unsalted popcorn and pretzels. The items listed above may not be a complete list of recommended foods or beverages. Contact your dietitian for more options.  +++++++++++++++++++++++++++++++++++++++++++  WHAT FOODS ARE NOT RECOMMENDED?  Grains/ White flour or wheat flour  White bread. White pasta. White rice. Refined cornbread. Bagels and croissants. Crackers that contain trans fat.  Vegetables  Creamed or fried vegetables. Vegetables in a . Regular canned vegetables. Regular canned tomato sauce and paste. Regular tomato and vegetable juices.  Fruits  Dried fruits. Canned fruit in light or heavy syrup. Fruit juice.  Meat and Other Protein Products  Meat in general - RED mwaet & White meat.  Fatty cuts of meat. Ribs, chicken wings, bacon, sausage, bologna, salami, chitterlings, fatback, hot dogs, bratwurst, and packaged luncheon meats.  Dairy  Whole or 2% milk, cream, half-and-half, and cream cheese. Whole-fat or sweetened yogurt. Full-fat cheeses or blue cheese. Nondairy creamers and whipped toppings. Processed cheese, cheese spreads, or  cheese curds.  Condiments  Onion and garlic salt, seasoned salt, table salt, and sea salt. Canned and packaged gravies. Worcestershire sauce. Tartar sauce. Barbecue sauce. Teriyaki sauce. Soy sauce, including reduced sodium. Steak sauce. Fish sauce. Oyster sauce. Cocktail sauce. Horseradish. Ketchup and mustard. Meat flavorings and tenderizers. Bouillon cubes. Hot sauce. Tabasco sauce. Marinades. Taco seasonings. Relishes.  Fats and Oils Butter, stick margarine, lard, shortening and bacon fat. Coconut, palm kernel, or palm oils. Regular salad dressings.  Pickles and olives. Salted popcorn and pretzels.  The items listed above may not be a complete list of foods and beverages to avoid.   Preventive  Care for Adults  A healthy lifestyle and preventive care can promote health and wellness. Preventive health guidelines for women include the following key practices.  A routine yearly physical is a good way to check with your health care provider about your health and preventive screening. It is a chance to share any concerns and updates on your health and to receive a thorough exam.  Visit your dentist for a routine exam and preventive care every 6 months. Brush your teeth twice a day and floss once a day. Good oral hygiene prevents tooth decay and gum disease.  The frequency of eye exams is based on your age, health, family medical history, use of contact lenses, and other factors. Follow your health care provider's recommendations for frequency of eye exams.  Eat a healthy diet. Foods like vegetables, fruits, whole grains, low-fat dairy products, and lean protein foods contain the nutrients you need without too many calories. Decrease your intake of foods high in solid fats, added sugars, and salt. Eat the right amount of calories for you.Get information about a proper diet from your health care provider, if necessary.  Regular physical exercise is one of the most important things you can do for your health. Most adults should get at least 150 minutes of moderate-intensity exercise (any activity that increases your heart rate and causes you to sweat) each week. In addition, most adults need muscle-strengthening exercises on 2 or more days a week.  Maintain a healthy weight. The body mass index (BMI) is a screening tool to identify possible weight problems. It provides an estimate of body fat based on height and weight. Your health care provider can find your BMI and can help you achieve or maintain a healthy weight.For adults 20 years and older:  A BMI below 18.5 is considered underweight.  A BMI of 18.5 to 24.9 is normal.  A BMI of 25 to 29.9 is considered overweight.  A BMI of 30 and  above is considered obese.  Maintain normal blood lipids and cholesterol levels by exercising and minimizing your intake of saturated fat. Eat a balanced diet with plenty of fruit and vegetables. Blood tests for lipids and cholesterol should begin at age 21 and be repeated every 5 years. If your lipid or cholesterol levels are high, you are over 50, or you are at high risk for heart disease, you may need your cholesterol levels checked more frequently.Ongoing high lipid and cholesterol levels should be treated with medicines if diet and exercise are not working.  If you smoke, find out from your health care provider how to quit. If you do not use tobacco, do not start.  Lung cancer screening is recommended for adults aged 31-80 years who are at high risk for developing lung cancer because of a history of smoking. A yearly low-dose CT scan of the lungs is recommended  for people who have at least a 30-pack-year history of smoking and are a current smoker or have quit within the past 15 years. A pack year of smoking is smoking an average of 1 pack of cigarettes a day for 1 year (for example: 1 pack a day for 30 years or 2 packs a day for 15 years). Yearly screening should continue until the smoker has stopped smoking for at least 15 years. Yearly screening should be stopped for people who develop a health problem that would prevent them from having lung cancer treatment.  High blood pressure causes heart disease and increases the risk of stroke. Your blood pressure should be checked at least every 1 to 2 years. Ongoing high blood pressure should be treated with medicines if weight loss and exercise do not work.  If you are 30-80 years old, ask your health care provider if you should take aspirin to prevent strokes.  Diabetes screening involves taking a blood sample to check your fasting blood sugar level. This should be done once every 3 years, after age 61, if you are within normal weight and without risk  factors for diabetes. Testing should be considered at a younger age or be carried out more frequently if you are overweight and have at least 1 risk factor for diabetes.  Breast cancer screening is essential preventive care for women. You should practice "breast self-awareness." This means understanding the normal appearance and feel of your breasts and may include breast self-examination. Any changes detected, no matter how small, should be reported to a health care provider. Women in their 77s and 30s should have a clinical breast exam (CBE) by a health care provider as part of a regular health exam every 1 to 3 years. After age 81, women should have a CBE every year. Starting at age 20, women should consider having a mammogram (breast X-ray test) every year. Women who have a family history of breast cancer should talk to their health care provider about genetic screening. Women at a high risk of breast cancer should talk to their health care providers about having an MRI and a mammogram every year.  Breast cancer gene (BRCA)-related cancer risk assessment is recommended for women who have family members with BRCA-related cancers. BRCA-related cancers include breast, ovarian, tubal, and peritoneal cancers. Having family members with these cancers may be associated with an increased risk for harmful changes (mutations) in the breast cancer genes BRCA1 and BRCA2. Results of the assessment will determine the need for genetic counseling and BRCA1 and BRCA2 testing.  Routine pelvic exams to screen for cancer are no longer recommended for nonpregnant women who are considered low risk for cancer of the pelvic organs (ovaries, uterus, and vagina) and who do not have symptoms. Ask your health care provider if a screening pelvic exam is right for you.  If you have had past treatment for cervical cancer or a condition that could lead to cancer, you need Pap tests and screening for cancer for at least 20 years after  your treatment. If Pap tests have been discontinued, your risk factors (such as having a new sexual partner) need to be reassessed to determine if screening should be resumed. Some women have medical problems that increase the chance of getting cervical cancer. In these cases, your health care provider may recommend more frequent screening and Pap tests.  Colorectal cancer can be detected and often prevented. Most routine colorectal cancer screening begins at the age of 22 years and  continues through age 74 years. However, your health care provider may recommend screening at an earlier age if you have risk factors for colon cancer. On a yearly basis, your health care provider may provide home test kits to check for hidden blood in the stool. Use of a small camera at the end of a tube, to directly examine the colon (sigmoidoscopy or colonoscopy), can detect the earliest forms of colorectal cancer. Talk to your health care provider about this at age 22, when routine screening begins. Direct exam of the colon should be repeated every 5-10 years through age 76 years, unless early forms of pre-cancerous polyps or small growths are found.  Hepatitis C blood testing is recommended for all people born from [birth year] through 1965 and any individual with known risks for hepatitis C.  Pra  Osteoporosis is a disease in which the bones lose minerals and strength with aging. This can result in serious bone fractures or breaks. The risk of osteoporosis can be identified using a bone density scan. Women ages 88 years and over and women at risk for fractures or osteoporosis should discuss screening with their health care providers. Ask your health care provider whether you should take a calcium supplement or vitamin D to reduce the rate of osteoporosis.  Menopause can be associated with physical symptoms and risks. Hormone replacement therapy is available to decrease symptoms and risks. You should talk to your health care  provider about whether hormone replacement therapy is right for you.  Use sunscreen. Apply sunscreen liberally and repeatedly throughout the day. You should seek shade when your shadow is shorter than you. Protect yourself by wearing long sleeves, pants, a wide-brimmed hat, and sunglasses year round, whenever you are outdoors.  Once a month, do a whole body skin exam, using a mirror to look at the skin on your back. Tell your health care provider of new moles, moles that have irregular borders, moles that are larger than a pencil eraser, or moles that have changed in shape or color.  Stay current with required vaccines (immunizations).  Influenza vaccine. All adults should be immunized every year.  Tetanus, diphtheria, and acellular pertussis (Td, Tdap) vaccine. Pregnant women should receive 1 dose of Tdap vaccine during each pregnancy. The dose should be obtained regardless of the length of time since the last dose. Immunization is preferred during the 27th-36th week of gestation. An adult who has not previously received Tdap or who does not know her vaccine status should receive 1 dose of Tdap. This initial dose should be followed by tetanus and diphtheria toxoids (Td) booster doses every 10 years. Adults with an unknown or incomplete history of completing a 3-dose immunization series with Td-containing vaccines should begin or complete a primary immunization series including a Tdap dose. Adults should receive a Td booster every 10 years.  Varicella vaccine. An adult without evidence of immunity to varicella should receive 2 doses or a second dose if she has previously received 1 dose. Pregnant females who do not have evidence of immunity should receive the first dose after pregnancy. This first dose should be obtained before leaving the health care facility. The second dose should be obtained 4-8 weeks after the first dose.  Human papillomavirus (HPV) vaccine. Females aged 13-26 years who have not  received the vaccine previously should obtain the 3-dose series. The vaccine is not recommended for use in pregnant females. However, pregnancy testing is not needed before receiving a dose. If a female is found to  be pregnant after receiving a dose, no treatment is needed. In that case, the remaining doses should be delayed until after the pregnancy. Immunization is recommended for any person with an immunocompromised condition through the age of 14 years if she did not get any or all doses earlier. During the 3-dose series, the second dose should be obtained 4-8 weeks after the first dose. The third dose should be obtained 24 weeks after the first dose and 16 weeks after the second dose.  Zoster vaccine. One dose is recommended for adults aged 87 years or older unless certain conditions are present.  Measles, mumps, and rubella (MMR) vaccine. Adults born before [birth year] generally are considered immune to measles and mumps. Adults born in [birth year] or later should have 1 or more doses of MMR vaccine unless there is a contraindication to the vaccine or there is laboratory evidence of immunity to each of the three diseases. A routine second dose of MMR vaccine should be obtained at least 28 days after the first dose for students attending postsecondary schools, health care workers, or international travelers. People who received inactivated measles vaccine or an unknown type of measles vaccine during 1963-1967 should receive 2 doses of MMR vaccine. People who received inactivated mumps vaccine or an unknown type of mumps vaccine before 1979 and are at high risk for mumps infection should consider immunization with 2 doses of MMR vaccine. For females of childbearing age, rubella immunity should be determined. If there is no evidence of immunity, females who are not pregnant should be vaccinated. If there is no evidence of immunity, females who are pregnant should delay immunization until after pregnancy. Unvaccinated  health care workers born before [birth year] who lack laboratory evidence of measles, mumps, or rubella immunity or laboratory confirmation of disease should consider measles and mumps immunization with 2 doses of MMR vaccine or rubella immunization with 1 dose of MMR vaccine.  Pneumococcal 13-valent conjugate (PCV13) vaccine. When indicated, a person who is uncertain of her immunization history and has no record of immunization should receive the PCV13 vaccine. An adult aged 48 years or older who has certain medical conditions and has not been previously immunized should receive 1 dose of PCV13 vaccine. This PCV13 should be followed with a dose of pneumococcal polysaccharide (PPSV23) vaccine. The PPSV23 vaccine dose should be obtained at least 8 weeks after the dose of PCV13 vaccine. An adult aged 40 years or older who has certain medical conditions and previously received 1 or more doses of PPSV23 vaccine should receive 1 dose of PCV13. The PCV13 vaccine dose should be obtained 1 or more years after the last PPSV23 vaccine dose.    Pneumococcal polysaccharide (PPSV23) vaccine. When PCV13 is also indicated, PCV13 should be obtained first. All adults aged 66 years and older should be immunized. An adult younger than age 28 years who has certain medical conditions should be immunized. Any person who resides in a nursing home or long-term care facility should be immunized. An adult smoker should be immunized. People with an immunocompromised condition and certain other conditions should receive both PCV13 and PPSV23 vaccines. People with human immunodeficiency virus (HIV) infection should be immunized as soon as possible after diagnosis. Immunization during chemotherapy or radiation therapy should be avoided. Routine use of PPSV23 vaccine is not recommended for American Indians, [place] Natives, or people younger than 65 years unless there are medical conditions that require PPSV23 vaccine. When indicated, people who  have unknown immunization and have no record of  immunization should receive PPSV23 vaccine. One-time revaccination 5 years after the first dose of PPSV23 is recommended for people aged 19-64 years who have chronic kidney failure, nephrotic syndrome, asplenia, or immunocompromised conditions. People who received 1-2 doses of PPSV23 before age 48 years should receive another dose of PPSV23 vaccine at age 20 years or later if at least 5 years have passed since the previous dose. Doses of PPSV23 are not needed for people immunized with PPSV23 at or after age 49 years.  Preventive Services / Frequency   Ages 88 to 80 years  Blood pressure check.  Lipid and cholesterol check.  Lung cancer screening. / Every year if you are aged 38-80 years and have a 30-pack-year history of smoking and currently smoke or have quit within the past 15 years. Yearly screening is stopped once you have quit smoking for at least 15 years or develop a health problem that would prevent you from having lung cancer treatment.  Clinical breast exam.** / Every year after age 8 years.  BRCA-related cancer risk assessment.** / For women who have family members with a BRCA-related cancer (breast, ovarian, tubal, or peritoneal cancers).  Mammogram.** / Every year beginning at age 27 years and continuing for as long as you are in good health. Consult with your health care provider.  Pap test.** / Every 3 years starting at age 12 years through age 12 or 42 years with a history of 3 consecutive normal Pap tests.  HPV screening.** / Every 3 years from ages 78 years through ages 14 to 22 years with a history of 3 consecutive normal Pap tests.  Fecal occult blood test (FOBT) of stool. / Every year beginning at age 64 years and continuing until age 78 years. You may not need to do this test if you get a colonoscopy every 10 years.  Flexible sigmoidoscopy or colonoscopy.** / Every 5 years for a flexible sigmoidoscopy or every 10 years  for a colonoscopy beginning at age 58 years and continuing until age 59 years.  Hepatitis C blood test.** / For all people born from [birth year] through 1965 and any individual with known risks for hepatitis C.  Skin self-exam. / Monthly.  Influenza vaccine. / Every year.  Tetanus, diphtheria, and acellular pertussis (Tdap/Td) vaccine.** / Consult your health care provider. Pregnant women should receive 1 dose of Tdap vaccine during each pregnancy. 1 dose of Td every 10 years.  Varicella vaccine.** / Consult your health care provider. Pregnant females who do not have evidence of immunity should receive the first dose after pregnancy.  Zoster vaccine.** / 1 dose for adults aged 28 years or older.  Pneumococcal 13-valent conjugate (PCV13) vaccine.** / Consult your health care provider.  Pneumococcal polysaccharide (PPSV23) vaccine.** / 1 to 2 doses if you smoke cigarettes or if you have certain conditions.  Meningococcal vaccine.** / Consult your health care provider.  Hepatitis A vaccine.** / Consult your health care provider.  Hepatitis B vaccine.** / Consult your health care provider. Screening for abdominal aortic aneurysm (AAA)  by ultrasound is recommended for people over 50 who have history of high blood pressure or who are current or former smokers.

## 2016-03-30 NOTE — Progress Notes (Signed)
Patient ID: Shawna [name], female   DOB: 10-26-1951, 65 y.o.   MRN: [id number]  Annual Screening/Preventative Visit And Comprehensive Evaluation &  Examination  This very nice 65 y.o.female presents for a Wellness/Preventative Visit & comprehensive evaluation and management of multiple medical co-morbidities.  Patient has been followed for HTN, T2_NIDDM  Prediabetes, Hyperlipidemia and Vitamin D Deficiency. Patient is s/p Lumpectomy of L Breast for DCIS in 2006.     HTN predates circa 2010. Patient's BP has been controlled at home and patient denies any cardiac symptoms as chest pain, palpitations, shortness of breath, dizziness or ankle swelling. Today's BP: 126/80 mmHg    Patient's hyperlipidemia is controlled with diet and medications. Patient denies myalgias or other medication SE's. Last lipids were at goal with Cholesterol 168; HDL 52; LDL 91; Triglycerides 123 on 01/06/2016.   Patient has Morbid Obesity (BMI 32) and consequent  prediabetes with A1c 5.8%  predating since May 2010 and then 6.1% in Dec 2011 and patient denies reactive hypoglycemic symptoms, visual blurring, diabetic polys, or paresthesias. Last A1c was 5.9% on 01/06/2016.   Finally, patient has history of Vitamin D Deficiency of "36" in 2008  and last Vitamin D was  50 on 01/06/2016.   Medication Sig  . aspirin 81 MG  Take 81 mg by mouth daily.  Marland Kitchen atorvastatin  80 MG TAKE ONE TABLET BY MOUTH ONCE DAILY FOR CHOLESTEROL  . CALTRATE 600+D 600-800 Chew 1 Units by mouth daily.  Marland Kitchen VITAMIN D  Take 2,000 Units by mouth. Takes 8000 iu daily  . Magnesium 250 MG  Take by mouth daily.  . Multiple Vitamin  Take 1 tablet by mouth daily.     Allergies  Allergen Reactions  . Nitrofurantoin Monohyd Macro Nausea Only   Past Medical History  Diagnosis Date  . Hypercholesteremia   . 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 Serous cyst of ovary     left  . Osteopenia 11/2015    T score -1.6 stable from prior DEXA  . Cancer  [facility])     Breast   Health Maintenance  Topic Date Due  . FOOT EXAM  06/19/1961  . OPHTHALMOLOGY EXAM  06/19/1961  . COLONOSCOPY  06/19/2001  . PAP SMEAR  12/27/2014  . URINE MICROALBUMIN  03/10/2016  . HEMOGLOBIN A1C  07/05/2016  . INFLUENZA VACCINE  07/07/2016  . MAMMOGRAM  11/03/2017  . TETANUS/TDAP  09/14/2019  . ZOSTAVAX  Completed  . Hepatitis C Screening  Completed  . HIV Screening  Completed   Immunization History  Administered Date(s) Administered  . DTaP 06/26/1999  . Influenza Split 09/05/2014, 10/04/2015  . Influenza Whole 09/01/2013  . PPD Test 03/05/2014, 03/11/2015  . Pneumococcal Polysaccharide-23 02/04/2006  . Tdap 09/13/2009  . Zoster 10/04/2015   Past Surgical History  Procedure Laterality Date  . Breast lumpectomy  2006  . Salpingoophorectomy  1978    left   Family History  Problem Relation Age of Onset  . Hypertension Mother   . Colon cancer Father   . Diabetes Sister   . Heart disease Sister   . Cancer Sister     ? Type  . Breast cancer Paternal Grandmother     Age 40's   Social History  Substance Use Topics  . Smoking status: Never Smoker   . Smokeless tobacco: None  . Alcohol Use: No    ROS Constitutional: Denies fever, chills, weight loss/gain, headaches, insomnia,  night sweats, and  change in appetite. Does c/o fatigue. Eyes: Denies redness, blurred vision, diplopia, discharge, itchy, watery eyes.  ENT: Denies discharge, congestion, post nasal drip, epistaxis, sore throat, earache,  dental pain, Tinnitus, Vertigo, Sinus pain, snoring. Has hearing loss & use Bilat hearing aids. Cardio: Denies chest pain, palpitations, irregular heartbeat, syncope, dyspnea, diaphoresis, orthopnea, PND, claudication, edema Respiratory: denies cough, dyspnea, DOE, pleurisy, hoarseness, laryngitis, wheezing.  Gastrointestinal: Denies dysphagia, heartburn, reflux, water brash, pain, cramps, nausea, vomiting, bloating, diarrhea, constipation, hematemesis,  melena, hematochezia, jaundice, hemorrhoids Genitourinary: Denies dysuria, frequency, urgency, nocturia, hesitancy, discharge, hematuria, flank pain Breast:  Denies Breast lumps, nipple discharge, bleeding.  Musculoskeletal: Denies arthralgia, myalgia, stiffness, Jt. Swelling, pain, limp, and strain/sprain. Denies falls. Skin: Denies puritis, rash, hives, warts, acne, eczema, changing in skin lesion Neuro: No weakness, tremor, incoordination, spasms, paresthesia, pain Psychiatric: Denies confusion, memory loss, sensory loss. Denies Depression. Endocrine: Denies change in weight, skin, hair change, nocturia, and paresthesia, diabetic polys, visual blurring, hyper / hypo glycemic episodes.  Heme/Lymph: No excessive bleeding, bruising, enlarged lymph nodes.  Physical Exam  BP 126/80 mmHg  Pulse 88  Temp(Src) 97.1 F (36.2 C)  Resp 16  Ht 5\' 3"  (1.6 m)  Wt 180 lb 6.4 oz (81.829 kg)  BMI 31.96 kg/m2  General Appearance: Over nourished with central obesity and in no apparent distress.  Eyes: PERRLA, EOMs, conjunctiva no swelling or erythema, normal fundi and vessels. Sinuses: No frontal/maxillary tenderness ENT/Mouth: EACs patent / TMs  nl. Nares clear without erythema, swelling, mucoid exudates. Oral hygiene is good. No erythema, swelling, or exudate. Tongue normal, non-obstructing. Tonsils not swollen or erythematous. Hearing normal.  Neck: Supple, thyroid normal. No bruits, nodes or JVD. Respiratory: Respiratory effort normal.  BS equal and clear bilateral without rales, rhonci, wheezing or stridor. Cardio: Heart sounds are normal with regular rate and rhythm and no murmurs, rubs or gallops. Peripheral pulses are normal and equal bilaterally without edema. No aortic or femoral bruits. Chest: symmetric with normal excursions and percussion. Breasts: Symmetric, without lumps, nipple discharge, retractions, or fibrocystic changes.  Abdomen: Flat, soft, with bowel sounds. Nontender, no  guarding, rebound, hernias, masses, or organomegaly.  Lymphatics: Non tender without lymphadenopathy.  Genitourinary:  Musculoskeletal: Full ROM all peripheral extremities, joint stability, 5/5 strength, and normal gait. Skin: Warm and dry without rashes, lesions, cyanosis, clubbing or  ecchymosis.  Neuro: Cranial nerves intact, reflexes equal bilaterally. Normal muscle tone, no cerebellar symptoms. Sensation intact.  Pysch: Alert and oriented X 3, normal affect, Insight and Judgment appropriate.   Assessment and Plan  1. Annual Preventative Screening Examination  2. Essential hypertension  - EKG 12-Lead - [place], RETROPERITNL ABD,  LTD - TSH  3. Hyperlipidemia  - Microalbumin / creatinine urine ratio - Lipid panel - TSH  4. Prediabetes  - Hemoglobin A1c - Insulin, random  5. Vitamin D deficiency  - VITAMIN D 25 Hydroxy  6. Screening for rectal cancer  - POC Hemoccult Bld/Stl  7. Other fatigue  - Vitamin B12 - Iron and TIBC - CBC with Differential/Platelet  8. Medication management  - Urinalysis, Routine w reflex microscopic - CBC with Differential/Platelet - BASIC METABOLIC PANEL WITH GFR - Hepatic function panel - Magnesium  9. Screening for ischemic heart disease   10. Screening for AAA (aortic abdominal aneurysm   Continue prudent diet as discussed, weight control, BP monitoring, regular exercise, and medications. Discussed med's effects and SE's. Screening labs and tests as requested with regular follow-up as recommended. Over 40 minutes of exam,  counseling, chart review and high complex critical decision making was performed.

## 2016-03-31 LAB — MICROALBUMIN / CREATININE URINE RATIO
Creatinine, Urine: 152 mg/dL (ref 20–320)
MICROALB UR: 0.6 mg/dL
MICROALB/CREAT RATIO: 4 ug/mg{creat} (ref <30)

## 2016-03-31 LAB — INSULIN, RANDOM: Insulin: 20.6 u[IU]/mL — ABNORMAL HIGH (ref 2.0–19.6)

## 2016-03-31 LAB — VITAMIN D 25 HYDROXY (VIT D DEFICIENCY, FRACTURES): Vit D, 25-Hydroxy: 54 ng/mL (ref 30–100)

## 2016-04-01 LAB — TB SKIN TEST
INDURATION: 0 mm
TB Skin Test: NEGATIVE

## 2016-07-07 ENCOUNTER — Encounter: Payer: Self-pay | Admitting: Internal Medicine

## 2016-07-07 ENCOUNTER — Ambulatory Visit (INDEPENDENT_AMBULATORY_CARE_PROVIDER_SITE_OTHER): Payer: Medicare Other | Admitting: Internal Medicine

## 2016-07-07 ENCOUNTER — Other Ambulatory Visit: Payer: Self-pay

## 2016-07-07 ENCOUNTER — Other Ambulatory Visit: Payer: Self-pay | Admitting: Internal Medicine

## 2016-07-07 VITALS — BP 126/84 | HR 82 | Temp 97.3°F | Resp 16 | Ht 63.0 in | Wt 185.2 lb

## 2016-07-07 DIAGNOSIS — Z79899 Other long term (current) drug therapy: Secondary | ICD-10-CM

## 2016-07-07 DIAGNOSIS — I10 Essential (primary) hypertension: Secondary | ICD-10-CM

## 2016-07-07 DIAGNOSIS — E559 Vitamin D deficiency, unspecified: Secondary | ICD-10-CM | POA: Diagnosis not present

## 2016-07-07 DIAGNOSIS — R7303 Prediabetes: Secondary | ICD-10-CM | POA: Diagnosis not present

## 2016-07-07 DIAGNOSIS — E782 Mixed hyperlipidemia: Secondary | ICD-10-CM

## 2016-07-07 LAB — BASIC METABOLIC PANEL WITH GFR
BUN: 15 mg/dL (ref 7–25)
CALCIUM: 9.4 mg/dL (ref 8.6–10.4)
CO2: 22 mmol/L (ref 20–31)
Chloride: 108 mmol/L (ref 98–110)
Creat: 0.79 mg/dL (ref 0.50–0.99)
GFR, EST NON AFRICAN AMERICAN: 79 mL/min (ref >=60)
Glucose, Bld: 103 mg/dL — ABNORMAL HIGH (ref 65–99)
Potassium: 4.7 mmol/L (ref 3.5–5.3)
SODIUM: 141 mmol/L (ref 135–146)

## 2016-07-07 LAB — CBC WITH DIFFERENTIAL/PLATELET
BASOS PCT: 0 %
Basophils Absolute: 0 cells/uL (ref 0–200)
EOS PCT: 2 %
Eosinophils Absolute: 140 cells/uL (ref 15–500)
HCT: 40.9 % (ref 35.0–45.0)
HEMOGLOBIN: 13.8 g/dL (ref 11.7–15.5)
LYMPHS ABS: 2030 {cells}/uL (ref 850–3900)
Lymphocytes Relative: 29 %
MCH: 30.9 pg (ref 27.0–33.0)
MCHC: 33.7 g/dL (ref 32.0–36.0)
MCV: 91.7 fL (ref 80.0–100.0)
MPV: 10 fL (ref 7.5–12.5)
Monocytes Absolute: 420 cells/uL (ref 200–950)
Monocytes Relative: 6 %
NEUTROS PCT: 63 %
Neutro Abs: 4410 cells/uL (ref 1500–7800)
Platelets: 186 10*3/uL (ref 140–400)
RBC: 4.46 MIL/uL (ref 3.80–5.10)
RDW: 13.9 % (ref 11.0–15.0)
WBC: 7 10*3/uL (ref 3.8–10.8)

## 2016-07-07 LAB — HEPATIC FUNCTION PANEL
ALT: 20 U/L (ref 6–29)
AST: 25 U/L (ref 10–35)
Albumin: 4.3 g/dL (ref 3.6–5.1)
Alkaline Phosphatase: 88 U/L (ref 33–130)
BILIRUBIN DIRECT: 0.1 mg/dL (ref <=0.2)
BILIRUBIN INDIRECT: 0.4 mg/dL (ref 0.2–1.2)
Total Bilirubin: 0.5 mg/dL (ref 0.2–1.2)
Total Protein: 6.5 g/dL (ref 6.1–8.1)

## 2016-07-07 LAB — LIPID PANEL
CHOL/HDL RATIO: 2.5 ratio (ref <=5.0)
Cholesterol: 147 mg/dL (ref 125–200)
HDL: 60 mg/dL (ref >=46)
LDL CALC: 66 mg/dL (ref <130)
TRIGLYCERIDES: 104 mg/dL (ref <150)
VLDL: 21 mg/dL (ref <30)

## 2016-07-07 LAB — HEMOGLOBIN A1C
HEMOGLOBIN A1C: 5.9 % — AB (ref <5.7)
Mean Plasma Glucose: 123 mg/dL

## 2016-07-07 MED ORDER — ATORVASTATIN CALCIUM 80 MG PO TABS: ORAL_TABLET | ORAL | 2 refills | Status: DC

## 2016-07-07 NOTE — Progress Notes (Signed)
Patient ID: Shawna [name], female   DOB: 1951/06/03, 65 y.o.   MRN: [id number]  Assessment and Plan:  Hypertension:  -Continue medication,  -monitor blood pressure at home.  -Continue DASH diet.   -Reminder to go to the ER if any CP, SOB, nausea, dizziness, severe HA, changes vision/speech, left arm numbness and tingling, and jaw pain.  Cholesterol: -Continue diet and exercise.  -Check cholesterol.   Pre-diabetes: -Continue diet and exercise.  -Check A1C  Vitamin D Def: -check level -continue medications.   Obesity -cont diet and exercise.   -not interested in medications.   Continue diet and meds as discussed. Further disposition pending results of labs.  HPI 65 y.o. female  presents for 3 month follow up with hypertension, hyperlipidemia, prediabetes and vitamin D.   Her blood pressure has been controlled at home, today their BP is BP: 126/84.   She does workout. She denies chest pain, shortness of breath, dizziness.  She is not exercising much.  She does walk when she has the opportunity to walk on her treadmill.     She is on cholesterol medication and denies myalgias. Her cholesterol is at goal. The cholesterol last visit was:   Lab Results  Component Value Date   CHOL 157 03/30/2016   HDL 50 03/30/2016   LDLCALC 77 03/30/2016   TRIG 149 03/30/2016   CHOLHDL 3.1 03/30/2016  She tolerates the medication well.  No issues.     She has been working on diet and exercise for prediabetes, and denies foot ulcerations, hyperglycemia, hypoglycemia , increased appetite, nausea, paresthesia of the feet, polydipsia, polyuria, visual disturbances, vomiting and weight loss. Last A1C in the office was:  Lab Results  Component Value Date   HGBA1C 5.8 (H) 03/30/2016    Patient is on Vitamin D supplement.  Lab Results  Component Value Date   VD25OH 41 03/30/2016     She is without complaints today.  She is taking care of her husband who has an LVAD and is not having issues.     Current Medications:  Current Outpatient Prescriptions on File Prior to Visit  Medication Sig Dispense Refill  . aspirin 81 MG tablet Take 81 mg by mouth daily.    . Calcium Carb-Cholecalciferol (CALTRATE 600+D3 SOFT) 600-800 MG-UNIT CHEW Chew 1 Units by mouth daily.    . Cholecalciferol (VITAMIN D PO) Take 2,000 Units by mouth. Takes 8000 iu daily    . Magnesium 250 MG TABS Take by mouth daily.    . Multiple Vitamin (MULTIVITAMIN) tablet Take 1 tablet by mouth daily.       No current facility-administered medications on file prior to visit.     Medical History:  Past Medical History:  Diagnosis Date  . Cancer (HCC)    Breast  . 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 Hypercholesteremia   . Osteopenia 11/2015   T score -1.6 stable from prior DEXA  . Serous cyst of ovary    left    Allergies:  Allergies  Allergen Reactions  . Nitrofurantoin Monohyd Macro Nausea Only     Review of Systems:  Review of Systems  Constitutional: Negative for chills, fever and malaise/fatigue.  HENT: Negative for congestion, ear pain and sore throat.   Eyes: Negative.   Respiratory: Negative for cough, shortness of breath and wheezing.   Cardiovascular: Negative for chest pain, palpitations and leg swelling.  Gastrointestinal: Negative for abdominal pain, blood in stool, constipation, diarrhea, heartburn  and melena.  Genitourinary: Negative.   Skin: Negative.   Neurological: Negative for dizziness, sensory change, loss of consciousness and headaches.  Psychiatric/Behavioral: Negative for depression. The patient is not nervous/anxious and does not have insomnia.     Family history- Review and unchanged  Social history- Review and unchanged  Physical Exam: BP 126/84   Pulse 82   Temp 97.3 F (36.3 C) (Temporal)   Resp 16   Ht 5\' 3"  (1.6 m)   Wt 185 lb 3.2 oz (84 kg)   SpO2 96%   BMI 32.81 kg/m  Wt Readings from Last 3 Encounters:  07/07/16 185 lb 3.2 oz (84 kg)   03/30/16 180 lb 6.4 oz (81.8 kg)  02/06/16 183 lb (83 kg)    General Appearance: Well nourished well developed, in no apparent distress. Eyes: PERRLA, EOMs, conjunctiva no swelling or erythema ENT/Mouth: Ear canals normal without obstruction, swelling, erythma, discharge.  TMs normal bilaterally.  Oropharynx moist, clear, without exudate, or postoropharyngeal swelling. Neck: Supple, thyroid normal,no cervical adenopathy  Respiratory: Respiratory effort normal, Breath sounds clear A&P without rhonchi, wheeze, or rale.  No retractions, no accessory usage. Cardio: RRR with no MRGs. Brisk peripheral pulses without edema.  Abdomen: Soft, + BS,  Non tender, no guarding, rebound, hernias, masses. Musculoskeletal: Full ROM, 5/5 strength, Normal gait Skin: Warm, dry without rashes, lesions, ecchymosis.  Neuro: Awake and oriented X 3, Cranial nerves intact. Normal muscle tone, no cerebellar symptoms. Psych: Normal affect, Insight and Judgment appropriate.    Terri Piedra, PA-C 9:52 AM [facility]

## 2016-07-08 ENCOUNTER — Other Ambulatory Visit: Payer: Self-pay | Admitting: Internal Medicine

## 2016-07-09 LAB — TSH: TSH: 1.24 m[IU]/L

## 2016-08-26 DIAGNOSIS — H2513 Age-related nuclear cataract, bilateral: Secondary | ICD-10-CM | POA: Diagnosis not present

## 2016-09-09 DIAGNOSIS — Z23 Encounter for immunization: Secondary | ICD-10-CM | POA: Diagnosis not present

## 2016-10-01 ENCOUNTER — Other Ambulatory Visit: Payer: Self-pay | Admitting: Internal Medicine

## 2016-10-01 DIAGNOSIS — Z1231 Encounter for screening mammogram for malignant neoplasm of breast: Secondary | ICD-10-CM

## 2016-10-08 ENCOUNTER — Ambulatory Visit: Payer: Self-pay | Admitting: Internal Medicine

## 2016-11-04 ENCOUNTER — Ambulatory Visit
Admission: RE | Admit: 2016-11-04 | Discharge: 2016-11-04 | Disposition: A | Discharge status: Home / Self Care | Payer: Medicare Other | Source: Ambulatory Visit | Attending: Internal Medicine | Admitting: Internal Medicine

## 2016-11-04 DIAGNOSIS — Z1231 Encounter for screening mammogram for malignant neoplasm of breast: Secondary | ICD-10-CM

## 2016-11-13 ENCOUNTER — Other Ambulatory Visit: Payer: Self-pay | Admitting: *Deleted

## 2016-11-13 MED ORDER — AZITHROMYCIN 250 MG PO TABS
ORAL_TABLET | ORAL | 0 refills | Status: AC
Start: 2016-11-13 — End: 2016-11-18

## 2016-11-20 ENCOUNTER — Telehealth: Payer: Self-pay | Admitting: *Deleted

## 2016-11-20 MED ORDER — LEVOFLOXACIN 500 MG PO TABS: 500.0000 mg | ORAL_TABLET | Freq: Every day | ORAL | 0 refills | Status: AC

## 2016-11-20 NOTE — Telephone Encounter (Signed)
Patient called stating she completed Zpak Dr. Melford Aase gave her last week for URI symptoms.  States symptoms still present and is requesting additional abx for Tx.  Per Dr. Idell Pickles orders, [place] sent into pharmacy for patient and advised her to call for OV if no relief from symptoms after completing abx.

## 2017-02-11 ENCOUNTER — Ambulatory Visit (INDEPENDENT_AMBULATORY_CARE_PROVIDER_SITE_OTHER): Payer: Medicare Other | Admitting: Gynecology

## 2017-02-11 ENCOUNTER — Encounter: Payer: Self-pay | Admitting: Gynecology

## 2017-02-11 VITALS — BP 124/78 | Ht 63.0 in | Wt 185.0 lb

## 2017-02-11 DIAGNOSIS — M8580 Other specified disorders of bone density and structure, unspecified site: Secondary | ICD-10-CM

## 2017-02-11 DIAGNOSIS — Z01411 Encounter for gynecological examination (general) (routine) with abnormal findings: Secondary | ICD-10-CM

## 2017-02-11 DIAGNOSIS — Z124 Encounter for screening for malignant neoplasm of cervix: Secondary | ICD-10-CM | POA: Diagnosis not present

## 2017-02-11 DIAGNOSIS — N952 Postmenopausal atrophic vaginitis: Secondary | ICD-10-CM

## 2017-02-11 NOTE — Addendum Note (Signed)
Addended by: Nelva Nay on: 02/11/2017 03:41 PM   Modules accepted: Orders

## 2017-02-11 NOTE — Progress Notes (Signed)
Shawna [name] 1951-05-06 [id number]        66 y.o.  G0P0 for breast and pelvic exam  Past medical history,surgical history, problem list, medications, allergies, family history and social history were all reviewed and documented as reviewed in the EPIC chart.  ROS:  Performed with pertinent positives and negatives included in the history, assessment and plan.   Additional significant findings :  None   Exam: Kennon Portela assistant Vitals:   02/11/17 1431  BP: 124/78  Weight: 185 lb (83.9 kg)  Height: 5\' 3"  (1.6 m)   Body mass index is 32.77 kg/m.  General appearance:  Normal affect, orientation and appearance. Skin: Grossly normal HEENT: Without gross lesions.  No cervical or supraclavicular adenopathy. Thyroid normal.  Lungs:  Clear without wheezing, rales or rhonchi Cardiac: RR, without RMG Abdominal:  Soft, nontender, without masses, guarding, rebound, organomegaly or hernia Breasts:  Examined lying and sitting without masses, retractions, discharge or axillary adenopathy. Pelvic:  Ext, BUS, Vagina: With atrophic changes  Cervix: With atrophic changes  Uterus: Anteverted, normal size, shape and contour, midline and mobile nontender   Adnexa: Without masses or tenderness    Anus and perineum: Normal   Rectovaginal: Normal sphincter tone without palpated masses or tenderness.    Assessment/Plan:  66 y.o. G0P0 female for breast and pelvic exam.   1. Postmenopausal/atrophic genital changes. No significant hot flushes, night sweats, vaginal dryness or bleeding. Report any issues or bleeding. 2. Osteopenia. DEXA 11/2015 T score -1.6 stable from prior DEXA. Used Fosamax for approximately 4 years stopped in 2010. Plan repeat DEXA next year. 3. History of DCIS left breast. Exam NED. Mammography 10/2016. Continue with annual mammography when due. SBE monthly reviewed. 4. Pap smear 12/2013. Pap smear done today. No history of significant abnormal Pap smears. Current screening  guidelines reviewed. 5. Colonoscopy overdue and patient knows to call and schedule. Last colonoscopy 2012 and she is reportedly 5 year interval. 6. Health maintenance. No lab work done as patient does this at her primary physician's office. Follow up 1 year, sooner as needed.   Dara Lords MD, 2:52 PM 02/11/2017

## 2017-02-11 NOTE — Patient Instructions (Signed)

## 2017-02-12 LAB — PAP IG W/ RFLX HPV ASCU

## 2017-04-22 ENCOUNTER — Ambulatory Visit (INDEPENDENT_AMBULATORY_CARE_PROVIDER_SITE_OTHER): Payer: Medicare Other | Admitting: Internal Medicine

## 2017-04-22 ENCOUNTER — Encounter: Payer: Self-pay | Admitting: Internal Medicine

## 2017-04-22 VITALS — BP 122/82 | HR 76 | Temp 97.5°F | Resp 16 | Ht 63.0 in | Wt 185.2 lb

## 2017-04-22 DIAGNOSIS — R7303 Prediabetes: Secondary | ICD-10-CM

## 2017-04-22 DIAGNOSIS — Z79899 Other long term (current) drug therapy: Secondary | ICD-10-CM | POA: Diagnosis not present

## 2017-04-22 DIAGNOSIS — I10 Essential (primary) hypertension: Secondary | ICD-10-CM

## 2017-04-22 DIAGNOSIS — E782 Mixed hyperlipidemia: Secondary | ICD-10-CM | POA: Diagnosis not present

## 2017-04-22 DIAGNOSIS — E559 Vitamin D deficiency, unspecified: Secondary | ICD-10-CM | POA: Diagnosis not present

## 2017-04-22 DIAGNOSIS — Z23 Encounter for immunization: Secondary | ICD-10-CM | POA: Diagnosis not present

## 2017-04-22 DIAGNOSIS — Z1212 Encounter for screening for malignant neoplasm of rectum: Secondary | ICD-10-CM

## 2017-04-22 DIAGNOSIS — Z136 Encounter for screening for cardiovascular disorders: Secondary | ICD-10-CM

## 2017-04-22 LAB — HEPATIC FUNCTION PANEL
ALBUMIN: 4.2 g/dL (ref 3.6–5.1)
ALT: 13 U/L (ref 6–29)
AST: 16 U/L (ref 10–35)
Alkaline Phosphatase: 79 U/L (ref 33–130)
Bilirubin, Direct: 0.1 mg/dL (ref <=0.2)
Indirect Bilirubin: 0.4 mg/dL (ref 0.2–1.2)
TOTAL PROTEIN: 6.4 g/dL (ref 6.1–8.1)
Total Bilirubin: 0.5 mg/dL (ref 0.2–1.2)

## 2017-04-22 LAB — LIPID PANEL
CHOL/HDL RATIO: 2.8 ratio (ref <5.0)
CHOLESTEROL: 150 mg/dL (ref <200)
HDL: 54 mg/dL (ref >50)
LDL Cholesterol: 81 mg/dL (ref <100)
Triglycerides: 73 mg/dL (ref <150)
VLDL: 15 mg/dL (ref <30)

## 2017-04-22 LAB — CBC WITH DIFFERENTIAL/PLATELET
BASOS ABS: 0 {cells}/uL (ref 0–200)
Basophils Relative: 0 %
EOS ABS: 204 {cells}/uL (ref 15–500)
EOS PCT: 3 %
HCT: 41.3 % (ref 35.0–45.0)
Hemoglobin: 13.6 g/dL (ref 11.7–15.5)
Lymphocytes Relative: 31 %
Lymphs Abs: 2108 cells/uL (ref 850–3900)
MCH: 30.3 pg (ref 27.0–33.0)
MCHC: 32.9 g/dL (ref 32.0–36.0)
MCV: 92 fL (ref 80.0–100.0)
MPV: 10.5 fL (ref 7.5–12.5)
Monocytes Absolute: 408 cells/uL (ref 200–950)
Monocytes Relative: 6 %
NEUTROS PCT: 60 %
Neutro Abs: 4080 cells/uL (ref 1500–7800)
Platelets: 186 10*3/uL (ref 140–400)
RBC: 4.49 MIL/uL (ref 3.80–5.10)
RDW: 13.8 % (ref 11.0–15.0)
WBC: 6.8 10*3/uL (ref 3.8–10.8)

## 2017-04-22 LAB — BASIC METABOLIC PANEL WITH GFR
BUN: 16 mg/dL (ref 7–25)
CALCIUM: 9.2 mg/dL (ref 8.6–10.4)
CHLORIDE: 109 mmol/L (ref 98–110)
CO2: 22 mmol/L (ref 20–31)
CREATININE: 0.82 mg/dL (ref 0.50–0.99)
GFR, Est African American: 87 mL/min (ref >=60)
GFR, Est Non African American: 75 mL/min (ref >=60)
GLUCOSE: 94 mg/dL (ref 65–99)
Potassium: 4.4 mmol/L (ref 3.5–5.3)
SODIUM: 143 mmol/L (ref 135–146)

## 2017-04-22 LAB — TSH: TSH: 1.35 m[IU]/L

## 2017-04-22 NOTE — Progress Notes (Signed)
[facility] [place] [facility] Unk Pinto, M.D.      Uvaldo Bristle. Silverio Lay, P.A.-C [facility]                [address] [place], [place] [address] Telephone [phone] Telefax [phone]  Annual Screening/Preventative Visit & Comprehensive Evaluation &  Examination     This very nice 65 y.o. MWF presents for a Screening/Preventative Visit & comprehensive evaluation and management of multiple medical co-morbidities.  Patient has been followed for HTN, Prediabetes, Hyperlipidemia and Vitamin D Deficiency. Patient is s/p L Breast Lumpectomy in 2006.       HTN predates since 2010. Patient's BP has been controlled at home and patient denies any cardiac symptoms as chest pain, palpitations, shortness of breath, dizziness or ankle swelling. Today's BP is at goal - 122/82.      Patient's hyperlipidemia is controlled with diet and medications. Patient denies myalgias or other medication SE's. Last lipids were at goal: Lab Results  Component Value Date   CHOL 147 07/07/2016   HDL 60 07/07/2016   LDLCALC 66 07/07/2016   TRIG 104 07/07/2016   CHOLHDL 2.5 07/07/2016      Patient has Morbid Obesity  (BMI 32+) and consequent prediabetes (A1c 5.8% in 2010 and 6.1% in 2011) and patient denies reactive hypoglycemic symptoms, visual blurring, diabetic polys, or paresthesias. Last A1c was not at goal: Lab Results  Component Value Date   HGBA1C 5.9 (H) 07/07/2016      Finally, patient has history of Vitamin D Deficiency ("36" in 2008)  and last Vitamin D was sl low (goal 70-100):  Lab Results  Component Value Date   VD25OH 54 03/30/2016   Current Outpatient Prescriptions on File Prior to Visit  Medication Sig  . aspirin 81 MG tablet Take 81 mg by mouth daily.  Marland Kitchen atorvastatin (LIPITOR) 80 MG tablet TAKE ONE TABLET BY MOUTH ONCE DAILY FOR CHOLESTEROL  . Calcium Carb-Cholecalciferol (CALTRATE 600+D3 SOFT) 600-800 MG-UNIT CHEW Chew  1 Units by mouth daily.  . Cholecalciferol (VITAMIN D PO) Take 2,000 Units by mouth. Takes 10000 iu daily  . Magnesium 250 MG TABS Take by mouth daily.  . Multiple Vitamin (MULTIVITAMIN) tablet Take 1 tablet by mouth daily.     Allergies  Allergen Reactions  . Nitrofurantoin Monohyd Macro Nausea Only   Past Medical History:  Diagnosis Date  . Cancer (HCC)    Breast  . 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 Hypercholesteremia   . Osteopenia 11/2015   T score -1.6 stable from prior DEXA  . Serous cyst of ovary    left   Health Maintenance  Topic Date Due  . COLONOSCOPY  06/19/2001  . PNA vac Low Risk Adult (1 of 2 - PCV13) 06/19/2016  . OPHTHALMOLOGY EXAM  08/17/2016  . HEMOGLOBIN A1C  01/07/2017  . URINE MICROALBUMIN  03/30/2017  . INFLUENZA VACCINE  07/07/2017  . MAMMOGRAM  11/04/2018  . TETANUS/TDAP  09/14/2019  . DEXA SCAN  Completed  . Hepatitis C Screening  Completed  . HIV Screening  Completed  . FOOT EXAM  Excluded  . PAP SMEAR  Excluded   Immunization History  Administered Date(s) Administered  . DTaP 06/26/1999  . Influenza Split 09/05/2014, 10/04/2015  . Influenza Whole 09/01/2013  . Influenza-Unspecified 09/09/2016  . PPD Test 03/05/2014, 03/11/2015, 03/30/2016  .  Pneumococcal Polysaccharide-23 02/04/2006  . Tdap 09/13/2009  . Zoster 10/04/2015   Past Surgical History:  Procedure Laterality Date  . BREAST LUMPECTOMY  2006  . SALPINGOOPHORECTOMY  1978   left   Family History  Problem Relation Age of Onset  . Hypertension Mother   . Colon cancer Father   . Diabetes Sister   . Heart disease Sister   . Cancer Sister        ? Type  . Breast cancer Paternal Grandmother        Age 55's   Social History  Substance Use Topics  . Smoking status: Never Smoker  . Smokeless tobacco: Never Used  . Alcohol use No    ROS Constitutional: Denies fever, chills, weight loss/gain, headaches, insomnia,  night sweats, and change in appetite.  Does c/o fatigue. Eyes: Denies redness, blurred vision, diplopia, discharge, itchy, watery eyes.  ENT: Denies discharge, congestion, post nasal drip, epistaxis, sore throat, earache, hearing loss, dental pain, Tinnitus, Vertigo, Sinus pain, snoring.  Cardio: Denies chest pain, palpitations, irregular heartbeat, syncope, dyspnea, diaphoresis, orthopnea, PND, claudication, edema Respiratory: denies cough, dyspnea, DOE, pleurisy, hoarseness, laryngitis, wheezing.  Gastrointestinal: Denies dysphagia, heartburn, reflux, water brash, pain, cramps, nausea, vomiting, bloating, diarrhea, constipation, hematemesis, melena, hematochezia, jaundice, hemorrhoids Genitourinary: Denies dysuria, frequency, urgency, nocturia, hesitancy, discharge, hematuria, flank pain Breast: Breast lumps, nipple discharge, bleeding.  Musculoskeletal: Denies arthralgia, myalgia, stiffness, Jt. Swelling, pain, limp, and strain/sprain. Denies falls. Skin: Denies puritis, rash, hives, warts, acne, eczema, changing in skin lesion Neuro: No weakness, tremor, incoordination, spasms, paresthesia, pain Psychiatric: Denies confusion, memory loss, sensory loss. Denies Depression. Endocrine: Denies change in weight, skin, hair change, nocturia, and paresthesia, diabetic polys, visual blurring, hyper / hypo glycemic episodes.  Heme/Lymph: No excessive bleeding, bruising, enlarged lymph nodes.  Physical Exam  BP 122/82   Pulse 76   Temp 97.5 F (36.4 C)   Resp 16   Ht 5\' 3"  (1.6 m)   Wt 185 lb 3.2 oz (84 kg)   BMI 32.81 kg/m   General Appearance: Well nourished, well groomed and in no apparent distress.  Eyes: PERRLA, EOMs, conjunctiva no swelling or erythema, normal fundi and vessels. Sinuses: No frontal/maxillary tenderness ENT/Mouth: EACs patent / TMs  nl. Nares clear without erythema, swelling, mucoid exudates. Oral hygiene is good. No erythema, swelling, or exudate. Tongue normal, non-obstructing. Tonsils not swollen or  erythematous. Hearing normal.  Neck: Supple, thyroid normal. No bruits, nodes or JVD. Respiratory: Respiratory effort normal.  BS equal and clear bilateral without rales, rhonci, wheezing or stridor. Cardio: Heart sounds are normal with regular rate and rhythm and no murmurs, rubs or gallops. Peripheral pulses are normal and equal bilaterally without edema. No aortic or femoral bruits. Chest: symmetric with normal excursions and percussion. Breasts: Symmetric, without lumps, nipple discharge, retractions, or fibrocystic changes.  Abdomen: Flat, soft with bowel sounds active. Nontender, no guarding, rebound, hernias, masses, or organomegaly.  Lymphatics: Non tender without lymphadenopathy.  Musculoskeletal: Full ROM all peripheral extremities, joint stability, 5/5 strength, and normal gait. Skin: Warm and dry without rashes, lesions, cyanosis, clubbing or  ecchymosis.  Neuro: Cranial nerves intact, reflexes equal bilaterally. Normal muscle tone, no cerebellar symptoms. Sensation intact.  Pysch: Alert and oriented X 3, normal affect, Insight and Judgment appropriate.   Assessment and Plan  1. Essential hypertension  - EKG 12-Lead - Urinalysis, Routine w reflex microscopic - CBC with Differential/Platelet - BASIC METABOLIC PANEL WITH GFR - Magnesium - TSH - Microalbumin / creatinine urine  ratio  2. Hyperlipidemia, mixed  - EKG 12-Lead - Hepatic function panel - Lipid panel - TSH  3. Prediabetes  - EKG 12-Lead - Hemoglobin A1c - Insulin, random  4. Vitamin D deficiency  - VITAMIN D 25 Hydroxy  5. Screening for rectal cancer  - POC Hemoccult Bld/Stl   6. Screening for ischemic heart disease  - EKG 12-Lead  7. Medication management  - Urinalysis, Routine w reflex microscopic - CBC with Differential/Platelet - BASIC METABOLIC PANEL WITH GFR - Hepatic function panel - Magnesium - Lipid panel - TSH - Hemoglobin A1c - Insulin, random - VITAMIN D 25 Hydroxy  -  Microalbumin / creatinine urine ratio  8. Need for prophylactic vaccination against Streptococcus pneumoniae (pneumococcus)  - Pneumococcal conjugate vaccine 13-valent       Patient was counseled in prudent diet to achieve/maintain BMI less than 25 for weight control, BP monitoring, regular exercise and medications. Discussed med's effects and SE's. Screening labs and tests as requested with regular follow-up as recommended. Over 40 minutes of exam, counseling, chart review and high complex critical decision making was performed.

## 2017-04-22 NOTE — Patient Instructions (Signed)
Preventive Care for Adults A healthy lifestyle and preventive care can promote health and wellness. Preventive health guidelines for women include the following key practices.  A routine yearly physical is a good way to check with your health care provider about your health and preventive screening. It is a chance to share any concerns and updates on your health and to receive a thorough exam.  Visit your dentist for a routine exam and preventive care every 6 months. Brush your teeth twice a day and floss once a day. Good oral hygiene prevents tooth decay and gum disease.  The frequency of eye exams is based on your age, health, family medical history, use of contact lenses, and other factors. Follow your health care provider's recommendations for frequency of eye exams.  Eat a healthy diet. Foods like vegetables, fruits, whole grains, low-fat dairy products, and lean protein foods contain the nutrients you need without too many calories. Decrease your intake of foods high in solid fats, added sugars, and salt. Eat the right amount of calories for you.Get information about a proper diet from your health care provider, if necessary.  Regular physical exercise is one of the most important things you can do for your health. Most adults should get at least 150 minutes of moderate-intensity exercise (any activity that increases your heart rate and causes you to sweat) each week. In addition, most adults need muscle-strengthening exercises on 2 or more days a week.  Maintain a healthy weight. The body mass index (BMI) is a screening tool to identify possible weight problems. It provides an estimate of body fat based on height and weight. Your health care provider can find your BMI and can help you achieve or maintain a healthy weight.For adults 20 years and older:  A BMI below 18.5 is considered underweight.  A BMI of 18.5 to 24.9 is normal.  A BMI of 25 to 29.9 is considered overweight.  A BMI of  30 and above is considered obese.  Maintain normal blood lipids and cholesterol levels by exercising and minimizing your intake of saturated fat. Eat a balanced diet with plenty of fruit and vegetables. Blood tests for lipids and cholesterol should begin at age 76 and be repeated every 5 years. If your lipid or cholesterol levels are high, you are over 50, or you are at high risk for heart disease, you may need your cholesterol levels checked more frequently.Ongoing high lipid and cholesterol levels should be treated with medicines if diet and exercise are not working.  If you smoke, find out from your health care provider how to quit. If you do not use tobacco, do not start.  Lung cancer screening is recommended for adults aged 22-80 years who are at high risk for developing lung cancer because of a history of smoking. A yearly low-dose CT scan of the lungs is recommended for people who have at least a 30-pack-year history of smoking and are a current smoker or have quit within the past 15 years. A pack year of smoking is smoking an average of 1 pack of cigarettes a day for 1 year (for example: 1 pack a day for 30 years or 2 packs a day for 15 years). Yearly screening should continue until the smoker has stopped smoking for at least 15 years. Yearly screening should be stopped for people who develop a health problem that would prevent them from having lung cancer treatment.  If you are pregnant, do not drink alcohol. If you are breastfeeding,  be very cautious about drinking alcohol. If you are not pregnant and choose to drink alcohol, do not have more than 1 drink per day. One drink is considered to be 12 ounces (355 mL) of beer, 5 ounces (148 mL) of wine, or 1.5 ounces (44 mL) of liquor.  Avoid use of street drugs. Do not share needles with anyone. Ask for help if you need support or instructions about stopping the use of drugs.  High blood pressure causes heart disease and increases the risk of  stroke. Your blood pressure should be checked at least every 1 to 2 years. Ongoing high blood pressure should be treated with medicines if weight loss and exercise do not work.  If you are 75-52 years old, ask your health care provider if you should take aspirin to prevent strokes.  Diabetes screening involves taking a blood sample to check your fasting blood sugar level. This should be done once every 3 years, after age 15, if you are within normal weight and without risk factors for diabetes. Testing should be considered at a younger age or be carried out more frequently if you are overweight and have at least 1 risk factor for diabetes.  Breast cancer screening is essential preventive care for women. You should practice "breast self-awareness." This means understanding the normal appearance and feel of your breasts and may include breast self-examination. Any changes detected, no matter how small, should be reported to a health care provider. Women in their 58s and 30s should have a clinical breast exam (CBE) by a health care provider as part of a regular health exam every 1 to 3 years. After age 16, women should have a CBE every year. Starting at age 53, women should consider having a mammogram (breast X-ray test) every year. Women who have a family history of breast cancer should talk to their health care provider about genetic screening. Women at a high risk of breast cancer should talk to their health care providers about having an MRI and a mammogram every year.  Breast cancer gene (BRCA)-related cancer risk assessment is recommended for women who have family members with BRCA-related cancers. BRCA-related cancers include breast, ovarian, tubal, and peritoneal cancers. Having family members with these cancers may be associated with an increased risk for harmful changes (mutations) in the breast cancer genes BRCA1 and BRCA2. Results of the assessment will determine the need for genetic counseling and  BRCA1 and BRCA2 testing.  Routine pelvic exams to screen for cancer are no longer recommended for nonpregnant women who are considered low risk for cancer of the pelvic organs (ovaries, uterus, and vagina) and who do not have symptoms. Ask your health care provider if a screening pelvic exam is right for you.  If you have had past treatment for cervical cancer or a condition that could lead to cancer, you need Pap tests and screening for cancer for at least 20 years after your treatment. If Pap tests have been discontinued, your risk factors (such as having a new sexual partner) need to be reassessed to determine if screening should be resumed. Some women have medical problems that increase the chance of getting cervical cancer. In these cases, your health care provider may recommend more frequent screening and Pap tests.  The HPV test is an additional test that may be used for cervical cancer screening. The HPV test looks for the virus that can cause the cell changes on the cervix. The cells collected during the Pap test can be  tested for HPV. The HPV test could be used to screen women aged 30 years and older, and should be used in women of any age who have unclear Pap test results. After the age of 30, women should have HPV testing at the same frequency as a Pap test.  Colorectal cancer can be detected and often prevented. Most routine colorectal cancer screening begins at the age of 50 years and continues through age 75 years. However, your health care provider may recommend screening at an earlier age if you have risk factors for colon cancer. On a yearly basis, your health care provider may provide home test kits to check for hidden blood in the stool. Use of a small camera at the end of a tube, to directly examine the colon (sigmoidoscopy or colonoscopy), can detect the earliest forms of colorectal cancer. Talk to your health care provider about this at age 50, when routine screening begins. Direct  exam of the colon should be repeated every 5-10 years through age 75 years, unless early forms of pre-cancerous polyps or small growths are found.  People who are at an increased risk for hepatitis B should be screened for this virus. You are considered at high risk for hepatitis B if:  You were born in a country where hepatitis B occurs often. Talk with your health care provider about which countries are considered high risk.  Your parents were born in a high-risk country and you have not received a shot to protect against hepatitis B (hepatitis B vaccine).  You have HIV or AIDS.  You use needles to inject street drugs.  You live with, or have sex with, someone who has hepatitis B.  You get hemodialysis treatment.  You take certain medicines for conditions like cancer, organ transplantation, and autoimmune conditions.  Hepatitis C blood testing is recommended for all people born from 1945 through 1965 and any individual with known risks for hepatitis C.  Practice safe sex. Use condoms and avoid high-risk sexual practices to reduce the spread of sexually transmitted infections (STIs). STIs include gonorrhea, chlamydia, syphilis, trichomonas, herpes, HPV, and human immunodeficiency virus (HIV). Herpes, HIV, and HPV are viral illnesses that have no cure. They can result in disability, cancer, and death.  You should be screened for sexually transmitted illnesses (STIs) including gonorrhea and chlamydia if:  You are sexually active and are younger than 24 years.  You are older than 24 years and your health care provider tells you that you are at risk for this type of infection.  Your sexual activity has changed since you were last screened and you are at an increased risk for chlamydia or gonorrhea. Ask your health care provider if you are at risk.  If you are at risk of being infected with HIV, it is recommended that you take a prescription medicine daily to prevent HIV infection. This is  called preexposure prophylaxis (PrEP). You are considered at risk if:  You are a heterosexual woman, are sexually active, and are at increased risk for HIV infection.  You take drugs by injection.  You are sexually active with a partner who has HIV.  Talk with your health care provider about whether you are at high risk of being infected with HIV. If you choose to begin PrEP, you should first be tested for HIV. You should then be tested every 3 months for as long as you are taking PrEP.  Osteoporosis is a disease in which the bones lose minerals and strength   with aging. This can result in serious bone fractures or breaks. The risk of osteoporosis can be identified using a bone density scan. Women ages 65 years and over and women at risk for fractures or osteoporosis should discuss screening with their health care providers. Ask your health care provider whether you should take a calcium supplement or vitamin D to reduce the rate of osteoporosis.  Menopause can be associated with physical symptoms and risks. Hormone replacement therapy is available to decrease symptoms and risks. You should talk to your health care provider about whether hormone replacement therapy is right for you.  Use sunscreen. Apply sunscreen liberally and repeatedly throughout the day. You should seek shade when your shadow is shorter than you. Protect yourself by wearing long sleeves, pants, a wide-brimmed hat, and sunglasses year round, whenever you are outdoors.  Once a month, do a whole body skin exam, using a mirror to look at the skin on your back. Tell your health care provider of new moles, moles that have irregular borders, moles that are larger than a pencil eraser, or moles that have changed in shape or color.  Stay current with required vaccines (immunizations).  Influenza vaccine. All adults should be immunized every year.  Tetanus, diphtheria, and acellular pertussis (Td, Tdap) vaccine. Pregnant women should  receive 1 dose of Tdap vaccine during each pregnancy. The dose should be obtained regardless of the length of time since the last dose. Immunization is preferred during the 27th-36th week of gestation. An adult who has not previously received Tdap or who does not know her vaccine status should receive 1 dose of Tdap. This initial dose should be followed by tetanus and diphtheria toxoids (Td) booster doses every 10 years. Adults with an unknown or incomplete history of completing a 3-dose immunization series with Td-containing vaccines should begin or complete a primary immunization series including a Tdap dose. Adults should receive a Td booster every 10 years.  Varicella vaccine. An adult without evidence of immunity to varicella should receive 2 doses or a second dose if she has previously received 1 dose. Pregnant females who do not have evidence of immunity should receive the first dose after pregnancy. This first dose should be obtained before leaving the health care facility. The second dose should be obtained 4-8 weeks after the first dose.  Human papillomavirus (HPV) vaccine. Females aged 13-26 years who have not received the vaccine previously should obtain the 3-dose series. The vaccine is not recommended for use in pregnant females. However, pregnancy testing is not needed before receiving a dose. If a female is found to be pregnant after receiving a dose, no treatment is needed. In that case, the remaining doses should be delayed until after the pregnancy. Immunization is recommended for any person with an immunocompromised condition through the age of 26 years if she did not get any or all doses earlier. During the 3-dose series, the second dose should be obtained 4-8 weeks after the first dose. The third dose should be obtained 24 weeks after the first dose and 16 weeks after the second dose.  Zoster vaccine. One dose is recommended for adults aged 60 years or older unless certain conditions are  present.  Measles, mumps, and rubella (MMR) vaccine. Adults born before 1957 generally are considered immune to measles and mumps. Adults born in 1957 or later should have 1 or more doses of MMR vaccine unless there is a contraindication to the vaccine or there is laboratory evidence of immunity to   each of the three diseases. A routine second dose of MMR vaccine should be obtained at least 28 days after the first dose for students attending postsecondary schools, health care workers, or international travelers. People who received inactivated measles vaccine or an unknown type of measles vaccine during 1963-1967 should receive 2 doses of MMR vaccine. People who received inactivated mumps vaccine or an unknown type of mumps vaccine before 1979 and are at high risk for mumps infection should consider immunization with 2 doses of MMR vaccine. For females of childbearing age, rubella immunity should be determined. If there is no evidence of immunity, females who are not pregnant should be vaccinated. If there is no evidence of immunity, females who are pregnant should delay immunization until after pregnancy. Unvaccinated health care workers born before 1957 who lack laboratory evidence of measles, mumps, or rubella immunity or laboratory confirmation of disease should consider measles and mumps immunization with 2 doses of MMR vaccine or rubella immunization with 1 dose of MMR vaccine.  Pneumococcal 13-valent conjugate (PCV13) vaccine. When indicated, a person who is uncertain of her immunization history and has no record of immunization should receive the PCV13 vaccine. An adult aged 19 years or older who has certain medical conditions and has not been previously immunized should receive 1 dose of PCV13 vaccine. This PCV13 should be followed with a dose of pneumococcal polysaccharide (PPSV23) vaccine. The PPSV23 vaccine dose should be obtained at least 8 weeks after the dose of PCV13 vaccine. An adult aged 19  years or older who has certain medical conditions and previously received 1 or more doses of PPSV23 vaccine should receive 1 dose of PCV13. The PCV13 vaccine dose should be obtained 1 or more years after the last PPSV23 vaccine dose.  Pneumococcal polysaccharide (PPSV23) vaccine. When PCV13 is also indicated, PCV13 should be obtained first. All adults aged 65 years and older should be immunized. An adult younger than age 65 years who has certain medical conditions should be immunized. Any person who resides in a nursing home or long-term care facility should be immunized. An adult smoker should be immunized. People with an immunocompromised condition and certain other conditions should receive both PCV13 and PPSV23 vaccines. People with human immunodeficiency virus (HIV) infection should be immunized as soon as possible after diagnosis. Immunization during chemotherapy or radiation therapy should be avoided. Routine use of PPSV23 vaccine is not recommended for American Indians, [place] Natives, or people younger than 65 years unless there are medical conditions that require PPSV23 vaccine. When indicated, people who have unknown immunization and have no record of immunization should receive PPSV23 vaccine. One-time revaccination 5 years after the first dose of PPSV23 is recommended for people aged 19-64 years who have chronic kidney failure, nephrotic syndrome, asplenia, or immunocompromised conditions. People who received 1-2 doses of PPSV23 before age 65 years should receive another dose of PPSV23 vaccine at age 65 years or later if at least 5 years have passed since the previous dose. Doses of PPSV23 are not needed for people immunized with PPSV23 at or after age 65 years.  Meningococcal vaccine. Adults with asplenia or persistent complement component deficiencies should receive 2 doses of quadrivalent meningococcal conjugate (MenACWY-D) vaccine. The doses should be obtained at least 2 months apart.  Microbiologists working with certain meningococcal bacteria, military recruits, people at risk during an outbreak, and people who travel to or live in countries with a high rate of meningitis should be immunized. A first-year college student up through age   21 years who is living in a residence hall should receive a dose if she did not receive a dose on or after her 16th birthday. Adults who have certain high-risk conditions should receive one or more doses of vaccine.  Hepatitis A vaccine. Adults who wish to be protected from this disease, have certain high-risk conditions, work with hepatitis A-infected animals, work in hepatitis A research labs, or travel to or work in countries with a high rate of hepatitis A should be immunized. Adults who were previously unvaccinated and who anticipate close contact with an international adoptee during the first 60 days after arrival in the [place] from a country with a high rate of hepatitis A should be immunized.  Hepatitis B vaccine. Adults who wish to be protected from this disease, have certain high-risk conditions, may be exposed to blood or other infectious body fluids, are household contacts or sex partners of hepatitis B positive people, are clients or workers in certain care facilities, or travel to or work in countries with a high rate of hepatitis B should be immunized.  Haemophilus influenzae type b (Hib) vaccine. A previously unvaccinated person with asplenia or sickle cell disease or having a scheduled splenectomy should receive 1 dose of Hib vaccine. Regardless of previous immunization, a recipient of a hematopoietic stem cell transplant should receive a 3-dose series 6-12 months after her successful transplant. Hib vaccine is not recommended for adults with HIV infection. Preventive Services / Frequency Ages 64 to 68 years  Blood pressure check.** / Every 1 to 2 years.  Lipid and cholesterol check.** / Every 5 years beginning at age  22.  Clinical breast exam.** / Every 3 years for women in their 88s and 53s.  BRCA-related cancer risk assessment.** / For women who have family members with a BRCA-related cancer (breast, ovarian, tubal, or peritoneal cancers).  Pap test.** / Every 2 years from ages 90 through 51. Every 3 years starting at age 21 through age 56 or 3 with a history of 3 consecutive normal Pap tests.  HPV screening.** / Every 3 years from ages 24 through ages 1 to 46 with a history of 3 consecutive normal Pap tests.  Hepatitis C blood test.** / For any individual with known risks for hepatitis C.  Skin self-exam. / Monthly.  Influenza vaccine. / Every year.  Tetanus, diphtheria, and acellular pertussis (Tdap, Td) vaccine.** / Consult your health care provider. Pregnant women should receive 1 dose of Tdap vaccine during each pregnancy. 1 dose of Td every 10 years.  Varicella vaccine.** / Consult your health care provider. Pregnant females who do not have evidence of immunity should receive the first dose after pregnancy.  HPV vaccine. / 3 doses over 6 months, if 72 and younger. The vaccine is not recommended for use in pregnant females. However, pregnancy testing is not needed before receiving a dose.  Measles, mumps, rubella (MMR) vaccine.** / You need at least 1 dose of MMR if you were born in 1957 or later. You may also need a 2nd dose. For females of childbearing age, rubella immunity should be determined. If there is no evidence of immunity, females who are not pregnant should be vaccinated. If there is no evidence of immunity, females who are pregnant should delay immunization until after pregnancy.  Pneumococcal 13-valent conjugate (PCV13) vaccine.** / Consult your health care provider.  Pneumococcal polysaccharide (PPSV23) vaccine.** / 1 to 2 doses if you smoke cigarettes or if you have certain conditions.  Meningococcal vaccine.** /  1 dose if you are age 19 to 21 years and a first-year college  student living in a residence hall, or have one of several medical conditions, you need to get vaccinated against meningococcal disease. You may also need additional booster doses.  Hepatitis A vaccine.** / Consult your health care provider.  Hepatitis B vaccine.** / Consult your health care provider.  Haemophilus influenzae type b (Hib) vaccine.** / Consult your health care provider. Ages 40 to 64 years  Blood pressure check.** / Every 1 to 2 years.  Lipid and cholesterol check.** / Every 5 years beginning at age 20 years.  Lung cancer screening. / Every year if you are aged 55-80 years and have a 30-pack-year history of smoking and currently smoke or have quit within the past 15 years. Yearly screening is stopped once you have quit smoking for at least 15 years or develop a health problem that would prevent you from having lung cancer treatment.  Clinical breast exam.** / Every year after age 40 years.  BRCA-related cancer risk assessment.** / For women who have family members with a BRCA-related cancer (breast, ovarian, tubal, or peritoneal cancers).  Mammogram.** / Every year beginning at age 40 years and continuing for as long as you are in good health. Consult with your health care provider.  Pap test.** / Every 3 years starting at age 30 years through age 65 or 70 years with a history of 3 consecutive normal Pap tests.  HPV screening.** / Every 3 years from ages 30 years through ages 65 to 70 years with a history of 3 consecutive normal Pap tests.  Fecal occult blood test (FOBT) of stool. / Every year beginning at age 50 years and continuing until age 75 years. You may not need to do this test if you get a colonoscopy every 10 years.  Flexible sigmoidoscopy or colonoscopy.** / Every 5 years for a flexible sigmoidoscopy or every 10 years for a colonoscopy beginning at age 50 years and continuing until age 75 years.  Hepatitis C blood test.** / For all people born from 1945 through  1965 and any individual with known risks for hepatitis C.  Skin self-exam. / Monthly.  Influenza vaccine. / Every year.  Tetanus, diphtheria, and acellular pertussis (Tdap/Td) vaccine.** / Consult your health care provider. Pregnant women should receive 1 dose of Tdap vaccine during each pregnancy. 1 dose of Td every 10 years.  Varicella vaccine.** / Consult your health care provider. Pregnant females who do not have evidence of immunity should receive the first dose after pregnancy.  Zoster vaccine.** / 1 dose for adults aged 60 years or older.  Measles, mumps, rubella (MMR) vaccine.** / You need at least 1 dose of MMR if you were born in 1957 or later. You may also need a 2nd dose. For females of childbearing age, rubella immunity should be determined. If there is no evidence of immunity, females who are not pregnant should be vaccinated. If there is no evidence of immunity, females who are pregnant should delay immunization until after pregnancy.  Pneumococcal 13-valent conjugate (PCV13) vaccine.** / Consult your health care provider.  Pneumococcal polysaccharide (PPSV23) vaccine.** / 1 to 2 doses if you smoke cigarettes or if you have certain conditions.  Meningococcal vaccine.** / Consult your health care provider.  Hepatitis A vaccine.** / Consult your health care provider.  Hepatitis B vaccine.** / Consult your health care provider.  Haemophilus influenzae type b (Hib) vaccine.** / Consult your health care provider. Ages 65   years and over  Blood pressure check.** / Every 1 to 2 years.  Lipid and cholesterol check.** / Every 5 years beginning at age 20 years.  Lung cancer screening. / Every year if you are aged 55-80 years and have a 30-pack-year history of smoking and currently smoke or have quit within the past 15 years. Yearly screening is stopped once you have quit smoking for at least 15 years or develop a health problem that would prevent you from having lung cancer  treatment.  Clinical breast exam.** / Every year after age 40 years.  BRCA-related cancer risk assessment.** / For women who have family members with a BRCA-related cancer (breast, ovarian, tubal, or peritoneal cancers).  Mammogram.** / Every year beginning at age 40 years and continuing for as long as you are in good health. Consult with your health care provider.  Pap test.** / Every 3 years starting at age 30 years through age 65 or 70 years with 3 consecutive normal Pap tests. Testing can be stopped between 65 and 70 years with 3 consecutive normal Pap tests and no abnormal Pap or HPV tests in the past 10 years.  HPV screening.** / Every 3 years from ages 30 years through ages 65 or 70 years with a history of 3 consecutive normal Pap tests. Testing can be stopped between 65 and 70 years with 3 consecutive normal Pap tests and no abnormal Pap or HPV tests in the past 10 years.  Fecal occult blood test (FOBT) of stool. / Every year beginning at age 50 years and continuing until age 75 years. You may not need to do this test if you get a colonoscopy every 10 years.  Flexible sigmoidoscopy or colonoscopy.** / Every 5 years for a flexible sigmoidoscopy or every 10 years for a colonoscopy beginning at age 50 years and continuing until age 75 years.  Hepatitis C blood test.** / For all people born from 1945 through 1965 and any individual with known risks for hepatitis C.  Osteoporosis screening.** / A one-time screening for women ages 65 years and over and women at risk for fractures or osteoporosis.  Skin self-exam. / Monthly.  Influenza vaccine. / Every year.  Tetanus, diphtheria, and acellular pertussis (Tdap/Td) vaccine.** / 1 dose of Td every 10 years.  Varicella vaccine.** / Consult your health care provider.  Zoster vaccine.** / 1 dose for adults aged 60 years or older.  Pneumococcal 13-valent conjugate (PCV13) vaccine.** / Consult your health care provider.  Pneumococcal  polysaccharide (PPSV23) vaccine.** / 1 dose for all adults aged 65 years and older.  Meningococcal vaccine.** / Consult your health care provider.  Hepatitis A vaccine.** / Consult your health care provider.  Hepatitis B vaccine.** / Consult your health care provider.  Haemophilus influenzae type b (Hib) vaccine.** / Consult your health care provider.   Health Maintenance Adopting a healthy lifestyle and getting preventive care can go a long way to promote health and wellness. Talk with your health care provider about what schedule of regular examinations is right for you. This is a good chance for you to check in with your provider about disease prevention and staying healthy. In between checkups, there are plenty of things you can do on your own. Experts have done a lot of research about which lifestyle changes and preventive measures are most likely to keep you healthy. Ask your health care provider for more information. WEIGHT AND DIET  Eat a healthy diet Be sure to include plenty of vegetables,   fruits, low-fat dairy products, and lean protein. Do not eat a lot of foods high in solid fats, added sugars, or salt. Get regular exercise. This is one of the most important things you can do for your health. Most adults should exercise for at least 150 minutes each week. The exercise should increase your heart rate and make you sweat (moderate-intensity exercise). Most adults should also do strengthening exercises at least twice a week. This is in addition to the moderate-intensity exercise.  Maintain a healthy weight Body mass index (BMI) is a measurement that can be used to identify possible weight problems. It estimates body fat based on height and weight. Your health care provider can help determine your BMI and help you achieve or maintain a healthy weight. For females 20 years of age and older:  A BMI below 18.5 is considered underweight. A BMI of 18.5 to 24.9 is normal. A BMI of 25 to  29.9 is considered overweight. A BMI of 30 and above is considered obese.  Watch levels of cholesterol and blood lipids You should start having your blood tested for lipids and cholesterol at 66 years of age, then have this test every 5 years. You may need to have your cholesterol levels checked more often if: Your lipid or cholesterol levels are high. You are older than 66 years of age. You are at high risk for heart disease.  CANCER SCREENING   Lung Cancer Lung cancer screening is recommended for adults 55-80 years old who are at high risk for lung cancer because of a history of smoking. A yearly low-dose CT scan of the lungs is recommended for people who: Currently smoke. Have quit within the past 15 years. Have at least a 30-pack-year history of smoking. A pack year is smoking an average of one pack of cigarettes a day for 1 year. Yearly screening should continue until it has been 15 years since you quit. Yearly screening should stop if you develop a health problem that would prevent you from having lung cancer treatment.  Breast Cancer Practice breast self-awareness. This means understanding how your breasts normally appear and feel. It also means doing regular breast self-exams. Let your health care provider know about any changes, no matter how small. If you are in your 20s or 30s, you should have a clinical breast exam (CBE) by a health care provider every 1-3 years as part of a regular health exam. If you are 40 or older, have a CBE every year. Also consider having a breast X-ray (mammogram) every year. If you have a family history of breast cancer, talk to your health care provider about genetic screening. If you are at high risk for breast cancer, talk to your health care provider about having an MRI and a mammogram every year. Breast cancer gene (BRCA) assessment is recommended for women who have family members with BRCA-related cancers. BRCA-related cancers  include: Breast. Ovarian. Tubal. Peritoneal cancers. Results of the assessment will determine the need for genetic counseling and BRCA1 and BRCA2 testing. Cervical Cancer Routine pelvic examinations to screen for cervical cancer are no longer recommended for nonpregnant women who are considered low risk for cancer of the pelvic organs (ovaries, uterus, and vagina) and who do not have symptoms. A pelvic examination may be necessary if you have symptoms including those associated with pelvic infections. Ask your health care provider if a screening pelvic exam is right for you.  The Pap test is the screening test for cervical cancer for   women who are considered at risk. If you had a hysterectomy for a problem that was not cancer or a condition that could lead to cancer, then you no longer need Pap tests. If you are older than 65 years, and you have had normal Pap tests for the past 10 years, you no longer need to have Pap tests. If you have had past treatment for cervical cancer or a condition that could lead to cancer, you need Pap tests and screening for cancer for at least 20 years after your treatment. If you no longer get a Pap test, assess your risk factors if they change (such as having a new sexual partner). This can affect whether you should start being screened again. Some women have medical problems that increase their chance of getting cervical cancer. If this is the case for you, your health care provider may recommend more frequent screening and Pap tests. The human papillomavirus (HPV) test is another test that may be used for cervical cancer screening. The HPV test looks for the virus that can cause cell changes in the cervix. The cells collected during the Pap test can be tested for HPV. The HPV test can be used to screen women 72 years of age and older. Getting tested for HPV can extend the interval between normal Pap tests from three to five years. An HPV test also should be used to  screen women of any age who have unclear Pap test results. After 66 years of age, women should have HPV testing as often as Pap tests.  Colorectal Cancer This type of cancer can be detected and often prevented. Routine colorectal cancer screening usually begins at 66 years of age and continues through 66 years of age. Your health care provider may recommend screening at an earlier age if you have risk factors for colon cancer. Your health care provider may also recommend using home test kits to check for hidden blood in the stool. A small camera at the end of a tube can be used to examine your colon directly (sigmoidoscopy or colonoscopy). This is done to check for the earliest forms of colorectal cancer. Routine screening usually begins at age 58. Direct examination of the colon should be repeated every 5-10 years through 66 years of age. However, you may need to be screened more often if early forms of precancerous polyps or small growths are found. Skin Cancer Check your skin from head to toe regularly. Tell your health care provider about any new moles or changes in moles, especially if there is a change in a mole's shape or color. Also tell your health care provider if you have a mole that is larger than the size of a pencil eraser. Always use sunscreen. Apply sunscreen liberally and repeatedly throughout the day. Protect yourself by wearing long sleeves, pants, a wide-brimmed hat, and sunglasses whenever you are outside. HEART DISEASE, DIABETES, AND HIGH BLOOD PRESSURE  Have your blood pressure checked at least every 1-2 years. High blood pressure causes heart disease and increases the risk of stroke. If you are between 61 years and 77 years old, ask your health care provider if you should take aspirin to prevent strokes. Have regular diabetes screenings. This involves taking a blood sample to check your fasting blood sugar level. If you are at a normal weight and have a low risk for  diabetes, have this test once every three years after 66 years of age. If you are overweight and have a high risk  for diabetes, consider being tested at a younger age or more often. PREVENTING INFECTION  Hepatitis B If you have a higher risk for hepatitis B, you should be screened for this virus. You are considered at high risk for hepatitis B if: You were born in a country where hepatitis B is common. Ask your health care provider which countries are considered high risk. Your parents were born in a high-risk country, and you have not been immunized against hepatitis B (hepatitis B vaccine). You have HIV or AIDS. You use needles to inject street drugs. You live with someone who has hepatitis B. You have had sex with someone who has hepatitis B. You get hemodialysis treatment. You take certain medicines for conditions, including cancer, organ transplantation, and autoimmune conditions. Hepatitis C Blood testing is recommended for: Everyone born from 1945 through 1965. Anyone with known risk factors for hepatitis C. Sexually transmitted infections (STIs) You should be screened for sexually transmitted infections (STIs) including gonorrhea and chlamydia if: You are sexually active and are younger than 66 years of age. You are older than 66 years of age and your health care provider tells you that you are at risk for this type of infection. Your sexual activity has changed since you were last screened and you are at an increased risk for chlamydia or gonorrhea. Ask your health care provider if you are at risk. If you do not have HIV, but are at risk, it may be recommended that you take a prescription medicine daily to prevent HIV infection. This is called pre-exposure prophylaxis (PrEP). You are considered at risk if: You are sexually active and do not regularly use condoms or know the HIV status of your partner(s). You take drugs by injection. You are sexually active with a partner who has  HIV. Talk with your health care provider about whether you are at high risk of being infected with HIV. If you choose to begin PrEP, you should first be tested for HIV. You should then be tested every 3 months for as long as you are taking PrEP.  PREGNANCY  If you are premenopausal and you may become pregnant, ask your health care provider about preconception counseling. If you may become pregnant, take 400 to 800 micrograms (mcg) of folic acid every day. If you want to prevent pregnancy, talk to your health care provider about birth control (contraception). OSTEOPOROSIS AND MENOPAUSE  Osteoporosis is a disease in which the bones lose minerals and strength with aging. This can result in serious bone fractures. Your risk for osteoporosis can be identified using a bone density scan. If you are 65 years of age or older, or if you are at risk for osteoporosis and fractures, ask your health care provider if you should be screened. Ask your health care provider whether you should take a calcium or vitamin D supplement to lower your risk for osteoporosis. Menopause may have certain physical symptoms and risks. Hormone replacement therapy may reduce some of these symptoms and risks. Talk to your health care provider about whether hormone replacement therapy is right for you.  HOME CARE INSTRUCTIONS  Schedule regular health, dental, and eye exams. Stay current with your immunizations.  Do not use any tobacco products including cigarettes, chewing tobacco, or electronic cigarettes. If you are pregnant, do not drink alcohol. If you are breastfeeding, limit how much and how often you drink alcohol. Limit alcohol intake to no more than 1 drink per day for nonpregnant women. One drink equals 12   ounces of beer, 5 ounces of wine, or 1 ounces of hard liquor. Do not use street drugs. Do not share needles. Ask your health care provider for help if you need support or information about quitting drugs. Tell your  health care provider if you often feel depressed. Tell your health care provider if you have ever been abused or do not feel safe at home

## 2017-04-23 LAB — MAGNESIUM: Magnesium: 2 mg/dL (ref 1.5–2.5)

## 2017-04-23 LAB — URINALYSIS, ROUTINE W REFLEX MICROSCOPIC
BILIRUBIN URINE: NEGATIVE
Glucose, UA: NEGATIVE
HGB URINE DIPSTICK: NEGATIVE
Ketones, ur: NEGATIVE
Leukocytes, UA: NEGATIVE
Nitrite: NEGATIVE
PH: 7 (ref 5.0–8.0)
PROTEIN: NEGATIVE
Specific Gravity, Urine: 1.017 (ref 1.001–1.035)

## 2017-04-23 LAB — MICROALBUMIN / CREATININE URINE RATIO
CREATININE, URINE: 115 mg/dL (ref 20–320)
Microalb Creat Ratio: 4 mcg/mg creat (ref <30)
Microalb, Ur: 0.5 mg/dL

## 2017-04-23 LAB — VITAMIN D 25 HYDROXY (VIT D DEFICIENCY, FRACTURES): VIT D 25 HYDROXY: 49 ng/mL (ref 30–100)

## 2017-04-23 LAB — HEMOGLOBIN A1C
HEMOGLOBIN A1C: 5.9 % — AB (ref <5.7)
MEAN PLASMA GLUCOSE: 123 mg/dL

## 2017-04-23 LAB — INSULIN, RANDOM: Insulin: 9.7 u[IU]/mL (ref 2.0–19.6)

## 2017-05-05 NOTE — Progress Notes (Signed)
WELCOME TO MEDICARE ANNUAL WELLNESS VISIT AND 3 MONTH  Assessment:    Essential hypertension - continue medications, DASH diet, exercise and monitor at home. Call if greater than 130/80.   Ductal carcinoma in situ (DCIS) of breast, unspecified laterality Continue to monitor  Hyperlipidemia -continue medications, check lipids, decrease fatty foods, increase activity.   Prediabetes Discussed general issues about diabetes pathophysiology and management., Educational material distributed., Suggested low cholesterol diet., Encouraged aerobic exercise., Discussed foot care., Reminded to get yearly retinal exam.  Vitamin D deficiency Continue supplement  Medication management  Morbid obesity (BMI 32.67) - long discussion about weight loss, diet, and exercise  BMI 32.74, adult - long discussion about weight loss, diet, and exercise  Encounter for Medicare annual wellness exam  Over 40 minutes of exam, counseling, chart review and critical decision making was performed Future Appointments Date Time Provider [place]  08/10/2017 10:30 AM Vicie Mutters, PA-C [facility] None  11/15/2017 9:30 AM Unk Pinto, MD [facility] None  05/10/2018 9:00 AM Unk Pinto, MD [facility] None     Plan:   During the course of the visit the patient was educated and counseled about appropriate screening and preventive services including:    Pneumococcal vaccine   Prevnar 13  Influenza vaccine  Td vaccine  Screening electrocardiogram  Bone densitometry screening  Colorectal cancer screening  Diabetes screening  Glaucoma screening  Nutrition counseling   Advanced directives: requested   Subjective:  Shawna [name] is a 66 y.o. female who presents for Medicare Annual Wellness Visit and OV.     Her blood pressure has been controlled at home, today their BP is BP: 132/84  She does not workout but has part time job and takes care of her husband. She denies chest  pain, shortness of breath, dizziness. Patient is s/p lumpectomy of L breast DCIS in 2006.   She is on cholesterol medication and denies myalgias. Her cholesterol is at goal. The cholesterol last visit was:   Lab Results  Component Value Date   CHOL 150 04/22/2017   HDL 54 04/22/2017   LDLCALC 81 04/22/2017   TRIG 73 04/22/2017   CHOLHDL 2.8 04/22/2017    She has been working on diet and exercise for prediabetes, and denies paresthesia of the feet, polydipsia, polyuria and visual disturbances. Last A1C in the office was:  Lab Results  Component Value Date   HGBA1C 5.9 (H) 04/22/2017   Last GFR: Lab Results  Component Value Date   GFRNONAA 75 04/22/2017   Patient is on Vitamin D supplement.   Lab Results  Component Value Date   VD25OH 49 04/22/2017     BMI is Body mass index is 32.52 kg/m., she is working on diet and exercise. Wt Readings from Last 3 Encounters:  05/06/17 183 lb 9.6 oz (83.3 kg)  04/22/17 185 lb 3.2 oz (84 kg)  02/11/17 185 lb (83.9 kg)    Medication Review: Current Outpatient Prescriptions on File Prior to Visit  Medication Sig Dispense Refill  . aspirin 81 MG tablet Take 81 mg by mouth daily.    Marland Kitchen atorvastatin (LIPITOR) 80 MG tablet TAKE ONE TABLET BY MOUTH ONCE DAILY FOR CHOLESTEROL 90 tablet 2  . Calcium Carb-Cholecalciferol (CALTRATE 600+D3 SOFT) 600-800 MG-UNIT CHEW Chew 1 Units by mouth daily.    . Cholecalciferol (VITAMIN D PO) Take 2,000 Units by mouth. Takes 10000 iu daily    . Magnesium 250 MG TABS Take by mouth daily.    . Multiple Vitamin (MULTIVITAMIN) tablet  Take 1 tablet by mouth daily.       No current facility-administered medications on file prior to visit.     Allergies  Allergen Reactions  . Nitrofurantoin Monohyd Macro Nausea Only    Current Problems (verified) Patient Active Problem List   Diagnosis Date Noted  . BMI 32.74, adult 10/05/2015  . Medication management 09/05/2014  . Morbid obesity (BMI 32.67) 09/05/2014  .  Essential hypertension 11/24/2013  . Hyperlipidemia 11/24/2013  . Prediabetes 11/24/2013  . Vitamin D deficiency 11/24/2013  . DCIS (ductal carcinoma in situ) of breast, Left lumpectomy. 09/23/2005.   Marland Kitchen Serous cyst of ovary     Screening Tests Immunization History  Administered Date(s) Administered  . DTaP 06/26/1999  . Influenza Split 09/05/2014, 10/04/2015  . Influenza Whole 09/01/2013  . Influenza-Unspecified 09/09/2016  . PPD Test 03/05/2014, 03/11/2015, 03/30/2016  . Pneumococcal Conjugate-13 04/22/2017  . Pneumococcal Polysaccharide-23 02/04/2006  . Tdap 09/13/2009  . Zoster 10/04/2015   Preventative care: Last colonoscopy: 2012 due q 5 years, dad with colon cancer, Dr. Oletta Lamas Last mammogram: 10/2016 Last pap smear/pelvic exam: 02/2017 follows Dr. Phineas Real DEXA:11/2015 Xray Cervical spine 2014  Prior vaccinations: TD or Tdap: 2010  Influenza: 2017  Pneumococcal: 2007 Prevnar13: 2018 Shingles/Zostavax: 2016  Names of Other Physician/Practitioners you currently use: 1. [facility] here for primary care 2. Dr. Delman Cheadle, eye doctor, last visit May 2017 3. Dr. Clarisa Fling, dentist, last visit q 6 months Patient Care Team: Unk Pinto, MD as PCP - General (Internal Medicine)  SURGICAL HISTORY She  has a past surgical history that includes Breast lumpectomy (2006) and Salpingoophorectomy (1978). FAMILY HISTORY Her family history includes Breast cancer in her paternal grandmother; Cancer in her sister; Colon cancer in her father; Diabetes in her sister; Heart disease in her sister; Hypertension in her mother. SOCIAL HISTORY She  reports that she has never smoked. She has never used smokeless tobacco. She reports that she does not drink alcohol or use drugs.   MEDICARE WELLNESS OBJECTIVES: Physical activity: Current Exercise Habits: The patient does not participate in regular exercise at present Cardiac risk factors: Cardiac Risk  Factors include: advanced age (>35men, >65 women);hypertension;obesity (BMI >30kg/m2);sedentary lifestyle;dyslipidemia;family history of premature cardiovascular disease Depression/mood screen:   Depression screen [facility] 2/9 05/06/2017  Decreased Interest 0  Down, Depressed, Hopeless 0  PHQ - 2 Score 0    ADLs:  In your present state of health, do you have any difficulty performing the following activities: 05/06/2017 04/22/2017  Hearing? N N  Vision? N N  Difficulty concentrating or making decisions? N N  Walking or climbing stairs? N N  Dressing or bathing? N N  Doing errands, shopping? N N  Some recent data might be hidden     Cognitive Testing  Alert? Yes  Normal Appearance?Yes  Oriented to person? Yes  Place? Yes   Time? Yes  Recall of three objects?  Yes  Can perform simple calculations? Yes  Displays appropriate judgment?Yes  Can read the correct time from a watch face?Yes  EOL planning: Does Patient Have a Medical Advance Directive?: No Would patient like information on creating a medical advance directive?: Yes (MAU/Ambulatory/Procedural Areas - Information given)  Review of Systems  Constitutional: Negative.   HENT: Negative.   Eyes: Negative.   Respiratory: Negative.   Cardiovascular: Negative.   Gastrointestinal: Negative.   Genitourinary: Negative.   Musculoskeletal: Negative.   Skin: Negative.   Neurological: Negative.   Endo/Heme/Allergies: Negative.   Psychiatric/Behavioral: Negative.  Objective:     Today's Vitals   05/06/17 1111  BP: 132/84  Pulse: 77  Resp: 16  Temp: 97 F (36.1 C)  SpO2: 95%  Weight: 183 lb 9.6 oz (83.3 kg)  Height: 5\' 3"  (1.6 m)  PainSc: 0-No pain   Body mass index is 32.52 kg/m.  General appearance: alert, no distress, WD/WN, female HEENT: normocephalic, sclerae anicteric, TMs pearly, nares patent, no discharge or erythema, pharynx normal Oral cavity: MMM, no lesions Neck: supple, no lymphadenopathy, no  thyromegaly, no masses Heart: RRR, normal S1, S2, no murmurs Lungs: CTA bilaterally, no wheezes, rhonchi, or rales Abdomen: +bs, soft, non tender, non distended, no masses, no hepatomegaly, no splenomegaly Musculoskeletal: nontender, no swelling, no obvious deformity Extremities: no edema, no cyanosis, no clubbing Pulses: 2+ symmetric, upper and lower extremities, normal cap refill Neurological: alert, oriented x 3, CN2-12 intact, strength normal upper extremities and lower extremities, sensation normal throughout, DTRs 2+ throughout, no cerebellar signs, gait normal Psychiatric: normal affect, behavior normal, pleasant   Medicare Attestation I have personally reviewed: The patient's medical and social history Their use of alcohol, tobacco or illicit drugs Their current medications and supplements The patient's functional ability including ADLs,fall risks, home safety risks, cognitive, and hearing and visual impairment Diet and physical activities Evidence for depression or mood disorders  The patient's weight, height, BMI, and visual acuity have been recorded in the chart.  I have made referrals, counseling, and provided education to the patient based on review of the above and I have provided the patient with a written personalized care plan for preventive services.     Vicie Mutters, PA-C   05/06/2017

## 2017-05-06 ENCOUNTER — Ambulatory Visit (INDEPENDENT_AMBULATORY_CARE_PROVIDER_SITE_OTHER): Payer: Medicare Other | Admitting: Physician Assistant

## 2017-05-06 ENCOUNTER — Encounter: Payer: Self-pay | Admitting: Physician Assistant

## 2017-05-06 VITALS — BP 132/84 | HR 77 | Temp 97.0°F | Resp 16 | Ht 63.0 in | Wt 183.6 lb

## 2017-05-06 DIAGNOSIS — Z79899 Other long term (current) drug therapy: Secondary | ICD-10-CM | POA: Diagnosis not present

## 2017-05-06 DIAGNOSIS — Z0000 Encounter for general adult medical examination without abnormal findings: Secondary | ICD-10-CM

## 2017-05-06 DIAGNOSIS — E782 Mixed hyperlipidemia: Secondary | ICD-10-CM | POA: Diagnosis not present

## 2017-05-06 DIAGNOSIS — Z6832 Body mass index (BMI) 32.0-32.9, adult: Secondary | ICD-10-CM

## 2017-05-06 DIAGNOSIS — Z0001 Encounter for general adult medical examination with abnormal findings: Secondary | ICD-10-CM

## 2017-05-06 DIAGNOSIS — D0510 Intraductal carcinoma in situ of unspecified breast: Secondary | ICD-10-CM

## 2017-05-06 DIAGNOSIS — E559 Vitamin D deficiency, unspecified: Secondary | ICD-10-CM | POA: Diagnosis not present

## 2017-05-06 DIAGNOSIS — R6889 Other general symptoms and signs: Secondary | ICD-10-CM

## 2017-05-06 DIAGNOSIS — R7303 Prediabetes: Secondary | ICD-10-CM

## 2017-05-06 DIAGNOSIS — I10 Essential (primary) hypertension: Secondary | ICD-10-CM | POA: Diagnosis not present

## 2017-05-06 NOTE — Patient Instructions (Signed)
Simple math prevails.    1st - exercise does not produce significant weight loss - at best one converts fat into muscle , "bulks up", loses inches, but usually stays "weight neutral"     2nd - think of your body weightas a check book: If you eat more calories than you burn up - you save money or gain weight .... Or if you spend more money than you put in the check book, ie burn up more calories than you eat, then you lose weight     3rd - if you walk or run 1 mile, you burn up 100 calories - you have to burn up 3,500 calories to lose 1 pound, ie you have to walk/run 35 miles to lose 1 measly pound. So if you want to lose 10 #, then you have to walk/run 350 miles, so.... clearly exercise is not the solution.     4. So if you consume 1,500 calories, then you have to burn up the equivalent of 15 miles to stay weight neutral - It also stands to reason that if you consume 1,500 cal/day and don't lose weight, then you must be burning up about 1,500 cals/day to stay weight neutral.     5. If you really want to lose weight, you must cut your calorie intake 300 calories /day and at that rate you should lose about 1 # every 3 days.   6. Please purchase Dr Fara Olden Fuhrman's book(s) "The End of Dieting" & "Eat to Live" . It has some great concepts and recipes.     Increase your water to 80-100 oz a day Make sure you are eating at least 3 meals a day No bananas for breakfast, no dried fruit Try to do a protein for breakfast and not the cereal   We want weight loss that will last so you should lose 1-2 pounds a week.  THAT IS IT! Please pick THREE things a month to change. Once it is a habit check off the item. Then pick another three items off the list to become habits.  If you are already doing a habit on the list GREAT!  Cross that item off! o Don't drink your calories. Ie, alcohol, soda, fruit juice, and sweet tea.  o Drink more water. Drink a glass when you feel hungry or before each meal.  o Eat  breakfast - Complex carb and protein (likeDannon light and fit yogurt, oatmeal, fruit, eggs, [place] bacon). o Measure your cereal.  Eat no more than one cup a day. (ie [place]) o Eat an apple a day. o Add a vegetable a day. o Try a new vegetable a month. o Use Pam! Stop using oil or butter to cook. o Don't finish your plate or use smaller plates. o Share your dessert. o Eat sugar free Jello for dessert or frozen grapes. o Don't eat 2-3 hours before bed. o Switch to whole wheat bread, pasta, and brown rice. o Make healthier choices when you eat out. No fries! o Pick baked chicken, NOT fried. o Don't forget to SLOW DOWN when you eat. It is not going anywhere.  o Take the stairs. o Park far away in the parking lot o [employer] (or weights) for 10 minutes while watching TV. o Walk at work for 10 minutes during break. o Walk outside 1 time a week with your friend, kids, dog, or significant other. o Start a walking group at [place] the mall as much as  you can tolerate.  o Keep a food diary. o Weigh yourself daily. o Walk for 15 minutes 3 days per week. o Cook at home more often and eat out less.  If life happens and you go back to old habits, it is okay.  Just start over. You can do it!   If you experience chest pain, get short of breath, or tired during the exercise, please stop immediately and inform your doctor.      Bad carbs also include fruit juice, alcohol, and sweet tea. These are empty calories that do not signal to your brain that you are full.   Please remember the good carbs are still carbs which convert into sugar. So please measure them out no more than 1/2-1 cup of rice, oatmeal, pasta, and beans  Veggies are however free foods! Pile them on.   Not all fruit is created equal. Please see the list below, the fruit at the bottom is higher in sugars than the fruit at the top. Please avoid all dried fruits.

## 2017-07-20 ENCOUNTER — Other Ambulatory Visit: Payer: Self-pay | Admitting: Internal Medicine

## 2017-08-10 ENCOUNTER — Ambulatory Visit: Payer: Self-pay | Admitting: Physician Assistant

## 2017-08-12 ENCOUNTER — Encounter: Payer: Self-pay | Admitting: Physician Assistant

## 2017-08-12 ENCOUNTER — Ambulatory Visit (INDEPENDENT_AMBULATORY_CARE_PROVIDER_SITE_OTHER): Payer: Medicare Other | Admitting: Physician Assistant

## 2017-08-12 VITALS — BP 126/80 | HR 78 | Temp 97.7°F | Resp 14 | Ht 63.0 in | Wt 186.4 lb

## 2017-08-12 DIAGNOSIS — Z79899 Other long term (current) drug therapy: Secondary | ICD-10-CM | POA: Diagnosis not present

## 2017-08-12 DIAGNOSIS — R7303 Prediabetes: Secondary | ICD-10-CM | POA: Diagnosis not present

## 2017-08-12 DIAGNOSIS — I10 Essential (primary) hypertension: Secondary | ICD-10-CM

## 2017-08-12 DIAGNOSIS — Z23 Encounter for immunization: Secondary | ICD-10-CM

## 2017-08-12 DIAGNOSIS — E782 Mixed hyperlipidemia: Secondary | ICD-10-CM

## 2017-08-12 NOTE — Progress Notes (Signed)
Assessment and Plan:   Needs flu shot -     Flu vaccine HIGH DOSE PF  Essential hypertension - continue medications, DASH diet, exercise and monitor at home. Call if greater than 130/80.  -     CBC with Differential/Platelet -     BASIC METABOLIC PANEL WITH GFR -     Hepatic function panel -     TSH  Hyperlipidemia -continue medications, check lipids, decrease fatty foods, increase activity.  -     Lipid panel  Prediabetes Discussed general issues about diabetes pathophysiology and management., Educational material distributed., Suggested low cholesterol diet., Encouraged aerobic exercise., Discussed foot care., Reminded to get yearly retinal exam. -     Hemoglobin A1c  Medication management   Continue diet and meds as discussed. Further disposition pending results of labs. Future Appointments Date Time Provider [place]  11/15/2017 9:30 AM Unk Pinto, MD [facility] None  05/10/2018 9:00 AM Unk Pinto, MD [facility] None     HPI 66 y.o. female  presents for 3 month follow up with hypertension, hyperlipidemia, prediabetes and vitamin D. Her blood pressure has been controlled at home, today their BP is BP: 126/80 She does workout. She denies chest pain, shortness of breath, dizziness.  She is on cholesterol medication, lipitor 80mg  1/2 pill 4 days a week and denies myalgias. Her cholesterol is at goal. The cholesterol last visit was:   Lab Results  Component Value Date   CHOL 150 04/22/2017   HDL 54 04/22/2017   LDLCALC 81 04/22/2017   TRIG 73 04/22/2017   CHOLHDL 2.8 04/22/2017   She has been working on diet and exercise for prediabetes, and denies paresthesia of the feet, polydipsia, polyuria and visual disturbances. On bASA. Last A1C in the office was:  Lab Results  Component Value Date   HGBA1C 5.9 (H) 04/22/2017  Patient is on Vitamin D supplement, 8000 IU daily.   Lab Results  Component Value Date   VD25OH 49 04/22/2017  BMI is Body mass  index is 33.02 kg/m., she is working on diet and exercise. Wt Readings from Last 3 Encounters:  08/12/17 186 lb 6.4 oz (84.6 kg)  05/06/17 183 lb 9.6 oz (83.3 kg)  04/22/17 185 lb 3.2 oz (84 kg)   Current Medications:  Current Outpatient Prescriptions on File Prior to Visit  Medication Sig Dispense Refill  . aspirin 81 MG tablet Take 81 mg by mouth daily.    Marland Kitchen atorvastatin (LIPITOR) 80 MG tablet TAKE ONE TABLET EACH DAY FOR CHOLESTEROL 90 tablet 1  . Calcium Carb-Cholecalciferol (CALTRATE 600+D3 SOFT) 600-800 MG-UNIT CHEW Chew 1 Units by mouth daily.    . Cholecalciferol (VITAMIN D PO) Take 2,000 Units by mouth. Takes 10000 iu daily    . Magnesium 250 MG TABS Take by mouth daily.    . Multiple Vitamin (MULTIVITAMIN) tablet Take 1 tablet by mouth daily.       No current facility-administered medications on file prior to visit.    Medical History:  Past Medical History:  Diagnosis Date  . Cancer (HCC)    Breast  . 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 Hypercholesteremia   . Osteopenia 11/2015   T score -1.6 stable from prior DEXA  . Serous cyst of ovary    left   Allergies:  Allergies  Allergen Reactions  . Nitrofurantoin Monohyd Macro Nausea Only    Review of Systems:  Review of Systems  Constitutional: Negative.  HENT: Negative.   Eyes: Negative.   Respiratory: Negative.   Cardiovascular: Negative.   Gastrointestinal: Negative.   Genitourinary: Negative.   Musculoskeletal: Negative.   Skin: Negative.   Neurological: Negative.   Endo/Heme/Allergies: Negative.   Psychiatric/Behavioral: Negative.     Family history- Review and unchanged Social history- Review and unchanged Physical Exam: BP 126/80   Pulse 78   Temp 97.7 F (36.5 C)   Resp 14   Ht 5\' 3"  (1.6 m)   Wt 186 lb 6.4 oz (84.6 kg)   SpO2 96%   BMI 33.02 kg/m  Wt Readings from Last 3 Encounters:  08/12/17 186 lb 6.4 oz (84.6 kg)  05/06/17 183 lb 9.6 oz (83.3 kg)  04/22/17 185 lb  3.2 oz (84 kg)   General Appearance: Well nourished, in no apparent distress. Eyes: PERRLA, EOMs, conjunctiva no swelling or erythema Sinuses: No Frontal/maxillary tenderness ENT/Mouth: Ext aud canals clear, TMs without erythema, bulging. No erythema, swelling, or exudate on post pharynx.  Tonsils not swollen or erythematous. Hearing normal.  Neck: Supple, thyroid normal.  Respiratory: Respiratory effort normal, BS equal bilaterally without rales, rhonchi, wheezing or stridor.  Cardio: RRR with no MRGs. Brisk peripheral pulses without edema.  Abdomen: Soft, + BS.  Non tender, no guarding, rebound, hernias, masses. Lymphatics: Non tender without lymphadenopathy.  Musculoskeletal: Full ROM, 5/5 strength, normal gait.  Skin: Warm, dry without rashes, lesions, ecchymosis.  Neuro: Cranial nerves intact. Normal muscle tone, no cerebellar symptoms. Sensation intact.  Psych: Awake and oriented X 3, normal affect, Insight and Judgment appropriate.    Vicie Mutters, PA-C 4:29 PM [facility]

## 2017-08-12 NOTE — Patient Instructions (Signed)
Drink 80-100 oz a day of water, measure it out Eat 3 meals a day, have to do breakfast, eat protein- hard boiled eggs, protein bar like nature valley protein bar, greek yogurt like oikos triple zero, chobani 100, or light n fit greek  We want weight loss that will last so you should lose 1-2 pounds a week.  THAT IS IT! Please pick THREE things a month to change. Once it is a habit check off the item. Then pick another three items off the list to become habits.  If you are already doing a habit on the list GREAT!  Cross that item off! o Don't drink your calories. Ie, alcohol, soda, fruit juice, and sweet tea.  o Drink more water. Drink a glass when you feel hungry or before each meal.  o Eat breakfast - Complex carb and protein (likeDannon light and fit yogurt, oatmeal, fruit, eggs, [place] bacon). o Measure your cereal.  Eat no more than one cup a day. (ie [place]) o Eat an apple a day. o Add a vegetable a day. o Try a new vegetable a month. o Use Pam! Stop using oil or butter to cook. o Don't finish your plate or use smaller plates. o Share your dessert. o Eat sugar free Jello for dessert or frozen grapes. o Don't eat 2-3 hours before bed. o Switch to whole wheat bread, pasta, and brown rice. o Make healthier choices when you eat out. No fries! o Pick baked chicken, NOT fried. o Don't forget to SLOW DOWN when you eat. It is not going anywhere.  o Take the stairs. o Park far away in the parking lot o [employer] (or weights) for 10 minutes while watching TV. o Walk at work for 10 minutes during break. o Walk outside 1 time a week with your friend, kids, dog, or significant other. o Start a walking group at [place] the mall as much as you can tolerate.  o Keep a food diary. o Weigh yourself daily. o Walk for 15 minutes 3 days per week. o Cook at home more often and eat out less.  If life happens and you go back to old habits, it is okay.  Just start over. You can do it!   If  you experience chest pain, get short of breath, or tired during the exercise, please stop immediately and inform your doctor.   HOME CARE INSTRUCTIONS   Do not stand or sit in one position for long periods of time. Do not sit with your legs crossed. Rest with your legs raised during the day.  Your legs have to be higher than your heart so that gravity will force the valves to open, so please really elevate your legs.   Wear elastic stockings or support hose. Do not wear other tight, encircling garments around the legs, pelvis, or waist.  [employer]  has a wide variety of well priced compression stockings. [place], [place] [address] [phone]  Walk as much as possible to increase blood flow.  Raise the foot of your bed at night with 2-inch blocks. SEEK MEDICAL CARE IF:   The skin around your ankle starts to break down.  You have pain, redness, tenderness, or hard swelling developing in your leg over a vein.  You are uncomfortable due to leg pain. Document Released: 09/02/2005 Document Revised: 02/15/2012 Document Reviewed: 01/19/2011 [facility] Patient Information 2014 [place].

## 2017-08-13 LAB — HEPATIC FUNCTION PANEL
AG Ratio: 1.9 (calc) (ref 1.0–2.5)
ALBUMIN MSPROF: 4.4 g/dL (ref 3.6–5.1)
ALT: 17 U/L (ref 6–29)
AST: 21 U/L (ref 10–35)
Alkaline phosphatase (APISO): 87 U/L (ref 33–130)
BILIRUBIN DIRECT: 0.1 mg/dL (ref 0.0–0.2)
BILIRUBIN TOTAL: 0.5 mg/dL (ref 0.2–1.2)
Globulin: 2.3 g/dL (calc) (ref 1.9–3.7)
Indirect Bilirubin: 0.4 mg/dL (calc) (ref 0.2–1.2)
Total Protein: 6.7 g/dL (ref 6.1–8.1)

## 2017-08-13 LAB — CBC WITH DIFFERENTIAL/PLATELET
BASOS ABS: 49 {cells}/uL (ref 0–200)
Basophils Relative: 0.6 %
EOS PCT: 2.7 %
Eosinophils Absolute: 221 cells/uL (ref 15–500)
HEMATOCRIT: 39.2 % (ref 35.0–45.0)
Hemoglobin: 13.2 g/dL (ref 11.7–15.5)
LYMPHS ABS: 2854 {cells}/uL (ref 850–3900)
MCH: 30.6 pg (ref 27.0–33.0)
MCHC: 33.7 g/dL (ref 32.0–36.0)
MCV: 90.7 fL (ref 80.0–100.0)
MPV: 10.7 fL (ref 7.5–12.5)
Monocytes Relative: 7.7 %
NEUTROS ABS: 4444 {cells}/uL (ref 1500–7800)
Neutrophils Relative %: 54.2 %
PLATELETS: 193 10*3/uL (ref 140–400)
RBC: 4.32 10*6/uL (ref 3.80–5.10)
RDW: 13 % (ref 11.0–15.0)
Total Lymphocyte: 34.8 %
WBC: 8.2 10*3/uL (ref 3.8–10.8)
WBCMIX: 631 {cells}/uL (ref 200–950)

## 2017-08-13 LAB — BASIC METABOLIC PANEL WITH GFR
BUN: 20 mg/dL (ref 7–25)
CALCIUM: 9.4 mg/dL (ref 8.6–10.4)
CO2: 27 mmol/L (ref 20–32)
Chloride: 107 mmol/L (ref 98–110)
Creat: 0.88 mg/dL (ref 0.50–0.99)
GFR, EST AFRICAN AMERICAN: 79 mL/min/{1.73_m2} (ref >=60)
GFR, EST NON AFRICAN AMERICAN: 68 mL/min/{1.73_m2} (ref >=60)
Glucose, Bld: 81 mg/dL (ref 65–99)
POTASSIUM: 4.6 mmol/L (ref 3.5–5.3)
SODIUM: 142 mmol/L (ref 135–146)

## 2017-08-13 LAB — LIPID PANEL
Cholesterol: 158 mg/dL (ref <200)
HDL: 57 mg/dL (ref >50)
LDL CHOLESTEROL (CALC): 78 mg/dL
NON-HDL CHOLESTEROL (CALC): 101 mg/dL (ref <130)
TRIGLYCERIDES: 130 mg/dL (ref <150)
Total CHOL/HDL Ratio: 2.8 (calc) (ref <5.0)

## 2017-08-13 LAB — HEMOGLOBIN A1C
HEMOGLOBIN A1C: 5.9 %{Hb} — AB (ref <5.7)
Mean Plasma Glucose: 123 (calc)
eAG (mmol/L): 6.8 (calc)

## 2017-08-13 LAB — TSH: TSH: 1.06 mIU/L (ref 0.40–4.50)

## 2017-08-17 NOTE — Progress Notes (Signed)
Pt aware of lab results & voiced understanding of those results.

## 2017-09-06 DIAGNOSIS — H2513 Age-related nuclear cataract, bilateral: Secondary | ICD-10-CM | POA: Diagnosis not present

## 2017-09-09 ENCOUNTER — Other Ambulatory Visit: Payer: Self-pay | Admitting: Gynecology

## 2017-09-09 DIAGNOSIS — Z1231 Encounter for screening mammogram for malignant neoplasm of breast: Secondary | ICD-10-CM

## 2017-11-05 ENCOUNTER — Ambulatory Visit
Admission: RE | Admit: 2017-11-05 | Discharge: 2017-11-05 | Disposition: A | Discharge status: Home / Self Care | Payer: Medicare Other | Source: Ambulatory Visit | Attending: Gynecology | Admitting: Gynecology

## 2017-11-05 DIAGNOSIS — Z1231 Encounter for screening mammogram for malignant neoplasm of breast: Secondary | ICD-10-CM

## 2017-11-15 ENCOUNTER — Ambulatory Visit: Payer: Self-pay | Admitting: Internal Medicine

## 2018-02-17 ENCOUNTER — Encounter: Payer: Self-pay | Admitting: Gynecology

## 2018-02-17 ENCOUNTER — Ambulatory Visit (INDEPENDENT_AMBULATORY_CARE_PROVIDER_SITE_OTHER): Payer: Medicare Other | Admitting: Gynecology

## 2018-02-17 VITALS — BP 132/78 | Ht 63.0 in | Wt 186.0 lb

## 2018-02-17 DIAGNOSIS — Z01411 Encounter for gynecological examination (general) (routine) with abnormal findings: Secondary | ICD-10-CM | POA: Diagnosis not present

## 2018-02-17 DIAGNOSIS — M8589 Other specified disorders of bone density and structure, multiple sites: Secondary | ICD-10-CM

## 2018-02-17 DIAGNOSIS — N952 Postmenopausal atrophic vaginitis: Secondary | ICD-10-CM

## 2018-02-17 DIAGNOSIS — M8580 Other specified disorders of bone density and structure, unspecified site: Secondary | ICD-10-CM

## 2018-02-17 NOTE — Patient Instructions (Addendum)
Followup for bone density as scheduled. 

## 2018-02-17 NOTE — Progress Notes (Signed)
Shawna [name] May 11, 1951 [id number]        67 y.o.  G0P0000 for breast and pelvic exam.  Without gynecologic complaints  Past medical history,surgical history, problem list, medications, allergies, family history and social history were all reviewed and documented as reviewed in the EPIC chart.  ROS:  Performed with pertinent positives and negatives included in the history, assessment and plan.   Additional significant findings : None   Exam: Bari Mantis assistant Vitals:   02/17/18 1411  BP: 132/78  Weight: 186 lb (84.4 kg)  Height: 5\' 3"  (1.6 m)   Body mass index is 32.95 kg/m.  General appearance:  Normal affect, orientation and appearance. Skin: Grossly normal HEENT: Without gross lesions.  No cervical or supraclavicular adenopathy. Thyroid normal.  Lungs:  Clear without wheezing, rales or rhonchi Cardiac: RR, without RMG Abdominal:  Soft, nontender, without masses, guarding, rebound, organomegaly or hernia Breasts:  Examined lying and sitting without masses, retractions, discharge or axillary adenopathy. Pelvic:  Ext, BUS, Vagina: With atrophic changes  Cervix: With atrophic changes  Uterus: Anteverted, normal size, shape and contour, midline and mobile nontender   Adnexa: Without masses or tenderness    Anus and perineum: Normal   Rectovaginal: Normal sphincter tone without palpated masses or tenderness.    Assessment/Plan:  67 y.o. G0P0000 female for breast and pelvic exam.   1. No significant hot flushes, night sweats, vaginal dryness or any vaginal bleeding. 2. Pap smear 2018.  No Pap smear done today.  No history of significant abnormal Pap smears.  Reviewed current screening guidelines with the patient. 3. Mammography 10/2017.  Continue with annual mammography when due.  History of DCIS left breast.  Exam NED. 4. Colonoscopy overdue and I reminded patient to schedule this. 5. Osteopenia.  DEXA 2016 T score -1.6.  Had used Fosamax for approximately 4 years  stopped in 2010.  Schedule DEXA now patient will follow-up for this. 6. Health maintenance.  No routine lab work done as patient does this elsewhere.  Follow-up 1 year, sooner as needed.   Dara Lords MD, 2:27 PM 02/17/2018

## 2018-04-25 ENCOUNTER — Ambulatory Visit (INDEPENDENT_AMBULATORY_CARE_PROVIDER_SITE_OTHER): Payer: Medicare Other

## 2018-04-25 ENCOUNTER — Other Ambulatory Visit: Payer: Self-pay | Admitting: Gynecology

## 2018-04-25 DIAGNOSIS — Z780 Asymptomatic menopausal state: Secondary | ICD-10-CM

## 2018-04-25 DIAGNOSIS — M8589 Other specified disorders of bone density and structure, multiple sites: Secondary | ICD-10-CM | POA: Diagnosis not present

## 2018-04-25 DIAGNOSIS — M8580 Other specified disorders of bone density and structure, unspecified site: Secondary | ICD-10-CM

## 2018-05-03 ENCOUNTER — Encounter: Payer: Self-pay | Admitting: Gynecology

## 2018-05-10 ENCOUNTER — Ambulatory Visit (INDEPENDENT_AMBULATORY_CARE_PROVIDER_SITE_OTHER): Payer: Medicare Other | Admitting: Internal Medicine

## 2018-05-10 ENCOUNTER — Encounter: Payer: Self-pay | Admitting: Internal Medicine

## 2018-05-10 VITALS — BP 130/74 | HR 67 | Temp 97.7°F | Resp 14 | Ht 62.5 in | Wt 185.4 lb

## 2018-05-10 DIAGNOSIS — Z79899 Other long term (current) drug therapy: Secondary | ICD-10-CM

## 2018-05-10 DIAGNOSIS — R7303 Prediabetes: Secondary | ICD-10-CM | POA: Diagnosis not present

## 2018-05-10 DIAGNOSIS — Z8249 Family history of ischemic heart disease and other diseases of the circulatory system: Secondary | ICD-10-CM | POA: Diagnosis not present

## 2018-05-10 DIAGNOSIS — E559 Vitamin D deficiency, unspecified: Secondary | ICD-10-CM | POA: Diagnosis not present

## 2018-05-10 DIAGNOSIS — Z1211 Encounter for screening for malignant neoplasm of colon: Secondary | ICD-10-CM

## 2018-05-10 DIAGNOSIS — R7309 Other abnormal glucose: Secondary | ICD-10-CM

## 2018-05-10 DIAGNOSIS — I10 Essential (primary) hypertension: Secondary | ICD-10-CM | POA: Diagnosis not present

## 2018-05-10 DIAGNOSIS — Z136 Encounter for screening for cardiovascular disorders: Secondary | ICD-10-CM | POA: Diagnosis not present

## 2018-05-10 DIAGNOSIS — Z1212 Encounter for screening for malignant neoplasm of rectum: Secondary | ICD-10-CM

## 2018-05-10 DIAGNOSIS — E782 Mixed hyperlipidemia: Secondary | ICD-10-CM

## 2018-05-10 NOTE — Progress Notes (Signed)
[facility] Unk Pinto, M.D.     Uvaldo Bristle. Silverio Lay, P.A.-C Liane Comber, [place] [address] [place], [place] [address] Telephone [phone] Telefax [phone]  Comprehensive Evaluation &  Examination     This very nice 67 y.o. MWF  presents for a comprehensive evaluation and management of multiple medical co-morbidities.  Patient has been followed for HTN, HLD, Prediabetes  and Vitamin D Deficiency. Patient is s/p L Breast Lumpectomy in 2006.       Labile HTN predates Circa 2010 since when patient has been monitored expectantly. Patient's BP has been controlled at home and patient denies any cardiac symptoms as chest pain, palpitations, shortness of breath, dizziness or ankle swelling. Today's BP is at goal - 130/74.     Patient's hyperlipidemia is controlled with diet and medications. Patient denies myalgias or other medication SE's. Last lipids were at goal: Lab Results  Component Value Date   CHOL 158 08/12/2017   HDL 57 08/12/2017   LDLCALC 78 08/12/2017   TRIG 130 08/12/2017   CHOLHDL 2.8 08/12/2017      Patient has  Morbid Obesity (BMI 32+) and prediabetes (A1c 5.8%/2010 and 6.1%/2011) and patient denies reactive hypoglycemic symptoms, visual blurring, diabetic polys, or paresthesias. Last A1c was not at goal: Lab Results  Component Value Date   HGBA1C 5.9 (H) 08/12/2017      Finally, patient has history of Vitamin D Deficiency ("36"/2008)  and last Vitamin D was sl low (goal 70-100): Lab Results  Component Value Date   VD25OH 49 04/22/2017   Current Outpatient Medications on File Prior to Visit  Medication Sig  . aspirin 81 MG tablet Take 81 mg by mouth daily.  Marland Kitchen atorvastatin (LIPITOR) 80 MG tablet TAKE ONE TABLET EACH DAY FOR CHOLESTEROL  . Calcium Carb-Cholecalciferol (CALTRATE 600+D3 SOFT) 600-800 MG-UNIT CHEW Chew 1 Units by mouth daily.  . Cholecalciferol (VITAMIN D PO) Take  2,000 Units by mouth. Takes 10000 iu daily  . Magnesium 250 MG TABS Take by mouth daily.  . Multiple Vitamin (MULTIVITAMIN) tablet Take 1 tablet by mouth daily.     No current facility-administered medications on file prior to visit.    Allergies  Allergen Reactions  . Nitrofurantoin Monohyd Macro Nausea Only   Past Medical History:  Diagnosis Date  . Cancer (HCC)    Breast  . 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 Hypercholesteremia   . Osteopenia 04/2018   T score -1.7.  Overall stable from prior DEXA  . Serous cyst of ovary    left   Health Maintenance  Topic Date Due  . OPHTHALMOLOGY EXAM  04/23/2017  . HEMOGLOBIN A1C  02/09/2018  . URINE MICROALBUMIN  04/22/2018  . PNA vac Low Risk Adult (2 of 2 - PPSV23) 04/22/2018  . INFLUENZA VACCINE  07/07/2018  . TETANUS/TDAP  09/14/2019  . MAMMOGRAM  11/06/2019  . COLONOSCOPY  07/20/2021  . DEXA SCAN  Completed  . Hepatitis C Screening  Completed  . FOOT EXAM  Discontinued  . PAP SMEAR  Discontinued   Immunization History  Administered Date(s) Administered  . DTaP 06/26/1999  . Influenza Split 09/05/2014, 10/04/2015  . Influenza Whole 09/01/2013  . Influenza, High Dose Seasonal PF 08/12/2017  . Influenza-Unspecified 09/09/2016  . PPD Test 03/05/2014, 03/11/2015, 03/30/2016  . Pneumococcal Conjugate-13 04/22/2017  . Pneumococcal Polysaccharide-23 02/04/2006  . Tdap 09/13/2009  . Zoster 10/04/2015   Last Colon -  06.08.2014 - Dr Alycia Rossetti - recc 5 yr f/u and to schedule this month  Last MGM - 11.30.2018 recc 1 yr f/u.  Last BMD - 05.20.2019 showed Osteopenia.  Past Surgical History:  Procedure Laterality Date  . BREAST LUMPECTOMY Left 2006  . SALPINGOOPHORECTOMY  1978   left   Family History  Problem Relation Age of Onset  . Hypertension Mother   . Colon cancer Father   . Diabetes Sister   . Heart disease Sister   . Cancer Sister        ? Type  . Breast cancer Paternal Grandmother        Age  46's   Social History   Tobacco Use  . Smoking status: Never Smoker  . Smokeless tobacco: Never Used  Substance Use Topics  . Alcohol use: No    Alcohol/week: 0.0 oz  . Drug use: No    ROS Constitutional: Denies fever, chills, weight loss/gain, headaches, insomnia,  night sweats, and change in appetite. Does c/o fatigue. Eyes: Denies redness, blurred vision, diplopia, discharge, itchy, watery eyes.  ENT: Denies discharge, congestion, post nasal drip, epistaxis, sore throat, earache, hearing loss, dental pain, Tinnitus, Vertigo, Sinus pain, snoring.  Cardio: Denies chest pain, palpitations, irregular heartbeat, syncope, dyspnea, diaphoresis, orthopnea, PND, claudication, edema Respiratory: denies cough, dyspnea, DOE, pleurisy, hoarseness, laryngitis, wheezing.  Gastrointestinal: Denies dysphagia, heartburn, reflux, water brash, pain, cramps, nausea, vomiting, bloating, diarrhea, constipation, hematemesis, melena, hematochezia, jaundice, hemorrhoids Genitourinary: Denies dysuria, frequency, urgency, nocturia, hesitancy, discharge, hematuria, flank pain Breast: Breast lumps, nipple discharge, bleeding.  Musculoskeletal: Denies arthralgia, myalgia, stiffness, Jt. Swelling, pain, limp, and strain/sprain. Denies falls. Skin: Denies puritis, rash, hives, warts, acne, eczema, changing in skin lesion Neuro: No weakness, tremor, incoordination, spasms, paresthesia, pain Psychiatric: Denies confusion, memory loss, sensory loss. Denies Depression. Endocrine: Denies change in weight, skin, hair change, nocturia, and paresthesia, diabetic polys, visual blurring, hyper / hypo glycemic episodes.  Heme/Lymph: No excessive bleeding, bruising, enlarged lymph nodes.  Physical Exam  BP 130/74   Pulse 67   Temp 97.7 F (36.5 C)   Resp 14   Ht 5' 2.5" (1.588 m)   Wt 185 lb 6.4 oz (84.1 kg)   SpO2 97%   BMI 33.37 kg/m   General Appearance: Well nourished, well groomed and in no apparent  distress.  Eyes: PERRLA, EOMs, conjunctiva no swelling or erythema, normal fundi and vessels. Sinuses: No frontal/maxillary tenderness ENT/Mouth: EACs patent / TMs  nl. Nares clear without erythema, swelling, mucoid exudates. Oral hygiene is good. No erythema, swelling, or exudate. Tongue normal, non-obstructing. Tonsils not swollen or erythematous. Hearing normal.  Neck: Supple, thyroid not palpable. No bruits, nodes or JVD. Respiratory: Respiratory effort normal.  BS equal and clear bilateral without rales, rhonci, wheezing or stridor. Cardio: Heart sounds are normal with regular rate and rhythm and no murmurs, rubs or gallops. Peripheral pulses are normal and equal bilaterally without edema. No aortic or femoral bruits. Chest: symmetric with normal excursions and percussion. Breasts: Symmetric, without lumps, nipple discharge, retractions, or fibrocystic changes.  Abdomen: Flat, soft with bowel sounds active. Nontender, no guarding, rebound, hernias, masses, or organomegaly.  Lymphatics: Non tender without lymphadenopathy.  Genitourinary:  Musculoskeletal: Full ROM all peripheral extremities, joint stability, 5/5 strength, and normal gait. Skin: Warm and dry without rashes, lesions, cyanosis, clubbing or  ecchymosis.  Neuro: Cranial nerves intact, reflexes equal bilaterally. Normal muscle tone, no cerebellar symptoms. Sensation intact.  Pysch: Alert and oriented X 3, normal affect, Insight  and Judgment appropriate.   Assessment and Plan  1. Essential hypertension  - EKG 12-Lead - Urinalysis, Routine w reflex microscopic - Microalbumin / creatinine urine ratio - CBC with Differential/Platelet - COMPLETE METABOLIC PANEL WITH GFR - Magnesium - TSH  2. Hyperlipidemia, mixed  - EKG 12-Lead - Lipid panel - TSH  3. Abnormal glucose  - EKG 12-Lead - Hemoglobin A1c - Insulin, random  4. Vitamin D deficiency  - VITAMIN D 25 Hydroxyl  5. Prediabetes  - EKG 12-Lead -  Hemoglobin A1c - Insulin, random  6. Screening for colorectal cancer  - POC Hemoccult Bld/Stl  7. Screening for ischemic heart disease  - EKG 12-Lead  8. FH: heart disease  - EKG 12-Lead  9. Medication management  - Urinalysis, Routine w reflex microscopic - Microalbumin / creatinine urine ratio - CBC with Differential/Platelet - COMPLETE METABOLIC PANEL WITH GFR - Magnesium - Lipid panel - TSH - Hemoglobin A1c - Insulin, random - VITAMIN D 25 Hydroxyl           Patient was counseled in prudent diet to achieve/maintain BMI less than 25 for weight control, BP monitoring, regular exercise and medications. Discussed med's effects and SE's. Screening labs and tests as requested with regular follow-up as recommended. Over 40 minutes of exam, counseling, chart review and high complex critical decision making was performed.

## 2018-05-10 NOTE — Patient Instructions (Signed)

## 2018-05-11 LAB — COMPLETE METABOLIC PANEL WITH GFR
AG RATIO: 1.9 (calc) (ref 1.0–2.5)
ALBUMIN MSPROF: 4.2 g/dL (ref 3.6–5.1)
ALT: 15 U/L (ref 6–29)
AST: 19 U/L (ref 10–35)
Alkaline phosphatase (APISO): 99 U/L (ref 33–130)
BUN: 19 mg/dL (ref 7–25)
CALCIUM: 9.1 mg/dL (ref 8.6–10.4)
CO2: 26 mmol/L (ref 20–32)
Chloride: 106 mmol/L (ref 98–110)
Creat: 0.79 mg/dL (ref 0.50–0.99)
GFR, EST AFRICAN AMERICAN: 90 mL/min/{1.73_m2} (ref >=60)
GFR, EST NON AFRICAN AMERICAN: 78 mL/min/{1.73_m2} (ref >=60)
GLUCOSE: 106 mg/dL — AB (ref 65–99)
Globulin: 2.2 g/dL (calc) (ref 1.9–3.7)
Potassium: 4.3 mmol/L (ref 3.5–5.3)
Sodium: 141 mmol/L (ref 135–146)
TOTAL PROTEIN: 6.4 g/dL (ref 6.1–8.1)
Total Bilirubin: 0.4 mg/dL (ref 0.2–1.2)

## 2018-05-11 LAB — LIPID PANEL
CHOL/HDL RATIO: 3.2 (calc) (ref <5.0)
Cholesterol: 156 mg/dL (ref <200)
HDL: 49 mg/dL — AB (ref >50)
LDL Cholesterol (Calc): 90 mg/dL (calc)
NON-HDL CHOLESTEROL (CALC): 107 mg/dL (ref <130)
TRIGLYCERIDES: 80 mg/dL (ref <150)

## 2018-05-11 LAB — HEMOGLOBIN A1C
EAG (MMOL/L): 7 (calc)
Hgb A1c MFr Bld: 6 % of total Hgb — ABNORMAL HIGH (ref <5.7)
MEAN PLASMA GLUCOSE: 126 (calc)

## 2018-05-11 LAB — CBC WITH DIFFERENTIAL/PLATELET
BASOS PCT: 0.5 %
Basophils Absolute: 31 cells/uL (ref 0–200)
Eosinophils Absolute: 229 cells/uL (ref 15–500)
Eosinophils Relative: 3.7 %
HCT: 36.4 % (ref 35.0–45.0)
HEMOGLOBIN: 12.7 g/dL (ref 11.7–15.5)
Lymphs Abs: 1531 cells/uL (ref 850–3900)
MCH: 30.5 pg (ref 27.0–33.0)
MCHC: 34.9 g/dL (ref 32.0–36.0)
MCV: 87.3 fL (ref 80.0–100.0)
MPV: 10.2 fL (ref 7.5–12.5)
Monocytes Relative: 8.1 %
Neutro Abs: 3906 cells/uL (ref 1500–7800)
Neutrophils Relative %: 63 %
PLATELETS: 190 10*3/uL (ref 140–400)
RBC: 4.17 10*6/uL (ref 3.80–5.10)
RDW: 12.9 % (ref 11.0–15.0)
TOTAL LYMPHOCYTE: 24.7 %
WBC: 6.2 10*3/uL (ref 3.8–10.8)
WBCMIX: 502 {cells}/uL (ref 200–950)

## 2018-05-11 LAB — URINALYSIS, ROUTINE W REFLEX MICROSCOPIC
BILIRUBIN URINE: NEGATIVE
GLUCOSE, UA: NEGATIVE
HGB URINE DIPSTICK: NEGATIVE
KETONES UR: NEGATIVE
Leukocytes, UA: NEGATIVE
Nitrite: NEGATIVE
PH: 7.5 (ref 5.0–8.0)
Protein, ur: NEGATIVE
Specific Gravity, Urine: 1.016 (ref 1.001–1.03)

## 2018-05-11 LAB — MICROALBUMIN / CREATININE URINE RATIO
Creatinine, Urine: 50 mg/dL (ref 20–275)
MICROALB/CREAT RATIO: 12 ug/mg{creat} (ref <30)
Microalb, Ur: 0.6 mg/dL

## 2018-05-11 LAB — VITAMIN D 25 HYDROXY (VIT D DEFICIENCY, FRACTURES): VIT D 25 HYDROXY: 41 ng/mL (ref 30–100)

## 2018-05-11 LAB — TSH: TSH: 1.2 mIU/L (ref 0.40–4.50)

## 2018-05-11 LAB — INSULIN, RANDOM: INSULIN: 14.7 u[IU]/mL (ref 2.0–19.6)

## 2018-05-11 LAB — MAGNESIUM: Magnesium: 2.1 mg/dL (ref 1.5–2.5)

## 2018-06-17 DIAGNOSIS — Z1211 Encounter for screening for malignant neoplasm of colon: Secondary | ICD-10-CM | POA: Diagnosis not present

## 2018-06-17 DIAGNOSIS — K640 First degree hemorrhoids: Secondary | ICD-10-CM | POA: Diagnosis not present

## 2018-06-17 DIAGNOSIS — Z800 Family history of malignant neoplasm of digestive organs: Secondary | ICD-10-CM | POA: Diagnosis not present

## 2018-06-17 LAB — HM COLONOSCOPY

## 2018-06-23 DIAGNOSIS — H903 Sensorineural hearing loss, bilateral: Secondary | ICD-10-CM | POA: Diagnosis not present

## 2018-06-28 NOTE — Progress Notes (Signed)
Assessment and Plan:  Shawna [name] was seen today for urinary urgency and over active bladder.  Diagnoses and all orders for this visit:  Dysuria Medications: TMP/SMX pending UA results Maintain adequate hydration. Follow up if symptoms not improving, and as needed. -     Urinalysis w microscopic + reflex cultur  Further disposition pending results of labs. Discussed med's effects and SE's.   Over 15 minutes of exam, counseling, chart review, and critical decision making was performed.   Future Appointments  Date Time Provider [place]  08/30/2018  9:00 AM Liane Comber, NP [facility] None  12/01/2018  9:30 AM Unk Pinto, MD [facility] None  06/06/2019  9:00 AM Unk Pinto, MD [facility] None    ------------------------------------------------------------------------------------------------------------------   HPI BP 140/80   Pulse 76   Temp (!) 97.3 F (36.3 C)   Ht 5' 2.5" (1.588 m)   Wt 186 lb (84.4 kg)   SpO2 97%   BMI 33.48 kg/m   67 y.o.female presents for evaluation of UTI symptoms; she reports intermittent burning near her urethra, urgency, lower abdominal pressure, cloudy urine ongoing for more than 1 week. She feels this started soon after her recent colonoscopy on 7/12 and wonders if this is related. She denies fever/chills, n/v/d, vaginal discharge, pain with intercourse, rashes.   She tried taking ibuprofen but didn't not any improvement. She does have remote hx of UTI; she is married, no new partners. No pain with intercourse.   Past Medical History:  Diagnosis Date  . Cancer (HCC)    Breast  . 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 Hypercholesteremia   . Osteopenia 04/2018   T score -1.7.  Overall stable from prior DEXA  . Serous cyst of ovary    left     Allergies  Allergen Reactions  . Nitrofurantoin Monohyd Macro Nausea Only    Current Outpatient Medications on File Prior to Visit  Medication Sig  . aspirin 81 MG  tablet Take 81 mg by mouth daily.  Marland Kitchen atorvastatin (LIPITOR) 80 MG tablet TAKE ONE TABLET EACH DAY FOR CHOLESTEROL  . Calcium Carb-Cholecalciferol (CALTRATE 600+D3 SOFT) 600-800 MG-UNIT CHEW Chew 1 Units by mouth daily.  . Cholecalciferol (VITAMIN D PO) Take 2,000 Units by mouth. Takes 10000 iu daily  . Magnesium 250 MG TABS Take by mouth daily.  . Multiple Vitamin (MULTIVITAMIN) tablet Take 1 tablet by mouth daily.     No current facility-administered medications on file prior to visit.     ROS: all negative except above.   Physical Exam:  BP 140/80   Pulse 76   Temp (!) 97.3 F (36.3 C)   Ht 5' 2.5" (1.588 m)   Wt 186 lb (84.4 kg)   SpO2 97%   BMI 33.48 kg/m   General Appearance: Well nourished, in no apparent distress. ENT/Mouth: Hearing normal.  Neck: Supple.  Respiratory: Respiratory effort normal, BS equal bilaterally without rales, rhonchi, wheezing or stridor.  Cardio: RRR with no MRGs. Brisk peripheral pulses without edema.  Abdomen: Soft, + BS.  Non tender, no guarding, rebound, hernias, masses. Lymphatics: Non tender without lymphadenopathy.  Musculoskeletal: Symmetrical strength, normal gait.  Skin: Warm, dry without rashes, lesions, ecchymosis.  Neuro: Cranial nerves intact. Normal muscle tone, no cerebellar symptoms. Sensation intact.  Psych: Awake and oriented X 3, normal affect, Insight and Judgment appropriate.     Izora Ribas, NP 4:50 PM [facility]

## 2018-06-29 ENCOUNTER — Ambulatory Visit (INDEPENDENT_AMBULATORY_CARE_PROVIDER_SITE_OTHER): Payer: Medicare Other | Admitting: Adult Health

## 2018-06-29 ENCOUNTER — Encounter: Payer: Self-pay | Admitting: Adult Health

## 2018-06-29 VITALS — BP 140/80 | HR 76 | Temp 97.3°F | Ht 62.5 in | Wt 186.0 lb

## 2018-06-29 DIAGNOSIS — R300 Dysuria: Secondary | ICD-10-CM

## 2018-06-29 NOTE — Patient Instructions (Signed)

## 2018-06-30 ENCOUNTER — Other Ambulatory Visit: Payer: Self-pay | Admitting: Adult Health

## 2018-06-30 MED ORDER — SULFAMETHOXAZOLE-TRIMETHOPRIM 800-160 MG PO TABS: 1.0000 | ORAL_TABLET | Freq: Two times a day (BID) | ORAL | 0 refills | Status: DC

## 2018-07-02 LAB — URINE CULTURE
MICRO NUMBER: 90881003
SPECIMEN QUALITY: ADEQUATE

## 2018-07-02 LAB — URINALYSIS W MICROSCOPIC + REFLEX CULTURE
BACTERIA UA: NONE SEEN /HPF
Bilirubin Urine: NEGATIVE
Glucose, UA: NEGATIVE
HGB URINE DIPSTICK: NEGATIVE
Hyaline Cast: NONE SEEN /LPF
KETONES UR: NEGATIVE
Nitrites, Initial: NEGATIVE
Protein, ur: NEGATIVE
Specific Gravity, Urine: 1.016 (ref 1.001–1.03)
pH: 6 (ref 5.0–8.0)

## 2018-07-02 LAB — CULTURE INDICATED

## 2018-07-07 ENCOUNTER — Encounter: Payer: Self-pay | Admitting: *Deleted

## 2018-08-30 ENCOUNTER — Ambulatory Visit: Payer: Self-pay | Admitting: Adult Health

## 2018-08-31 NOTE — Progress Notes (Signed)
WELCOME TO MEDICARE ANNUAL WELLNESS VISIT AND 3 MONTH  Assessment:   Encounter for Medicare annual wellness exam  Essential hypertension - continue medications, DASH diet, exercise and monitor at home. Call if greater than 130/80.   History of ductal carcinoma in situ (DCIS) of breast, left, s/p resection UTD on mammograms Continue to monitor  Hyperlipidemia -continue medications, check lipids, decrease fatty foods, increase activity.   Prediabetes Discussed general issues about diabetes pathophysiology and management., Educational material distributed., Suggested low cholesterol diet., Encouraged aerobic exercise., Discussed foot care., Reminded to get yearly retinal exam.  Vitamin D deficiency Continue supplement  Medication management  Obesity - long discussion about weight loss, diet, and exercise  Need for pneumonia vaccine - 23 valent administered today  Need for influenza vaccine - high dose quad administered today  Over 40 minutes of exam, counseling, chart review and critical decision making was performed Future Appointments  Date Time Provider [place]  12/01/2018  9:30 AM Unk Pinto, MD [facility] None  06/06/2019  9:00 AM Unk Pinto, MD [facility] None     Plan:   During the course of the visit the patient was educated and counseled about appropriate screening and preventive services including:    Pneumococcal vaccine   Prevnar 13  Influenza vaccine  Td vaccine  Screening electrocardiogram  Bone densitometry screening  Colorectal cancer screening  Diabetes screening  Glaucoma screening  Nutrition counseling   Advanced directives: requested   Subjective:  Shawna [name] is a 67 y.o. female who presents for Medicare Annual Wellness Visit and OV.     BMI is Body mass index is 33.3 kg/m., she has not been working on diet and exercise. Wt Readings from Last 3 Encounters:  09/01/18 185 lb (83.9 kg)  06/29/18 186 lb  (84.4 kg)  05/10/18 185 lb 6.4 oz (84.1 kg)   Her blood pressure has been controlled at home, today their BP is BP: 120/68  She does not workout but has part time job and takes care of her husband. She denies chest pain, shortness of breath, dizziness. Patient is s/p lumpectomy of L breast DCIS in 2006.    She is on cholesterol medication and denies myalgias. Her cholesterol is at goal. The cholesterol last visit was:   Lab Results  Component Value Date   CHOL 156 05/10/2018   HDL 49 (L) 05/10/2018   LDLCALC 90 05/10/2018   TRIG 80 05/10/2018   CHOLHDL 3.2 05/10/2018    She has been working on diet and exercise for prediabetes, and denies paresthesia of the feet, polydipsia, polyuria and visual disturbances. Last A1C in the office was:  Lab Results  Component Value Date   HGBA1C 6.0 (H) 05/10/2018   Last GFR: Lab Results  Component Value Date   GFRNONAA 78 05/10/2018   Patient is on Vitamin D supplement.   Lab Results  Component Value Date   VD25OH 41 05/10/2018       Medication Review: Current Outpatient Medications on File Prior to Visit  Medication Sig Dispense Refill  . aspirin 81 MG tablet Take 81 mg by mouth daily.    Marland Kitchen atorvastatin (LIPITOR) 80 MG tablet TAKE ONE TABLET EACH DAY FOR CHOLESTEROL 90 tablet 1  . Calcium Carb-Cholecalciferol (CALTRATE 600+D3 SOFT) 600-800 MG-UNIT CHEW Chew 1 Units by mouth daily.    . Cholecalciferol (VITAMIN D PO) Take 2,000 Units by mouth. Takes 10000 iu daily    . Magnesium 250 MG TABS Take by mouth daily.    Marland Kitchen  Multiple Vitamin (MULTIVITAMIN) tablet Take 1 tablet by mouth daily.      Marland Kitchen sulfamethoxazole-trimethoprim (BACTRIM DS,SEPTRA DS) 800-160 MG tablet Take 1 tablet by mouth 2 (two) times daily. 10 tablet 0   No current facility-administered medications on file prior to visit.     Allergies  Allergen Reactions  . Nitrofurantoin Monohyd Macro Nausea Only    Current Problems (verified) Patient Active Problem List    Diagnosis Date Noted  . Medication management 09/05/2014  . Obesity (BMI 30.0-34.9) 09/05/2014  . Essential hypertension 11/24/2013  . Hyperlipidemia 11/24/2013  . Prediabetes 11/24/2013  . Vitamin D deficiency 11/24/2013  . DCIS (ductal carcinoma in situ) of breast, Left lumpectomy. 09/23/2005.     Screening Tests Immunization History  Administered Date(s) Administered  . DTaP 06/26/1999  . Influenza Split 09/05/2014, 10/04/2015  . Influenza Whole 09/01/2013  . Influenza, High Dose Seasonal PF 08/12/2017, 09/01/2018  . Influenza-Unspecified 09/09/2016  . PPD Test 03/05/2014, 03/11/2015, 03/30/2016  . Pneumococcal Conjugate-13 04/22/2017  . Pneumococcal Polysaccharide-23 02/04/2006, 09/01/2018  . Tdap 09/13/2009  . Zoster 10/04/2015   Preventative care: Last colonoscopy: 06/17/2018, q 5 years, dad with colon cancer, Dr. Oletta Lamas Last mammogram: 11/05/2017 Last pap smear/pelvic exam: 02/2017 follows Dr. Phineas Real DEXA: 04/2018 - osteopenia Xray Cervical spine 2014  Prior vaccinations: TD or Tdap: 2010  Influenza: 2018  Pneumococcal: 2007 Prevnar13: 2018 Shingles/Zostavax: 2016  Names of Other Physician/Practitioners you currently use: 1. [facility] here for primary care 2. Dr. Delman Cheadle, eye doctor, last visit 2018, has upcoming appointment 3. Dr. Felipa Eth, dentist, last visit 2019, q 6 months  Patient Care Team: Unk Pinto, MD as PCP - General (Internal Medicine)  SURGICAL HISTORY She  has a past surgical history that includes Salpingoophorectomy (1978) and Breast lumpectomy (Left, 2006). FAMILY HISTORY Her family history includes Breast cancer in her paternal grandmother; Cancer in her sister; Colon cancer in her father; Diabetes in her sister; Heart disease in her sister; Hypertension in her mother. SOCIAL HISTORY She  reports that she has never smoked. She has never used smokeless tobacco. She reports that she does not drink  alcohol or use drugs.   MEDICARE WELLNESS OBJECTIVES: Physical activity: Current Exercise Habits: The patient does not participate in regular exercise at present(very busy as caregiver), Exercise limited by: Other - see comments Cardiac risk factors: Cardiac Risk Factors include: advanced age (>62men, >80 women);dyslipidemia;hypertension;obesity (BMI >30kg/m2) Depression/mood screen:   Depression screen [facility] 2/9 09/01/2018  Decreased Interest 0  Down, Depressed, Hopeless 0  PHQ - 2 Score 0    ADLs:  In your present state of health, do you have any difficulty performing the following activities: 09/01/2018 05/10/2018  Hearing? N N  Comment has bilateral hearing aids -  Vision? N N  Difficulty concentrating or making decisions? N N  Walking or climbing stairs? N N  Dressing or bathing? N N  Doing errands, shopping? N N  Some recent data might be hidden     Cognitive Testing  Alert? Yes  Normal Appearance?Yes  Oriented to person? Yes  Place? Yes   Time? Yes  Recall of three objects?  Yes  Can perform simple calculations? Yes  Displays appropriate judgment?Yes  Can read the correct time from a watch face?Yes  EOL planning: Does Patient Have a Medical Advance Directive?: No Would patient like information on creating a medical advance directive?: No - Patient declined  Review of Systems  Constitutional: Negative.   HENT: Negative.   Eyes:  Negative.   Respiratory: Negative.   Cardiovascular: Negative.   Gastrointestinal: Negative.   Genitourinary: Negative.   Musculoskeletal: Negative.   Skin: Negative.   Neurological: Negative.   Endo/Heme/Allergies: Negative.   Psychiatric/Behavioral: Negative.      Objective:     Today's Vitals   09/01/18 1413  BP: 120/68  Pulse: 79  Temp: (!) 97.3 F (36.3 C)  SpO2: 95%  Weight: 185 lb (83.9 kg)  Height: 5' 2.5" (1.588 m)   Body mass index is 33.3 kg/m.  General appearance: alert, no distress, WD/WN, female HEENT:  normocephalic, sclerae anicteric, TMs pearly, nares patent, no discharge or erythema, pharynx normal Oral cavity: MMM, no lesions Neck: supple, no lymphadenopathy, no thyromegaly, no masses Heart: RRR, normal S1, S2, no murmurs Lungs: CTA bilaterally, no wheezes, rhonchi, or rales Abdomen: +bs, soft, non tender, non distended, no masses, no hepatomegaly, no splenomegaly Musculoskeletal: nontender, no swelling, no obvious deformity Extremities: no edema, no cyanosis, no clubbing Pulses: 2+ symmetric, upper and lower extremities, normal cap refill Neurological: alert, oriented x 3, CN2-12 intact, strength normal upper extremities and lower extremities, sensation normal throughout, DTRs 2+ throughout, no cerebellar signs, gait normal Psychiatric: normal affect, behavior normal, pleasant   Medicare Attestation I have personally reviewed: The patient's medical and social history Their use of alcohol, tobacco or illicit drugs Their current medications and supplements The patient's functional ability including ADLs,fall risks, home safety risks, cognitive, and hearing and visual impairment Diet and physical activities Evidence for depression or mood disorders  The patient's weight, height, BMI, and visual acuity have been recorded in the chart.  I have made referrals, counseling, and provided education to the patient based on review of the above and I have provided the patient with a written personalized care plan for preventive services.     Shawna Ribas, NP   09/01/2018

## 2018-09-01 ENCOUNTER — Ambulatory Visit (INDEPENDENT_AMBULATORY_CARE_PROVIDER_SITE_OTHER): Payer: Medicare Other | Admitting: Adult Health

## 2018-09-01 ENCOUNTER — Encounter: Payer: Self-pay | Admitting: Adult Health

## 2018-09-01 VITALS — BP 120/68 | HR 79 | Temp 97.3°F | Ht 62.5 in | Wt 185.0 lb

## 2018-09-01 DIAGNOSIS — I10 Essential (primary) hypertension: Secondary | ICD-10-CM | POA: Diagnosis not present

## 2018-09-01 DIAGNOSIS — E782 Mixed hyperlipidemia: Secondary | ICD-10-CM

## 2018-09-01 DIAGNOSIS — Z0001 Encounter for general adult medical examination with abnormal findings: Secondary | ICD-10-CM

## 2018-09-01 DIAGNOSIS — R7303 Prediabetes: Secondary | ICD-10-CM | POA: Diagnosis not present

## 2018-09-01 DIAGNOSIS — Z23 Encounter for immunization: Secondary | ICD-10-CM | POA: Diagnosis not present

## 2018-09-01 DIAGNOSIS — R6889 Other general symptoms and signs: Secondary | ICD-10-CM

## 2018-09-01 DIAGNOSIS — Z0000 Encounter for general adult medical examination without abnormal findings: Secondary | ICD-10-CM

## 2018-09-01 DIAGNOSIS — E669 Obesity, unspecified: Secondary | ICD-10-CM | POA: Diagnosis not present

## 2018-09-01 DIAGNOSIS — Z86000 Personal history of in-situ neoplasm of breast: Secondary | ICD-10-CM

## 2018-09-01 DIAGNOSIS — Z79899 Other long term (current) drug therapy: Secondary | ICD-10-CM | POA: Diagnosis not present

## 2018-09-01 DIAGNOSIS — E559 Vitamin D deficiency, unspecified: Secondary | ICD-10-CM | POA: Diagnosis not present

## 2018-09-01 DIAGNOSIS — N83209 Unspecified ovarian cyst, unspecified side: Secondary | ICD-10-CM

## 2018-09-01 NOTE — Patient Instructions (Addendum)
Shawna [name] , Thank you for taking time to come for your Medicare Wellness Visit. I appreciate your ongoing commitment to your health goals. Please review the following plan we discussed and let me know if I can assist you in the future.   These are the goals we discussed: Goals    . HEMOGLOBIN A1C < 5.7    . Weight (lb) < 180 lb (81.6 kg)       This is a list of the screening recommended for you and due dates:  Health Maintenance  Topic Date Due  . Tetanus Vaccine  09/14/2019  . Mammogram  11/06/2019  . Colon Cancer Screening  06/18/2023  . Flu Shot  Completed  . DEXA scan (bone density measurement)  Completed  .  Hepatitis C: One time screening is recommended by Center for Disease Control  (CDC) for  adults born from [birth year] through 1965.   Completed  . Pneumonia vaccines  Completed  . Pap Smear  Discontinued    Know what a healthy weight is for you (roughly BMI <25) and aim to maintain this  Aim for 7+ servings of fruits and vegetables daily  65-80+ fluid ounces of water or unsweet tea for healthy kidneys  Limit to max 1 drink of alcohol per day; avoid smoking/tobacco  Limit animal fats in diet for cholesterol and heart health - choose grass fed whenever available  Avoid highly processed foods, and foods high in saturated/trans fats  Aim for low stress - take time to unwind and care for your mental health  Aim for 150 min of moderate intensity exercise weekly for heart health, and weights twice weekly for bone health  Aim for 7-9 hours of sleep daily     Drink 1/2 your body weight in fluid ounces of water daily; drink a tall glass of water 30 min before meals  Don't eat until you're stuffed- listen to your stomach and eat until you are 80% full   Try eating off of a salad plate; wait 10 min after finishing before going back for seconds  Start by eating the vegetables on your plate; aim for 50% of your meals to be fruits or vegetables  Then eat your protein - lean  meats (grass fed if possible), fish, beans, nuts in moderation  Eat your carbs/starch last ONLY if you still are hungry. If you can, stop before finishing it all  Avoid sugar and flour - the closer it looks to it's original form in nature, typically the better it is for you  Splurge in moderation - "assign" days when you get to splurge and have the "bad stuff" - I like to follow a 80% - 20% plan- "good" choices 80 % of the time, "bad" choices in moderation 20% of the time  Simple equation is: Calories out > calories in = weight loss - even if you eat the bad stuff, if you limit portions, you will still lose weight       When it comes to diets, agreement about the perfect plan isn't easy to find, even among the experts. Experts at the [employer] developed an idea known as the Healthy Eating Plate. Just imagine a plate divided into logical, healthy portions.  The emphasis is on diet quality:  Load up on vegetables and fruits - one-half of your plate: Aim for color and variety, and remember that potatoes don't count.  Go for whole grains - one-quarter of your plate: Whole wheat, barley,  wheat berries, quinoa, oats, brown rice, and foods made with them. If you want pasta, go with whole wheat pasta.  Protein power - one-quarter of your plate: Fish, chicken, beans, and nuts are all healthy, versatile protein sources. Limit red meat.  The diet, however, does go beyond the plate, offering a few other suggestions.  Use healthy plant oils, such as olive, canola, soy, corn, sunflower and peanut. Check the labels, and avoid partially hydrogenated oil, which have unhealthy trans fats.  If you're thirsty, drink water. Coffee and tea are good in moderation, but skip sugary drinks and limit milk and dairy products to one or two daily servings.  The type of carbohydrate in the diet is more important than the amount. Some sources of carbohydrates, such as vegetables, fruits, whole  grains, and beans-are healthier than others.  Finally, stay active.

## 2018-09-02 LAB — COMPLETE METABOLIC PANEL WITH GFR
AG RATIO: 1.8 (calc) (ref 1.0–2.5)
ALKALINE PHOSPHATASE (APISO): 78 U/L (ref 33–130)
ALT: 15 U/L (ref 6–29)
AST: 19 U/L (ref 10–35)
Albumin: 4.4 g/dL (ref 3.6–5.1)
BILIRUBIN TOTAL: 0.5 mg/dL (ref 0.2–1.2)
BUN: 15 mg/dL (ref 7–25)
CHLORIDE: 105 mmol/L (ref 98–110)
CO2: 28 mmol/L (ref 20–32)
Calcium: 9.9 mg/dL (ref 8.6–10.4)
Creat: 0.75 mg/dL (ref 0.50–0.99)
GFR, Est African American: 96 mL/min/{1.73_m2} (ref >=60)
GFR, Est Non African American: 82 mL/min/{1.73_m2} (ref >=60)
GLUCOSE: 79 mg/dL (ref 65–99)
Globulin: 2.4 g/dL (calc) (ref 1.9–3.7)
POTASSIUM: 4.2 mmol/L (ref 3.5–5.3)
Sodium: 141 mmol/L (ref 135–146)
Total Protein: 6.8 g/dL (ref 6.1–8.1)

## 2018-09-02 LAB — CBC WITH DIFFERENTIAL/PLATELET
BASOS PCT: 0.5 %
Basophils Absolute: 44 cells/uL (ref 0–200)
EOS ABS: 96 {cells}/uL (ref 15–500)
Eosinophils Relative: 1.1 %
HEMATOCRIT: 39.7 % (ref 35.0–45.0)
Hemoglobin: 13.6 g/dL (ref 11.7–15.5)
LYMPHS ABS: 2471 {cells}/uL (ref 850–3900)
MCH: 30.1 pg (ref 27.0–33.0)
MCHC: 34.3 g/dL (ref 32.0–36.0)
MCV: 87.8 fL (ref 80.0–100.0)
MPV: 10.9 fL (ref 7.5–12.5)
Monocytes Relative: 5.2 %
Neutro Abs: 5638 cells/uL (ref 1500–7800)
Neutrophils Relative %: 64.8 %
Platelets: 192 10*3/uL (ref 140–400)
RBC: 4.52 10*6/uL (ref 3.80–5.10)
RDW: 13.4 % (ref 11.0–15.0)
Total Lymphocyte: 28.4 %
WBC: 8.7 10*3/uL (ref 3.8–10.8)
WBCMIX: 452 {cells}/uL (ref 200–950)

## 2018-09-02 LAB — LIPID PANEL
Cholesterol: 180 mg/dL (ref <200)
HDL: 52 mg/dL (ref >50)
LDL Cholesterol (Calc): 106 mg/dL (calc) — ABNORMAL HIGH
NON-HDL CHOLESTEROL (CALC): 128 mg/dL (ref <130)
TRIGLYCERIDES: 128 mg/dL (ref <150)
Total CHOL/HDL Ratio: 3.5 (calc) (ref <5.0)

## 2018-09-02 LAB — MAGNESIUM: MAGNESIUM: 2.2 mg/dL (ref 1.5–2.5)

## 2018-09-02 LAB — HEMOGLOBIN A1C
EAG (MMOL/L): 7 (calc)
Hgb A1c MFr Bld: 6 % of total Hgb — ABNORMAL HIGH (ref <5.7)
Mean Plasma Glucose: 126 (calc)

## 2018-09-02 LAB — VITAMIN D 25 HYDROXY (VIT D DEFICIENCY, FRACTURES): VIT D 25 HYDROXY: 48 ng/mL (ref 30–100)

## 2018-09-02 LAB — TSH: TSH: 1.09 mIU/L (ref 0.40–4.50)

## 2018-09-20 DIAGNOSIS — H2513 Age-related nuclear cataract, bilateral: Secondary | ICD-10-CM | POA: Diagnosis not present

## 2018-10-19 ENCOUNTER — Other Ambulatory Visit: Payer: Self-pay | Admitting: Gynecology

## 2018-10-19 DIAGNOSIS — Z1231 Encounter for screening mammogram for malignant neoplasm of breast: Secondary | ICD-10-CM

## 2018-11-30 ENCOUNTER — Encounter: Payer: Self-pay | Admitting: Internal Medicine

## 2018-11-30 NOTE — Progress Notes (Signed)
This very nice 67 y.o. MWF presents for 6 month follow up with HTN, HLD, Pre-Diabetes and Vitamin D Deficiency. Patient has hx/o Lt breast Lumpectomy in 2006.      Patient is treated for HTN (2010) & BP has been controlled at home. Today's BP was initially borderline elevated & rechecked at goal - 136/76. Patient has had no complaints of any cardiac type chest pain, palpitations, dyspnea / orthopnea / PND, dizziness, claudication, or dependent edema.     Hyperlipidemia is controlled with diet & meds. Patient denies myalgias or other med SE's. Last Lipids were  Lab Results  Component Value Date   CHOL 180 09/01/2018   HDL 52 09/01/2018   LDLCALC 106 (H) 09/01/2018   TRIG 128 09/01/2018   CHOLHDL 3.5 09/01/2018      Also, the patient has Morbid Obesity  (BMI 33+) & history of PreDiabetes (A1c 5.8% / 2010 and 6.1% / 2011) and has had no symptoms of reactive hypoglycemia, diabetic polys, paresthesias or visual blurring.  Last A1c was not at goal: Lab Results  Component Value Date   HGBA1C 6.0 (H) 09/01/2018      Further, the patient also has history of Vitamin D Deficiency ("36" / 2008) and supplements vitamin D without any suspected side-effects. Last vitamin D was not at goal (70-100):  Lab Results  Component Value Date   VD25OH 48 09/01/2018   Current Outpatient Medications on File Prior to Visit  Medication Sig  . aspirin 81 MG tablet Take 81 mg by mouth daily.  Marland Kitchen atorvastatin (LIPITOR) 80 MG tablet TAKE ONE TABLET EACH DAY FOR CHOLESTEROL  . Calcium Carb-Cholecalciferol (CALTRATE 600+D3 SOFT) 600-800 MG-UNIT CHEW Chew 1 Units by mouth daily.  . Cholecalciferol (VITAMIN D PO) Take 2,000 Units by mouth. Takes 10000 iu daily  . Magnesium 250 MG TABS Take by mouth daily.  . Multiple Vitamin (MULTIVITAMIN) tablet Take 1 tablet by mouth daily.     No current facility-administered medications on file prior to visit.    Allergies  Allergen Reactions  . Nitrofurantoin Monohyd Macro  Nausea Only   PMHx:   Past Medical History:  Diagnosis Date  . Cancer (HCC)    Breast  . 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 Hypercholesteremia   . Osteopenia 04/2018   T score -1.7.  Overall stable from prior DEXA  . Serous cyst of ovary    left   Immunization History  Administered Date(s) Administered  . DTaP 06/26/1999  . Influenza Split 09/05/2014, 10/04/2015  . Influenza Whole 09/01/2013  . Influenza, High Dose Seasonal PF 08/12/2017, 09/01/2018  . Influenza-Unspecified 09/09/2016  . PPD Test 03/05/2014, 03/11/2015, 03/30/2016  . Pneumococcal Conjugate-13 04/22/2017  . Pneumococcal Polysaccharide-23 02/04/2006, 09/01/2018  . Tdap 09/13/2009  . Zoster 10/04/2015   Past Surgical History:  Procedure Laterality Date  . BREAST LUMPECTOMY Left 2006  . COLONOSCOPY N/A 05/24/2013   Dr Daine Gravel  . SALPINGOOPHORECTOMY  1978   left   FHx:    Reviewed / unchanged  SHx:    Reviewed / unchanged   Systems Review:  Constitutional: Denies fever, chills, wt changes, headaches, insomnia, fatigue, night sweats, change in appetite. Eyes: Denies redness, blurred vision, diplopia, discharge, itchy, watery eyes.  ENT: Denies discharge, congestion, post nasal drip, epistaxis, sore throat, earache, hearing loss, dental pain, tinnitus, vertigo, sinus pain, snoring.  CV: Denies chest pain, palpitations, irregular heartbeat, syncope, dyspnea, diaphoresis, orthopnea, PND,  claudication or edema. Respiratory: denies cough, dyspnea, DOE, pleurisy, hoarseness, laryngitis, wheezing.  Gastrointestinal: Denies dysphagia, odynophagia, heartburn, reflux, water brash, abdominal pain or cramps, nausea, vomiting, bloating, diarrhea, constipation, hematemesis, melena, hematochezia  or hemorrhoids. Genitourinary: Denies dysuria, frequency, urgency, nocturia, hesitancy, discharge, hematuria or flank pain. Musculoskeletal: Denies arthralgias, myalgias, stiffness, jt. swelling, pain,  limping or strain/sprain.  Skin: Denies pruritus, rash, hives, warts, acne, eczema or change in skin lesion(s). Neuro: No weakness, tremor, incoordination, spasms, paresthesia or pain. Psychiatric: Denies confusion, memory loss or sensory loss. Endo: Denies change in weight, skin or hair change.  Heme/Lymph: No excessive bleeding, bruising or enlarged lymph nodes.  Physical Exam  BP 136/76   Pulse 72   Temp (!) 97.4 F (36.3 C)   Resp 16   Ht 5' 2.5" (1.588 m)   Wt 186 lb 3.2 oz (84.5 kg)   BMI 33.51 kg/m   Appears  Over nourished, well groomed  and in no distress.  Eyes: PERRLA, EOMs, conjunctiva no swelling or erythema. Sinuses: No frontal/maxillary tenderness ENT/Mouth: EAC's clear, TM's nl w/o erythema, bulging. Nares clear w/o erythema, swelling, exudates. Oropharynx clear without erythema or exudates. Oral hygiene is good. Tongue normal, non obstructing. Hearing intact.  Neck: Supple. Thyroid not palpable. Car 2+/2+ without bruits, nodes or JVD. Chest: Respirations nl with BS clear & equal w/o rales, rhonchi, wheezing or stridor.  Cor: Heart sounds normal w/ regular rate and rhythm without sig. murmurs, gallops, clicks or rubs. Peripheral pulses normal and equal  without edema.  Abdomen: Soft & bowel sounds normal. Non-tender w/o guarding, rebound, hernias, masses or organomegaly.  Lymphatics: Unremarkable.  Musculoskeletal: Full ROM all peripheral extremities, joint stability, 5/5 strength and normal gait.  Skin: Warm, dry without exposed rashes, lesions or ecchymosis apparent.  Neuro: Cranial nerves intact, reflexes equal bilaterally. Sensory-motor testing grossly intact. Tendon reflexes grossly intact.  Pysch: Alert & oriented x 3.  Insight and judgement nl & appropriate. No ideations.  Assessment and Plan:  1. Essential hypertension  - Continue medication, monitor blood pressure at home.  - Continue DASH diet.  Reminder to go to the ER if any CP,  SOB, nausea,  dizziness, severe HA, changes vision/speech.  - CBC with Differential/Platelet - COMPLETE METABOLIC PANEL WITH GFR - Magnesium - TSH  2. Hyperlipidemia, mixed  - Continue diet/meds, exercise,& lifestyle modifications.  - Continue monitor periodic cholesterol/liver & renal functions   - Lipid panel - TSH  3. Abnormal glucose  - Continue diet, exercise,  - lifestyle modifications.  - Monitor appropriate labs  - Hemoglobin A1c - Insulin, random  4. Vitamin D deficiency  - Continue supplementation.  - VITAMIN D 25 Hydroxy l  5. Prediabetes  - Hemoglobin A1c - Insulin, random  6. Medication management  - CBC with Differential/Platelet - COMPLETE METABOLIC PANEL WITH GFR - Magnesium - Lipid panel - TSH - Hemoglobin A1c - Insulin, random - VITAMIN D 25 Hydroxyl        Discussed  regular exercise, BP monitoring, weight control to achieve/maintain BMI less than 25 and discussed med and SE's. Recommended labs to assess and monitor clinical status with further disposition pending results of labs. Over 30 minutes of exam, counseling, chart review was performed.

## 2018-11-30 NOTE — Patient Instructions (Signed)

## 2018-12-01 ENCOUNTER — Encounter: Payer: Self-pay | Admitting: Internal Medicine

## 2018-12-01 ENCOUNTER — Ambulatory Visit (INDEPENDENT_AMBULATORY_CARE_PROVIDER_SITE_OTHER): Payer: Medicare Other | Admitting: Internal Medicine

## 2018-12-01 VITALS — BP 136/76 | HR 72 | Temp 97.4°F | Resp 16 | Ht 62.5 in | Wt 186.2 lb

## 2018-12-01 DIAGNOSIS — E559 Vitamin D deficiency, unspecified: Secondary | ICD-10-CM

## 2018-12-01 DIAGNOSIS — E669 Obesity, unspecified: Secondary | ICD-10-CM | POA: Diagnosis not present

## 2018-12-01 DIAGNOSIS — R7309 Other abnormal glucose: Secondary | ICD-10-CM | POA: Diagnosis not present

## 2018-12-01 DIAGNOSIS — E782 Mixed hyperlipidemia: Secondary | ICD-10-CM

## 2018-12-01 DIAGNOSIS — Z79899 Other long term (current) drug therapy: Secondary | ICD-10-CM

## 2018-12-01 DIAGNOSIS — R7303 Prediabetes: Secondary | ICD-10-CM

## 2018-12-01 DIAGNOSIS — I10 Essential (primary) hypertension: Secondary | ICD-10-CM

## 2018-12-01 MED ORDER — PHENTERMINE HCL 37.5 MG PO TABS: ORAL_TABLET | ORAL | 5 refills | Status: DC

## 2018-12-02 LAB — COMPLETE METABOLIC PANEL WITH GFR
AG Ratio: 2 (calc) (ref 1.0–2.5)
ALBUMIN MSPROF: 4.1 g/dL (ref 3.6–5.1)
ALT: 13 U/L (ref 6–29)
AST: 16 U/L (ref 10–35)
Alkaline phosphatase (APISO): 74 U/L (ref 33–130)
BUN: 19 mg/dL (ref 7–25)
CHLORIDE: 109 mmol/L (ref 98–110)
CO2: 26 mmol/L (ref 20–32)
CREATININE: 0.76 mg/dL (ref 0.50–0.99)
Calcium: 9.5 mg/dL (ref 8.6–10.4)
GFR, EST AFRICAN AMERICAN: 94 mL/min/{1.73_m2} (ref >=60)
GFR, Est Non African American: 81 mL/min/{1.73_m2} (ref >=60)
GLUCOSE: 105 mg/dL — AB (ref 65–99)
Globulin: 2.1 g/dL (calc) (ref 1.9–3.7)
Potassium: 4.1 mmol/L (ref 3.5–5.3)
Sodium: 142 mmol/L (ref 135–146)
TOTAL PROTEIN: 6.2 g/dL (ref 6.1–8.1)
Total Bilirubin: 0.4 mg/dL (ref 0.2–1.2)

## 2018-12-02 LAB — CBC WITH DIFFERENTIAL/PLATELET
Absolute Monocytes: 462 cells/uL (ref 200–950)
BASOS PCT: 0.3 %
Basophils Absolute: 21 cells/uL (ref 0–200)
Eosinophils Absolute: 91 cells/uL (ref 15–500)
Eosinophils Relative: 1.3 %
HCT: 37.9 % (ref 35.0–45.0)
HEMOGLOBIN: 13.3 g/dL (ref 11.7–15.5)
Lymphs Abs: 2086 cells/uL (ref 850–3900)
MCH: 31.7 pg (ref 27.0–33.0)
MCHC: 35.1 g/dL (ref 32.0–36.0)
MCV: 90.5 fL (ref 80.0–100.0)
MONOS PCT: 6.6 %
MPV: 10.6 fL (ref 7.5–12.5)
NEUTROS ABS: 4340 {cells}/uL (ref 1500–7800)
Neutrophils Relative %: 62 %
PLATELETS: 194 10*3/uL (ref 140–400)
RBC: 4.19 10*6/uL (ref 3.80–5.10)
RDW: 13.1 % (ref 11.0–15.0)
TOTAL LYMPHOCYTE: 29.8 %
WBC: 7 10*3/uL (ref 3.8–10.8)

## 2018-12-02 LAB — VITAMIN D 25 HYDROXY (VIT D DEFICIENCY, FRACTURES): VIT D 25 HYDROXY: 41 ng/mL (ref 30–100)

## 2018-12-02 LAB — TSH: TSH: 1.62 m[IU]/L (ref 0.40–4.50)

## 2018-12-02 LAB — HEMOGLOBIN A1C
EAG (MMOL/L): 6.8 (calc)
Hgb A1c MFr Bld: 5.9 % of total Hgb — ABNORMAL HIGH (ref <5.7)
MEAN PLASMA GLUCOSE: 123 (calc)

## 2018-12-02 LAB — LIPID PANEL
Cholesterol: 214 mg/dL — ABNORMAL HIGH (ref <200)
HDL: 51 mg/dL (ref >50)
LDL CHOLESTEROL (CALC): 143 mg/dL — AB
Non-HDL Cholesterol (Calc): 163 mg/dL (calc) — ABNORMAL HIGH (ref <130)
Total CHOL/HDL Ratio: 4.2 (calc) (ref <5.0)
Triglycerides: 93 mg/dL (ref <150)

## 2018-12-02 LAB — INSULIN, RANDOM: INSULIN: 8.3 u[IU]/mL (ref 2.0–19.6)

## 2018-12-02 LAB — MAGNESIUM: MAGNESIUM: 1.7 mg/dL (ref 1.5–2.5)

## 2018-12-05 ENCOUNTER — Ambulatory Visit
Admission: RE | Admit: 2018-12-05 | Discharge: 2018-12-05 | Disposition: A | Discharge status: Home / Self Care | Payer: Medicare Other | Source: Ambulatory Visit | Attending: Gynecology | Admitting: Gynecology

## 2018-12-05 DIAGNOSIS — Z1231 Encounter for screening mammogram for malignant neoplasm of breast: Secondary | ICD-10-CM | POA: Diagnosis not present

## 2019-02-23 ENCOUNTER — Other Ambulatory Visit: Payer: Self-pay

## 2019-02-27 ENCOUNTER — Encounter: Payer: Self-pay | Admitting: Gynecology

## 2019-02-27 ENCOUNTER — Other Ambulatory Visit: Payer: Self-pay

## 2019-02-27 ENCOUNTER — Ambulatory Visit (INDEPENDENT_AMBULATORY_CARE_PROVIDER_SITE_OTHER): Payer: Medicare Other | Admitting: Gynecology

## 2019-02-27 VITALS — BP 124/82 | Ht 62.0 in | Wt 171.0 lb

## 2019-02-27 DIAGNOSIS — M8580 Other specified disorders of bone density and structure, unspecified site: Secondary | ICD-10-CM

## 2019-02-27 DIAGNOSIS — N952 Postmenopausal atrophic vaginitis: Secondary | ICD-10-CM | POA: Diagnosis not present

## 2019-02-27 DIAGNOSIS — Z01419 Encounter for gynecological examination (general) (routine) without abnormal findings: Secondary | ICD-10-CM | POA: Diagnosis not present

## 2019-02-27 NOTE — Patient Instructions (Signed)
Follow-up in 1 year for annual exam, sooner if any issues. 

## 2019-02-27 NOTE — Progress Notes (Signed)
Shawna [name] 1951/07/04 [id number]        68 y.o.  G0P0000 for breast and pelvic exam  Past medical history,surgical history, problem list, medications, allergies, family history and social history were all reviewed and documented as reviewed in the EPIC chart.  ROS:  Performed with pertinent positives and negatives included in the history, assessment and plan.   Additional significant findings : None   Exam: Kennon Portela assistant Vitals:   02/27/19 1204  BP: 124/82  Weight: 171 lb (77.6 kg)  Height: 5\' 2"  (1.575 m)   Body mass index is 31.28 kg/m.  General appearance:  Normal affect, orientation and appearance. Skin: Grossly normal HEENT: Without gross lesions.  No cervical or supraclavicular adenopathy. Thyroid normal.  Lungs:  Clear without wheezing, rales or rhonchi Cardiac: RR, without RMG Abdominal:  Soft, nontender, without masses, guarding, rebound, organomegaly or hernia Breasts:  Examined lying and sitting without masses, retractions, discharge or axillary adenopathy.  Well-healed left lumpectomy scar. Pelvic:  Ext, BUS, Vagina: With atrophic changes  Cervix: With atrophic changes  Uterus: Anteverted, normal size, shape and contour, midline and mobile nontender   Adnexa: Without masses or tenderness    Anus and perineum: Normal   Rectovaginal: Normal sphincter tone without palpated masses or tenderness.    Assessment/Plan:  68 y.o. G0P0000 female for breast and pelvic exam  1. Postmenopausal.  No significant menopausal symptoms or any vaginal bleeding. 2. Pap smear 02/2017.  No Pap smear done today.  No history of abnormal Pap smears.  Options to stop screening per current screening guidelines reviewed versus repeat next year at 3-year interval discussed. 3. History of left breast DCIS.  Mammography 11/2018.  Continue with annual mammography when due.  Breast exam normal today.  SBE monthly reviewed. 4. Osteopenia.  DEXA 2019 T score -1.7 stable from prior  DEXA.  Plan repeat DEXA next year at 2-year interval. 5. Colonoscopy 2019.  Repeat at their recommended interval. 6. Health maintenance.  No routine lab work done as patient does this elsewhere.  Follow-up 1 year, sooner as needed.   Dara Lords MD, 12:38 PM 02/27/2019

## 2019-03-02 ENCOUNTER — Ambulatory Visit: Payer: Self-pay | Admitting: Physician Assistant

## 2019-03-15 ENCOUNTER — Other Ambulatory Visit: Payer: Self-pay

## 2019-03-15 ENCOUNTER — Ambulatory Visit: Payer: Medicare Other | Admitting: Internal Medicine

## 2019-03-15 ENCOUNTER — Encounter: Payer: Self-pay | Admitting: Internal Medicine

## 2019-03-15 VITALS — Wt 164.0 lb

## 2019-03-15 DIAGNOSIS — J301 Allergic rhinitis due to pollen: Secondary | ICD-10-CM

## 2019-03-15 MED ORDER — PREDNISONE 20 MG PO TABS: ORAL_TABLET | ORAL | 0 refills | Status: DC

## 2019-03-15 MED ORDER — MONTELUKAST SODIUM 10 MG PO TABS: ORAL_TABLET | ORAL | 3 refills | Status: DC

## 2019-03-15 NOTE — Patient Instructions (Signed)

## 2019-03-15 NOTE — Progress Notes (Signed)
THIS ENCOUNTER IS A VIRTUAL VISIT DUE TO COVID-19 - PATIENT WAS NOT SEEN IN THE OFFICE.  PATIENT HAS CONSENTED TO VIRTUAL VISIT / TELEMEDICINE VISIT  Virtual Visit via telephone Note  I connected with Shawna [name] on 03/15/19  by telephone at 1:45 pm.  I verified that I am speaking with the correct person using two identifiers.    I discussed the limitations of evaluation and management by telemedicine and the availability of in person appointments. The patient expressed understanding and agreed to proceed.  History of Present Illness:   Patient c/o 1 & 1/2 week prodrome of seasonal allergy sx's with recent increased pollen and sx's of clear post nasal drainage, itchy sore throat,itchy watery eyes & nose, cough with scant amounts of clear thin sputum. Denies fevers. Chills, rash dyspnea. Tried & intolerant to Zyrtec with sedation. Has used Allegra with some symptom relief.  Medications  .  atorvastatin (LIPITOR) 80 MG tablet, TAKE ONE TABLET EACH DAY FOR CHOLESTEROL .  aspirin 81 MG tablet, Take 81 mg by mouth daily. .  Calcium Carb-Cholecalciferol (CALTRATE 600+D3 SOFT) 600-800 MG-UNIT CHEW, Chew 1 Units by mouth daily. .  Cholecalciferol (VITAMIN D PO), Take 2,000 Units by mouth. Takes 10000 iu daily .  Magnesium 250 MG TABS, Take by mouth daily. .  Multiple Vitamin (MULTIVITAMIN) tablet, Take 1 tablet by mouth daily.   .  phentermine (ADIPEX-P) 37.5 MG tablet, Take 1/2 to 1 tablet every morning for dieting & weight  loss  Problem list  She has DCIS (ductal carcinoma in situ) of breast, Left lumpectomy. 09/23/2005.; Essential hypertension; Hyperlipidemia; Prediabetes; Vitamin D deficiency; Medication management; and Obesity (BMI 30.0-34.9) on their problem list.   Observations/Objective:  General : Well sounding patient in no apparent distress HEENT: no hoarseness, no cough for duration of visit Lungs: speaks in complete sentences, no audible wheezing, no apparent  distress Neurological: alert, oriented x 3 Psychiatric: pleasant, judgement appropriate   Assessment and Plan:  1. Seasonal allergic rhinitis due to pollen  - montelukast 10 MG tablet; Take 1 tablet daily for Allergies  Disp: 90 tablet; Refill: 3  - predniSONE 20 MG tablet; 1 tab 3 x day for 3 days, then 1 tab 2 x day for 3 days, then 1 tab 1 x day for 5 days  Dispense: 20 tablet; Refill: 0  Follow Up Instructions:     I discussed the assessment and treatment plan with the patient. The patient was provided an opportunity to ask questions and all were answered. The patient agreed with the plan and demonstrated an understanding of the instructions. The patient was advised to call back or seek an in-person evaluation if the symptoms worsen or if the condition fails to improve as anticipated.  I provided 18 minutes of non-face-to-face time during this encounter and  over 23 minutes of counseling, chart review and critical decision making was performed    Kirtland Bouchard, MD

## 2019-04-05 NOTE — Progress Notes (Signed)
Assessment and Plan:  Shawna [name] was seen today for follow-up.  Diagnoses and all orders for this visit:  Essential hypertension - continue medications, DASH diet, exercise and monitor at home. Call if greater than 130/80.  -     CBC with Differential/Platelet -     COMPLETE METABOLIC PANEL WITH GFR -     TSH  DCIS (ductal carcinoma in situ) of breast, Left lumpectomy. 09/23/2005. Monitor  Hyperlipidemia check lipids decrease fatty foods increase activity.  SHE IS TAKING IT MORE CONSISTENTLY -     Lipid panel  Abnormal glucose -     Hemoglobin A1c Discussed disease progression and risks Discussed diet/exercise, weight management and risk modification  Vitamin D deficiency Continue supplement\  Medication management -     Magnesium  Plantar's wart right foot-  will try OTC treatment, if not better will schedule OV here for removal or refer to podiatry   Continue diet and meds as discussed. Further disposition pending results of labs. Future Appointments  Date Time Provider [place]  06/29/2019 11:00 AM Unk Pinto, MD [facility] None  09/11/2019  2:00 PM Liane Comber, NP [facility] None     HPI 68 y.o. female  presents for 3 month follow up with hypertension, hyperlipidemia, prediabetes and vitamin D. Her blood pressure has been controlled at home, today their BP is BP: 136/74 She does workout. She denies chest pain, shortness of breath, dizziness.  She is on cholesterol medication, lipitor 80mg  1/2 pill 4 days a week. STATES SHE IS TAKING THEM MORE REGULARLY and denies myalgias. Her cholesterol is at goal. The cholesterol last visit was:   Lab Results  Component Value Date   CHOL 214 (H) 12/01/2018   HDL 51 12/01/2018   LDLCALC 143 (H) 12/01/2018   TRIG 93 12/01/2018   CHOLHDL 4.2 12/01/2018   She has been working on diet and exercise for prediabetes, and denies paresthesia of the feet, polydipsia, polyuria and visual disturbances. On bASA. Last A1C  in the office was:  Lab Results  Component Value Date   HGBA1C 5.9 (H) 12/01/2018   Patient is on Vitamin D supplement, 8000 IU daily.   Lab Results  Component Value Date   VD25OH 41 12/01/2018   BMI is Body mass index is 31.28 kg/m., she is working on diet and exercise. Wt Readings from Last 3 Encounters:  04/06/19 171 lb (77.6 kg)  03/15/19 164 lb (74.4 kg)  02/27/19 171 lb (77.6 kg)   Current Medications:  Current Outpatient Medications on File Prior to Visit  Medication Sig Dispense Refill  . aspirin 81 MG tablet Take 81 mg by mouth daily.    Marland Kitchen atorvastatin (LIPITOR) 80 MG tablet TAKE ONE TABLET EACH DAY FOR CHOLESTEROL 90 tablet 1  . Calcium Carb-Cholecalciferol (CALTRATE 600+D3 SOFT) 600-800 MG-UNIT CHEW Chew 1 Units by mouth daily.    . Cholecalciferol (VITAMIN D PO) Take 2,000 Units by mouth. Takes 10000 iu daily    . Magnesium 250 MG TABS Take by mouth daily.    . montelukast (SINGULAIR) 10 MG tablet Take 1 tablet daily for Allergies 90 tablet 3  . Multiple Vitamin (MULTIVITAMIN) tablet Take 1 tablet by mouth daily.      . phentermine (ADIPEX-P) 37.5 MG tablet Take 37.5 mg by mouth daily before breakfast.     No current facility-administered medications on file prior to visit.    Medical History:  Past Medical History:  Diagnosis Date  . Cancer ([place])    Breast  .  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 Hypercholesteremia   . Osteopenia 04/2018   T score -1.7.  Overall stable from prior DEXA  . Serous cyst of ovary    left   Allergies:  Allergies  Allergen Reactions  . Nitrofurantoin Monohyd Macro Nausea Only    Review of Systems:  Review of Systems  Constitutional: Negative.   HENT: Negative.   Eyes: Negative.   Respiratory: Negative.   Cardiovascular: Negative.   Gastrointestinal: Negative.   Genitourinary: Negative.   Musculoskeletal: Negative.   Skin: Negative.   Neurological: Negative.   Endo/Heme/Allergies: Negative.    Psychiatric/Behavioral: Negative.     Family history- Review and unchanged Social history- Review and unchanged Physical Exam: BP 136/74   Pulse 89   Temp 97.6 F (36.4 C)   Ht 5\' 2"  (1.575 m)   Wt 171 lb (77.6 kg)   SpO2 98%   BMI 31.28 kg/m  Wt Readings from Last 3 Encounters:  04/06/19 171 lb (77.6 kg)  03/15/19 164 lb (74.4 kg)  02/27/19 171 lb (77.6 kg)   General Appearance: Well nourished, in no apparent distress. Eyes: PERRLA, EOMs, conjunctiva no swelling or erythema Sinuses: No Frontal/maxillary tenderness ENT/Mouth: Ext aud canals clear, TMs without erythema, bulging. No erythema, swelling, or exudate on post pharynx.  Tonsils not swollen or erythematous. Hearing normal.  Neck: Supple, thyroid normal.  Respiratory: Respiratory effort normal, BS equal bilaterally without rales, rhonchi, wheezing or stridor.  Cardio: RRR with no MRGs. Brisk peripheral pulses without edema.  Abdomen: Soft, + BS.  Non tender, no guarding, rebound, hernias, masses. Lymphatics: Non tender without lymphadenopathy.  Musculoskeletal: Full ROM, 5/5 strength, normal gait.  Skin: Planters wart on right foot. Warm, dry without rashes, lesions, ecchymosis.  Neuro: Cranial nerves intact. Normal muscle tone, no cerebellar symptoms. Sensation intact.  Psych: Awake and oriented X 3, normal affect, Insight and Judgment appropriate.    Vicie Mutters, PA-C 2:34 PM [facility]

## 2019-04-06 ENCOUNTER — Encounter: Payer: Self-pay | Admitting: Physician Assistant

## 2019-04-06 ENCOUNTER — Other Ambulatory Visit: Payer: Self-pay

## 2019-04-06 ENCOUNTER — Ambulatory Visit (INDEPENDENT_AMBULATORY_CARE_PROVIDER_SITE_OTHER): Payer: Medicare Other | Admitting: Physician Assistant

## 2019-04-06 VITALS — BP 136/74 | HR 89 | Temp 97.6°F | Ht 62.0 in | Wt 171.0 lb

## 2019-04-06 DIAGNOSIS — Z79899 Other long term (current) drug therapy: Secondary | ICD-10-CM | POA: Diagnosis not present

## 2019-04-06 DIAGNOSIS — Z86000 Personal history of in-situ neoplasm of breast: Secondary | ICD-10-CM | POA: Diagnosis not present

## 2019-04-06 DIAGNOSIS — I10 Essential (primary) hypertension: Secondary | ICD-10-CM

## 2019-04-06 DIAGNOSIS — E559 Vitamin D deficiency, unspecified: Secondary | ICD-10-CM | POA: Diagnosis not present

## 2019-04-06 DIAGNOSIS — R7309 Other abnormal glucose: Secondary | ICD-10-CM | POA: Diagnosis not present

## 2019-04-06 DIAGNOSIS — E782 Mixed hyperlipidemia: Secondary | ICD-10-CM | POA: Diagnosis not present

## 2019-04-06 NOTE — Patient Instructions (Addendum)
Your LDL could improve, ideally we want it under a 100.  Your LDL is the bad cholesterol that can lead to heart attack and stroke. To lower your number you can decrease your fatty foods, red meat, cheese, milk and increase fiber like whole grains and veggies. You can also add a fiber supplement like Citracel or Benefiber, these do not cause gas and bloating and are safe to use. Especially if you have a strong family history of heart disease or stroke or you have evidence of plaque on any imaging like a chest xray, we may discuss at your next office visit putting you on a medication to get your number below 100.   Lab Results  Component Value Date   CHOL 214 (H) 12/01/2018   HDL 51 12/01/2018   LDLCALC 143 (H) 12/01/2018   TRIG 93 12/01/2018   CHOLHDL 4.2 12/01/2018    Plantar Warts Warts are small growths on the skin. When they occur on the underside (sole) of the foot, they are called plantar warts. Plantar warts often occur in groups, with several small warts around a larger wart. They tend to develop on the heel or the ball of the foot. They may grow into the deeper layers of skin or rise above the surface of the skin. Most warts are not painful, and they usually do not cause problems. However, plantar warts may cause pain when you walk because pressure is applied to them. Plantar warts may spread to other areas of the sole. They can also spread to other areas of the body through direct and indirect contact. Warts often go away on their own in time. Various treatments may be done if needed or desired. What are the causes? Plantar warts are caused by a type of virus that is called human papillomavirus (HPV).  Walking barefoot can cause exposure to the virus, especially if your feet are wet.  HPV attacks a break in the skin of the foot. What increases the risk? You are more likely to develop this condition if you:  Have a weakened body defense system (immune system). What are the signs or  symptoms? Common symptoms of this condition include:  Flat or slightly raised growths that have a rough surface and look similar to a callus.  Pain when you use your foot to support your body weight. How is this diagnosed? A plantar wart can usually be diagnosed from its appearance. In some cases, a tissue sample may be removed (biopsy) to be looked at under a microscope. How is this treated? In many cases, warts do not need treatment. Without treatment, they often go away with time. If treatment is needed or desired, options may include:  Applying medicated solutions, creams, or patches to the wart. These may be over-the-counter or prescription medicines that make the skin soft so that layers will gradually shed away. In many cases, the medicine is applied one or two times a day and covered with a bandage.  Freezing the wart with liquid nitrogen (cryotherapy).  Burning the wart with: ? Laser treatment. ? An electrified probe (electrocautery).  Injecting a medicine (Candida antigen) into the wart to help the body's immune system fight off the wart.  Having surgery to remove the wart.  Putting duct tape over the top of the wart (occlusion). You will leave the tape in place for as long as told by your health care provider, and then you will replace it with a new strip of tape. This is done until  the wart goes away. Repeat treatment may be needed if you choose to remove warts. Warts sometimes go away and come back again. Follow these instructions at home:  Apply medicated creams or solutions only as told by your health care provider. This may involve: ? Soaking the affected area in warm water. ? Removing the top layer of softened skin before you apply the medicine. A pumice stone works well for removing the skin. ? Applying a bandage over the affected area after you apply the medicine. ? Repeating the process daily or as told by your health care provider.  Do not scratch or pick at a  wart.  Wash your hands after you touch a wart.  If a wart is painful, try covering it with a bandage that has a hole in the middle. This helps to take pressure off the wart.  Keep all follow-up visits as told by your health care provider. This is important. How is this prevented? Take these actions to help prevent warts:  Wear shoes and socks. Change your socks daily.  Keep your feet clean and dry.  Do not walk barefoot in shared locker rooms, shower areas, or swimming pools.  Check your feet regularly.  Avoid direct contact with warts on other people. Contact a health care provider if:  Your warts do not improve after treatment.  You have redness, swelling, or pain at the site of a wart.  You have bleeding from a wart that does not stop with light pressure.  You have diabetes and you develop a wart. Summary  Warts are small growths on the skin. When they occur on the underside (sole) of the foot, they are called plantar warts.  In many cases, warts do not need treatment. Without treatment, they often go away with time.  Apply medicated creams or solutions only as told by your health care provider.  Do not scratch or pick at a wart. Wash your hands after you touch a wart.  Keep all follow-up visits as told by your health care provider. This is important. This information is not intended to replace advice given to you by your health care provider. Make sure you discuss any questions you have with your health care provider. Document Released: 02/13/2004 Document Revised: 06/21/2018 Document Reviewed: 06/21/2018 Elsevier Interactive Patient Education  2019 [employer].

## 2019-04-07 LAB — CBC WITH DIFFERENTIAL/PLATELET
Absolute Monocytes: 521 cells/uL (ref 200–950)
Basophils Absolute: 32 cells/uL (ref 0–200)
Basophils Relative: 0.4 %
Eosinophils Absolute: 126 cells/uL (ref 15–500)
Eosinophils Relative: 1.6 %
HCT: 37.6 % (ref 35.0–45.0)
Hemoglobin: 12.9 g/dL (ref 11.7–15.5)
Lymphs Abs: 2259 cells/uL (ref 850–3900)
MCH: 30.6 pg (ref 27.0–33.0)
MCHC: 34.3 g/dL (ref 32.0–36.0)
MCV: 89.1 fL (ref 80.0–100.0)
MPV: 10.1 fL (ref 7.5–12.5)
Monocytes Relative: 6.6 %
Neutro Abs: 4961 cells/uL (ref 1500–7800)
Neutrophils Relative %: 62.8 %
Platelets: 189 10*3/uL (ref 140–400)
RBC: 4.22 10*6/uL (ref 3.80–5.10)
RDW: 13 % (ref 11.0–15.0)
Total Lymphocyte: 28.6 %
WBC: 7.9 10*3/uL (ref 3.8–10.8)

## 2019-04-07 LAB — LIPID PANEL
Cholesterol: 191 mg/dL (ref <200)
HDL: 52 mg/dL (ref >=50)
LDL Cholesterol (Calc): 119 mg/dL (calc) — ABNORMAL HIGH
Non-HDL Cholesterol (Calc): 139 mg/dL (calc) — ABNORMAL HIGH (ref <130)
Total CHOL/HDL Ratio: 3.7 (calc) (ref <5.0)
Triglycerides: 92 mg/dL (ref <150)

## 2019-04-07 LAB — COMPLETE METABOLIC PANEL WITH GFR
AG Ratio: 1.8 (calc) (ref 1.0–2.5)
ALT: 18 U/L (ref 6–29)
AST: 20 U/L (ref 10–35)
Albumin: 3.9 g/dL (ref 3.6–5.1)
Alkaline phosphatase (APISO): 77 U/L (ref 37–153)
BUN: 18 mg/dL (ref 7–25)
CO2: 24 mmol/L (ref 20–32)
Calcium: 8.9 mg/dL (ref 8.6–10.4)
Chloride: 105 mmol/L (ref 98–110)
Creat: 0.84 mg/dL (ref 0.50–0.99)
GFR, Est African American: 83 mL/min/{1.73_m2} (ref >=60)
GFR, Est Non African American: 72 mL/min/{1.73_m2} (ref >=60)
Globulin: 2.2 g/dL (calc) (ref 1.9–3.7)
Glucose, Bld: 78 mg/dL (ref 65–99)
Potassium: 3.9 mmol/L (ref 3.5–5.3)
Sodium: 140 mmol/L (ref 135–146)
Total Bilirubin: 0.5 mg/dL (ref 0.2–1.2)
Total Protein: 6.1 g/dL (ref 6.1–8.1)

## 2019-04-07 LAB — HEMOGLOBIN A1C
Hgb A1c MFr Bld: 6.1 % of total Hgb — ABNORMAL HIGH (ref <5.7)
Mean Plasma Glucose: 128 (calc)
eAG (mmol/L): 7.1 (calc)

## 2019-04-07 LAB — TSH: TSH: 1.19 mIU/L (ref 0.40–4.50)

## 2019-04-07 LAB — MAGNESIUM: Magnesium: 1.9 mg/dL (ref 1.5–2.5)

## 2019-05-11 ENCOUNTER — Other Ambulatory Visit: Payer: Self-pay | Admitting: Internal Medicine

## 2019-06-06 ENCOUNTER — Encounter: Payer: Self-pay | Admitting: Internal Medicine

## 2019-06-22 ENCOUNTER — Other Ambulatory Visit: Payer: Self-pay | Admitting: Internal Medicine

## 2019-06-29 ENCOUNTER — Encounter: Payer: Self-pay | Admitting: Internal Medicine

## 2019-07-10 NOTE — Progress Notes (Signed)
CPE AND 3 MONTH  Assessment:    Encounter for general adult medical examination with abnormal findings 1 year  Essential hypertension - continue medications, DASH diet, exercise and monitor at home. Call if greater than 130/80.  -     CBC with Differential/Platelet -     COMPLETE METABOLIC PANEL WITH GFR -     TSH -     Urinalysis, Routine w reflex microscopic -     Microalbumin / creatinine urine ratio -     EKG 12-Lead  Hyperlipidemia check lipids decrease fatty foods increase activity.  -     Lipid panel  Abnormal glucose Discussed disease progression and risks Discussed diet/exercise, weight management and risk modification -     Hemoglobin A1c  Medication management -     Magnesium  Vitamin D deficiency -     VITAMIN D 25 Hydroxy (Vit-D Deficiency, Fractures)  Obesity (BMI 30.0-34.9) - follow up 3 months for progress monitoring - increase veggies, decrease carbs - long discussion about weight loss, diet, and exercise  DCIS (ductal carcinoma in situ) of breast, Left lumpectomy. 09/23/2005. Continue yearly MGM  Over 40 minutes of exam, counseling, chart review and critical decision making was performed Future Appointments  Date Time Provider [place]  10/16/2019 10:00 AM Liane Comber, NP [facility] None  07/15/2020 10:00 AM Vicie Mutters, PA-C [facility] None     Subjective:  Shawna [name] is a 68 y.o. female who presents for CPE and OV.     BMI is Body mass index is 30.95 kg/m., she has not been working on diet and exercise. She has been on phetnermine 1/2 pill daily.  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 She denies chest pain, shortness of breath, dizziness. Patient is  s/p lumpectomy of L breast DCIS in 2006. Last [facility] 11/2018.   She is on cholesterol medication, lipitor 80 mg 1/2 pill due to size, she is on it 4 days a week and denies myalgias. Her cholesterol is at goal. The cholesterol last visit was:   Lab Results  Component Value Date   CHOL 191 04/06/2019   HDL 52 04/06/2019   LDLCALC 119 (H) 04/06/2019   TRIG 92 04/06/2019   CHOLHDL 3.7 04/06/2019    She has been working on diet and exercise for prediabetes, and denies paresthesia of the feet, polydipsia, polyuria and visual disturbances. Last A1C in the office was:  Lab Results  Component Value Date   HGBA1C 6.1 (H) 04/06/2019   Last GFR: Lab Results  Component Value Date   GFRNONAA 72 04/06/2019   Patient is on Vitamin D supplement.   Lab Results  Component Value Date   VD25OH 41 12/01/2018       Medication Review: Current Outpatient Medications on File Prior to Visit  Medication Sig Dispense Refill  . aspirin 81 MG tablet Take 81 mg by mouth daily.    Marland Kitchen atorvastatin (LIPITOR) 80 MG tablet TAKE ONE TABLET EACH DAY FOR CHOLESTEROL 90 tablet 1  . Calcium Carb-Cholecalciferol (CALTRATE 600+D3 SOFT) 600-800 MG-UNIT CHEW Chew 1 Units by mouth daily.    . Cholecalciferol (VITAMIN D PO) Take 2,000 Units by mouth.  Takes 10000 iu daily    . Magnesium 250 MG TABS Take by mouth daily.    . Multiple Vitamin (MULTIVITAMIN) tablet Take 1 tablet by mouth daily.      . phentermine (ADIPEX-P) 37.5 MG tablet TAKE ONE-HALF TO ONE TABLET EVERY MORNING FOR DIETING AND WEIGHT LOSS 30 tablet 2   No current facility-administered medications on file prior to visit.     Allergies  Allergen Reactions  . Nitrofurantoin Monohyd Macro Nausea Only    Current Problems (verified) Patient Active Problem List   Diagnosis Date Noted  . Medication management 09/05/2014  . Obesity (BMI 30.0-34.9) 09/05/2014  . Essential hypertension 11/24/2013  . Hyperlipidemia 11/24/2013  . Abnormal glucose 11/24/2013  .  Vitamin D deficiency 11/24/2013  . DCIS (ductal carcinoma in situ) of breast, Left lumpectomy. 09/23/2005.     Screening Tests Immunization History  Administered Date(s) Administered  . DTaP 06/26/1999  . Influenza Split 09/05/2014, 10/04/2015  . Influenza Whole 09/01/2013  . Influenza, High Dose Seasonal PF 08/12/2017, 09/01/2018  . Influenza-Unspecified 09/09/2016  . PPD Test 03/05/2014, 03/11/2015, 03/30/2016  . Pneumococcal Conjugate-13 04/22/2017  . Pneumococcal Polysaccharide-23 02/04/2006, 09/01/2018  . Tdap 09/13/2009  . Zoster 10/04/2015   Preventative care: Last colonoscopy: 06/17/2018, q 5 years, dad with colon cancer, Dr. Oletta Lamas Last mammogram: 11/2018 Last pap smear/pelvic exam: 02/2017 follows Dr. Phineas Real DEXA: 04/2018 - osteopenia Xray Cervical spine 2014  Prior vaccinations: TD or Tdap: 2010  Influenza: 2019  Pneumococcal: 2019 Prevnar13: 2018 Shingles/Zostavax: 2016  Names of Other Physician/Practitioners you currently use: 1. [facility] here for primary care 2. Dr. Delman Cheadle, eye doctor, last visit 2018, has upcoming appointment 3. Dr. Felipa Eth, dentist, last visit 2019, q 6 months  Patient Care Team: Unk Pinto, MD as PCP - General (Internal Medicine)  SURGICAL HISTORY She  has a past surgical history that includes Salpingoophorectomy (1978); Breast lumpectomy (Left, 2006); and Colonoscopy (N/A, 05/24/2013). FAMILY HISTORY Her family history includes Breast cancer in her paternal grandmother; Cancer in her sister; Colon cancer in her father; Diabetes in her sister; Heart disease in her sister; Hypertension in her mother. SOCIAL HISTORY She  reports that she has never smoked. She has never used smokeless tobacco. She reports that she does not drink alcohol or use drugs.  Review of Systems  Constitutional: Negative.   HENT: Negative.   Eyes: Negative.   Respiratory: Negative.   Cardiovascular: Negative.    Gastrointestinal: Negative.   Genitourinary: Negative.   Musculoskeletal: Negative.   Skin: Negative.   Neurological: Negative.   Endo/Heme/Allergies: Negative.   Psychiatric/Behavioral: Negative.      Objective:     Today's Vitals   07/13/19 1010  BP: 140/80  Pulse: 80  Temp: (!) 97.3 F (36.3 C)  SpO2: 98%  Weight: 169 lb 3.2 oz (76.7 kg)  Height: 5\' 2"  (1.575 m)   Body mass index is 30.95 kg/m.  General appearance: alert, no distress, WD/WN, female HEENT: normocephalic, sclerae anicteric, TMs pearly, nares patent, no discharge or erythema, pharynx normal Oral cavity: MMM, no lesions Neck: supple, no lymphadenopathy, no thyromegaly, no masses Heart: RRR, normal S1, S2, no murmurs Lungs: CTA bilaterally, no wheezes, rhonchi, or rales Abdomen: +bs, soft, non tender, non distended, no masses, no hepatomegaly, no splenomegaly Musculoskeletal: nontender, no swelling, no obvious deformity Extremities: no edema, no cyanosis, no clubbing Pulses: 2+ symmetric, upper and lower extremities, normal cap refill Neurological: alert, oriented x 3, CN2-12 intact, strength normal upper extremities and  lower extremities, sensation normal throughout, DTRs 2+ throughout, no cerebellar signs, gait normal Psychiatric: normal affect, behavior normal, pleasant     Vicie Mutters, PA-C   07/13/2019

## 2019-07-13 ENCOUNTER — Other Ambulatory Visit: Payer: Self-pay

## 2019-07-13 ENCOUNTER — Ambulatory Visit (INDEPENDENT_AMBULATORY_CARE_PROVIDER_SITE_OTHER): Payer: Medicare Other | Admitting: Physician Assistant

## 2019-07-13 ENCOUNTER — Encounter: Payer: Self-pay | Admitting: Physician Assistant

## 2019-07-13 VITALS — BP 140/80 | HR 80 | Temp 97.3°F | Ht 62.0 in | Wt 169.2 lb

## 2019-07-13 DIAGNOSIS — Z136 Encounter for screening for cardiovascular disorders: Secondary | ICD-10-CM

## 2019-07-13 DIAGNOSIS — E669 Obesity, unspecified: Secondary | ICD-10-CM

## 2019-07-13 DIAGNOSIS — R7309 Other abnormal glucose: Secondary | ICD-10-CM

## 2019-07-13 DIAGNOSIS — E782 Mixed hyperlipidemia: Secondary | ICD-10-CM

## 2019-07-13 DIAGNOSIS — E559 Vitamin D deficiency, unspecified: Secondary | ICD-10-CM

## 2019-07-13 DIAGNOSIS — Z86000 Personal history of in-situ neoplasm of breast: Secondary | ICD-10-CM

## 2019-07-13 DIAGNOSIS — Z0001 Encounter for general adult medical examination with abnormal findings: Secondary | ICD-10-CM

## 2019-07-13 DIAGNOSIS — I10 Essential (primary) hypertension: Secondary | ICD-10-CM | POA: Diagnosis not present

## 2019-07-13 DIAGNOSIS — Z79899 Other long term (current) drug therapy: Secondary | ICD-10-CM | POA: Diagnosis not present

## 2019-07-13 NOTE — Patient Instructions (Addendum)
Chronic Venous Insufficiency Chronic venous insufficiency is a condition where the leg veins cannot effectively pump blood from the legs to the heart. This happens when the vein walls are either stretched, weakened, or damaged, or when the valves inside the vein are damaged. With the right treatment, you should be able to continue with an active life. This condition is also called venous stasis. What are the causes? Common causes of this condition include:  High blood pressure inside the veins (venous hypertension).  Sitting or standing too long, causing increased blood pressure in the leg veins.  A blood clot that blocks blood flow in a vein (deep vein thrombosis, DVT).  Inflammation of a vein (phlebitis) that causes a blood clot to form.  Tumors in the pelvis that cause blood to back up. What increases the risk? The following factors may make you more likely to develop this condition:  Having a family history of this condition.  Obesity.  Pregnancy.  Living without enough regular physical activity or exercise (sedentary lifestyle).  Smoking.  Having a job that requires long periods of standing or sitting in one place.  Being a certain age. Women in their 40s and 50s and men in their 70s are more likely to develop this condition. What are the signs or symptoms? Symptoms of this condition include:  Veins that are enlarged, bulging, or twisted (varicose veins).  Skin breakdown or ulcers.  Reddened skin or dark discoloration of skin on the leg between the knee and ankle.  Brown, smooth, tight, and painful skin just above the ankle, usually on the inside of the leg (lipodermatosclerosis).  Swelling of the legs. How is this diagnosed? This condition may be diagnosed based on:  Your medical history.  A physical exam.  Tests, such as: ? A procedure that creates an image of a blood vessel and nearby organs and provides information about blood flow through the blood vessel  (duplex ultrasound). ? A procedure that tests blood flow (plethysmography). ? A procedure that looks at the veins using X-ray and dye (venogram). How is this treated? The goals of treatment are to help you return to an active life and to minimize pain or disability. Treatment depends on the severity of your condition, and it may include:  Wearing compression stockings. These can help relieve symptoms and help prevent your condition from getting worse. However, they do not cure the condition.  Sclerotherapy. This procedure involves an injection of a solution that shrinks damaged veins.  Surgery. This may involve: ? Removing a diseased vein (vein stripping). ? Cutting off blood flow through the vein (laser ablation surgery). ? Repairing or reconstructing a valve within the affected vein. Follow these instructions at home:      Wear compression stockings as told by your health care provider. These stockings help to prevent blood clots and reduce swelling in your legs.  Take over-the-counter and prescription medicines only as told by your health care provider.  Stay active by exercising, walking, or doing different activities. Ask your health care provider what activities are safe for you and how much exercise you need.  Drink enough fluid to keep your urine pale yellow.  Do not use any products that contain nicotine or tobacco, such as cigarettes, e-cigarettes, and chewing tobacco. If you need help quitting, ask your health care provider.  Keep all follow-up visits as told by your health care provider. This is important. Contact a health care provider if you:  Have redness, swelling, or more pain   in the affected area.  See a red streak or line that goes up or down from the affected area.  Have skin breakdown or skin loss in the affected area, even if the breakdown is small.  Get an injury in the affected area. Get help right away if:  You get an injury and an open wound in the  affected area.  You have: ? Severe pain that does not get better with medicine. ? Sudden numbness or weakness in the foot or ankle below the affected area. ? Trouble moving your foot or ankle. ? A fever. ? Worse or persistent symptoms. ? Chest pain. ? Shortness of breath. Summary  Chronic venous insufficiency is a condition where the leg veins cannot effectively pump blood from the legs to the heart.  Chronic venous insufficiency occurs when the vein walls become stretched, weakened, or damaged, or when valves within the vein are damaged.  Treatment depends on how severe your condition is. It often involves wearing compression stockings and may involve having a procedure.  Make sure you stay active by exercising, walking, or doing different activities. Ask your health care provider what activities are safe for you and how much exercise you need. This information is not intended to replace advice given to you by your health care provider. Make sure you discuss any questions you have with your health care provider. Document Released: 03/29/2007 Document Revised: 08/16/2018 Document Reviewed: 08/16/2018 Elsevier Patient Education  2020 [place] [employer] and [facility] about shingrix - new vaccine   Can go to [email or web address] for more information  Shingrix Vaccination  Two vaccines are licensed and recommended to prevent shingles in the [place] Zoster vaccine live (ZVL, Zostavax) has been in use since 2006. Recombinant zoster vaccine (RZV, Shingrix), has been in use since 2017 and is recommended by [employer] as the preferred shingles vaccine.  What Everyone Should Know about Shingles Vaccine (Shingrix) One of the Recommended Vaccines by Disease Shingles vaccination is the only way to protect against shingles and postherpetic neuralgia (PHN), the most common complication from shingles. CDC recommends that healthy adults 50 years  and older get two doses of the shingles vaccine called Shingrix (recombinant zoster vaccine), separated by 2 to 6 months, to prevent shingles and the complications from the disease. Your doctor or pharmacist can give you Shingrix as a shot in your upper arm. Shingrix provides strong protection against shingles and PHN. Two doses of Shingrix is more than 90% effective at preventing shingles and PHN. Protection stays above 85% for at least the first four years after you get vaccinated. Shingrix is the preferred vaccine, over Zostavax (zoster vaccine live), a shingles vaccine in use since 2006. Zostavax may still be used to prevent shingles in healthy adults 60 years and older. For example, you could use Zostavax if a person is allergic to Shingrix, prefers Zostavax, or requests immediate vaccination and Shingrix is unavailable. Who Should Get Shingrix? Healthy adults 50 years and older should get two doses of Shingrix, separated by 2 to 6 months. You should get Shingrix even if in the past you . had shingles  . received Zostavax  . are not sure if you had chickenpox There is no maximum age for getting Shingrix. If you had shingles in the past, you can get Shingrix to help prevent future occurrences of the disease. There is no specific length of time that you need to wait after having shingles before you can receive Shingrix, but generally you should  make sure the shingles rash has gone away before getting vaccinated. You can get Shingrix whether or not you remember having had chickenpox in the past. Studies show that more than 99% of Americans 40 years and older have had chickenpox, even if they don't remember having the disease. Chickenpox and shingles are related because they are caused by the same virus (varicella zoster virus). After a person recovers from chickenpox, the virus stays dormant (inactive) in the body. It can reactivate years later and cause shingles. If you had Zostavax in the recent past,  you should wait at least eight weeks before getting Shingrix. Talk to your healthcare provider to determine the best time to get Shingrix. Shingrix is available in [facility] and pharmacies. To find doctor's offices or pharmacies near you that offer the vaccine, visit [facility] Vaccine FinderExternal. If you have questions about Shingrix, talk with your healthcare provider. Vaccine for Those 80 Years and Older  Shingrix reduces the risk of shingles and PHN by more than 90% in people 52 and older. CDC recommends the vaccine for healthy adults 44 and older.  Who Should Not Get Shingrix? You should not get Shingrix if you: . have ever had a severe allergic reaction to any component of the vaccine or after a dose of Shingrix  . tested negative for immunity to varicella zoster virus. If you test negative, you should get chickenpox vaccine.  . currently have shingles  . currently are pregnant or breastfeeding. Women who are pregnant or breastfeeding should wait to get Shingrix.  Marland Kitchen receive specific antiviral drugs (acyclovir, famciclovir, or valacyclovir) 24 hours before vaccination (avoid use of these antiviral drugs for 14 days after vaccination)- zoster vaccine live only If you have a minor acute (starts suddenly) illness, such as a cold, you may get Shingrix. But if you have a moderate or severe acute illness, you should usually wait until you recover before getting the vaccine. This includes anyone with a temperature of 101.31F or higher. The side effects of the Shingrix are temporary, and usually last 2 to 3 days. While you may experience pain for a few days after getting Shingrix, the pain will be less severe than having shingles and the complications from the disease. How Well Does Shingrix Work? Two doses of Shingrix provides strong protection against shingles and postherpetic neuralgia (PHN), the most common complication of shingles. . In adults 72 to 68 years old who got two doses,  Shingrix was 97% effective in preventing shingles; among adults 70 years and older, Shingrix was 91% effective.  . In adults 65 to 68 years old who got two doses, Shingrix was 91% effective in preventing PHN; among adults 70 years and older, Shingrix was 89% effective. Shingrix protection remained high (more than 85%) in people 70 years and older throughout the four years following vaccination. Since your risk of shingles and PHN increases as you get older, it is important to have strong protection against shingles in your older years. Top of Page  What Are the Possible Side Effects of Shingrix? Studies show that Shingrix is safe. The vaccine helps your body create a strong defense against shingles. As a result, you are likely to have temporary side effects from getting the shots. The side effects may affect your ability to do normal daily activities for 2 to 3 days. Most people got a sore arm with mild or moderate pain after getting Shingrix, and some also had redness and swelling where they got the shot. Some people  felt tired, had muscle pain, a headache, shivering, fever, stomach pain, or nausea. About 1 out of 6 people who got Shingrix experienced side effects that prevented them from doing regular activities. Symptoms went away on their own in about 2 to 3 days. Side effects were more common in younger people. You might have a reaction to the first or second dose of Shingrix, or both doses. If you experience side effects, you may choose to take over-the-counter pain medicine such as ibuprofen or acetaminophen. If you experience side effects from Shingrix, you should report them to the Vaccine Adverse Event Reporting System (VAERS). Your doctor might file this report, or you can do it yourself through the VAERS websiteExternal, or by calling [phone]. If you have any questions about side effects from Shingrix, talk with your doctor. The shingles vaccine does not contain thimerosal (a  preservative containing mercury). Top of Page  When Should I See a Doctor Because of the Side Effects I Experience From Shingrix? In clinical trials, Shingrix was not associated with serious adverse events. In fact, serious side effects from vaccines are extremely rare. For example, for every 1 million doses of a vaccine given, only one or two people may have a severe allergic reaction. Signs of an allergic reaction happen within minutes or hours after vaccination and include hives, swelling of the face and throat, difficulty breathing, a fast heartbeat, dizziness, or weakness. If you experience these or any other life-threatening symptoms, see a doctor right away. Shingrix causes a strong response in your immune system, so it may produce short-term side effects more intense than you are used to from other vaccines. These side effects can be uncomfortable, but they are expected and usually go away on their own in 2 or 3 days. Top of Page  How Can I Pay For Shingrix? There are several ways shingles vaccine may be paid for: Medicare . Medicare Part D plans cover the shingles vaccine, but there may be a cost to you depending on your plan. There may be a copay for the vaccine, or you may need to pay in full then get reimbursed for a certain amount.  . Medicare Part B does not cover the shingles vaccine. Medicaid . Medicaid may or may not cover the vaccine. Contact your insurer to find out. Private health insurance . Many private health insurance plans will cover the vaccine, but there may be a cost to you depending on your plan. Contact your insurer to find out. Vaccine assistance programs . Some pharmaceutical companies provide vaccines to eligible adults who cannot afford them. You may want to check with the vaccine manufacturer, [employer], about Shingrix. If you do not currently have health insurance, learn more about affordable health coverage optionsExternal. To find doctor's offices or  pharmacies near you that offer the vaccine, visit [facility] Vaccine FinderExternal.

## 2019-07-14 LAB — CBC WITH DIFFERENTIAL/PLATELET
Absolute Monocytes: 350 cells/uL (ref 200–950)
Basophils Absolute: 33 cells/uL (ref 0–200)
Basophils Relative: 0.5 %
Eosinophils Absolute: 99 cells/uL (ref 15–500)
Eosinophils Relative: 1.5 %
HCT: 38.2 % (ref 35.0–45.0)
Hemoglobin: 13.5 g/dL (ref 11.7–15.5)
Lymphs Abs: 1954 cells/uL (ref 850–3900)
MCH: 31.3 pg (ref 27.0–33.0)
MCHC: 35.3 g/dL (ref 32.0–36.0)
MCV: 88.6 fL (ref 80.0–100.0)
MPV: 10.7 fL (ref 7.5–12.5)
Monocytes Relative: 5.3 %
Neutro Abs: 4165 cells/uL (ref 1500–7800)
Neutrophils Relative %: 63.1 %
Platelets: 190 10*3/uL (ref 140–400)
RBC: 4.31 10*6/uL (ref 3.80–5.10)
RDW: 12.9 % (ref 11.0–15.0)
Total Lymphocyte: 29.6 %
WBC: 6.6 10*3/uL (ref 3.8–10.8)

## 2019-07-14 LAB — MICROALBUMIN / CREATININE URINE RATIO
Creatinine, Urine: 68 mg/dL (ref 20–275)
Microalb Creat Ratio: 6 mcg/mg creat (ref <30)
Microalb, Ur: 0.4 mg/dL

## 2019-07-14 LAB — HEMOGLOBIN A1C
Hgb A1c MFr Bld: 5.8 % of total Hgb — ABNORMAL HIGH (ref <5.7)
Mean Plasma Glucose: 120 (calc)
eAG (mmol/L): 6.6 (calc)

## 2019-07-14 LAB — COMPLETE METABOLIC PANEL WITH GFR
AG Ratio: 1.9 (calc) (ref 1.0–2.5)
ALT: 13 U/L (ref 6–29)
AST: 17 U/L (ref 10–35)
Albumin: 4.2 g/dL (ref 3.6–5.1)
Alkaline phosphatase (APISO): 76 U/L (ref 37–153)
BUN: 16 mg/dL (ref 7–25)
CO2: 28 mmol/L (ref 20–32)
Calcium: 9.5 mg/dL (ref 8.6–10.4)
Chloride: 106 mmol/L (ref 98–110)
Creat: 0.75 mg/dL (ref 0.50–0.99)
GFR, Est African American: 95 mL/min/{1.73_m2} (ref >=60)
GFR, Est Non African American: 82 mL/min/{1.73_m2} (ref >=60)
Globulin: 2.2 g/dL (calc) (ref 1.9–3.7)
Glucose, Bld: 99 mg/dL (ref 65–99)
Potassium: 4.6 mmol/L (ref 3.5–5.3)
Sodium: 141 mmol/L (ref 135–146)
Total Bilirubin: 0.4 mg/dL (ref 0.2–1.2)
Total Protein: 6.4 g/dL (ref 6.1–8.1)

## 2019-07-14 LAB — URINALYSIS, ROUTINE W REFLEX MICROSCOPIC
Bilirubin Urine: NEGATIVE
Glucose, UA: NEGATIVE
Hgb urine dipstick: NEGATIVE
Ketones, ur: NEGATIVE
Leukocytes,Ua: NEGATIVE
Nitrite: NEGATIVE
Protein, ur: NEGATIVE
Specific Gravity, Urine: 1.015 (ref 1.001–1.03)
pH: 8 (ref 5.0–8.0)

## 2019-07-14 LAB — LIPID PANEL
Cholesterol: 146 mg/dL (ref <200)
HDL: 52 mg/dL (ref >=50)
LDL Cholesterol (Calc): 81 mg/dL (calc)
Non-HDL Cholesterol (Calc): 94 mg/dL (calc) (ref <130)
Total CHOL/HDL Ratio: 2.8 (calc) (ref <5.0)
Triglycerides: 53 mg/dL (ref <150)

## 2019-07-14 LAB — TSH: TSH: 1.12 mIU/L (ref 0.40–4.50)

## 2019-07-14 LAB — VITAMIN D 25 HYDROXY (VIT D DEFICIENCY, FRACTURES): Vit D, 25-Hydroxy: 86 ng/mL (ref 30–100)

## 2019-07-14 LAB — MAGNESIUM: Magnesium: 2.1 mg/dL (ref 1.5–2.5)

## 2019-09-11 ENCOUNTER — Ambulatory Visit: Payer: Self-pay | Admitting: Adult Health

## 2019-09-11 ENCOUNTER — Ambulatory Visit (INDEPENDENT_AMBULATORY_CARE_PROVIDER_SITE_OTHER): Payer: Medicare Other

## 2019-09-11 ENCOUNTER — Other Ambulatory Visit: Payer: Self-pay

## 2019-09-11 VITALS — Temp 97.8°F

## 2019-09-11 DIAGNOSIS — Z23 Encounter for immunization: Secondary | ICD-10-CM | POA: Diagnosis not present

## 2019-09-11 NOTE — Progress Notes (Signed)
REPORTS for HD flu.

## 2019-09-13 ENCOUNTER — Encounter: Payer: Self-pay | Admitting: Gynecology

## 2019-09-25 DIAGNOSIS — H2513 Age-related nuclear cataract, bilateral: Secondary | ICD-10-CM | POA: Diagnosis not present

## 2019-09-25 DIAGNOSIS — H52203 Unspecified astigmatism, bilateral: Secondary | ICD-10-CM | POA: Diagnosis not present

## 2019-10-12 NOTE — Progress Notes (Signed)
WELCOME TO MEDICARE ANNUAL WELLNESS VISIT AND 3 MONTH  Assessment:   Encounter for Medicare annual wellness exam  Essential hypertension - continue medications, DASH diet, exercise and monitor at home. Call if greater than 130/80.   History of ductal carcinoma in situ (DCIS) of breast, left, s/p resection UTD on mammograms Continue to monitor  Hyperlipidemia -continue medications, check lipids, decrease fatty foods, increase activity.   Prediabetes Discussed general issues about diabetes pathophysiology and management., Educational material distributed., Suggested low cholesterol diet., Encouraged aerobic exercise., Discussed foot care., Reminded to get yearly retinal exam. Check A1C annually; monitor weight and serum glucose trends;  CMP/GFR  Vitamin D deficiency Continue supplement  Medication management CBC, CMP/GFR, magnesium   Obesity Long discussion about weight loss, diet, and exercise Discussed final goal weight and current weight loss goal (<165lb) Patient will work on high protein snacks Patient on phentermine with benefit and no SE, taking drug breaks; continue close follow up. Return in     Over 40 minutes of exam, counseling, chart review and critical decision making was performed Future Appointments  Date Time Provider [place]  07/15/2020 10:00 AM Shawna Mutters, PA-C [facility] None     Plan:   During the course of the visit the patient was educated and counseled about appropriate screening and preventive services including:    Pneumococcal vaccine   Prevnar 13  Influenza vaccine  Td vaccine  Screening electrocardiogram  Bone densitometry screening  Colorectal cancer screening  Diabetes screening  Glaucoma screening  Nutrition counseling   Advanced directives: requested   Subjective:  Shawna [name] is a 68 y.o. female who presents for Medicare Annual Wellness Visit and OV.     Patient is s/p lumpectomy of L breast DCIS  in 2006. Continues with annual mammograms via breast center.   she is prescribed phentermine for weight loss, takes a few days a week only  While on the medication they have lost 0 lbs since last visit on our scale, but  They deny palpitations, anxiety, trouble sleeping, elevated BP.   BMI is Body mass index is 31.64 kg/m., she is working on diet and exercise. Wt Readings from Last 3 Encounters:  10/16/19 173 lb (78.5 kg)  07/13/19 169 lb 3.2 oz (76.7 kg)  04/06/19 171 lb (77.6 kg)   Typical breakfast/lunch: whole grain bread sandwich with turkey/lettuce/tomato and mustard, or an egg or cereal, fruit Typical snack: crackers Typical dinner: veggies with protein (baked or grilled)  Exercise: active around house and yard, but no intentional exercise  Water intake: unsweet tea, water   Her blood pressure has been controlled at home, today their BP is BP: 136/76  She does not workout but has part time job and takes care of her husband. She denies chest pain, shortness of breath, dizziness.   She is on cholesterol medication and denies myalgias. Her cholesterol is at goal. The cholesterol last visit was:   Lab Results  Component Value Date   CHOL 146 07/13/2019   HDL 52 07/13/2019   LDLCALC 81 07/13/2019   TRIG 53 07/13/2019   CHOLHDL 2.8 07/13/2019    She has been working on diet and exercise for prediabetes, and denies paresthesia of the feet, polydipsia, polyuria and visual disturbances. Last A1C in the office was:  Lab Results  Component Value Date   HGBA1C 5.8 (H) 07/13/2019   Last GFR: Lab Results  Component Value Date   [facility] 82 07/13/2019   Patient is on Vitamin D supplement.  Lab Results  Component Value Date   VD25OH 86 07/13/2019       Medication Review: Current Outpatient Medications on File Prior to Visit  Medication Sig Dispense Refill  . aspirin 81 MG tablet Take 81 mg by mouth daily.    Marland Kitchen atorvastatin (LIPITOR) 80 MG tablet TAKE ONE TABLET EACH DAY FOR  CHOLESTEROL 90 tablet 1  . Calcium Carb-Cholecalciferol (CALTRATE 600+D3 SOFT) 600-800 MG-UNIT CHEW Chew 1 Units by mouth daily.    . Cholecalciferol (VITAMIN D PO) Take 2,000 Units by mouth. Takes 10000 iu daily    . Magnesium 250 MG TABS Take by mouth daily.    . Multiple Vitamin (MULTIVITAMIN) tablet Take 1 tablet by mouth daily.      . phentermine (ADIPEX-P) 37.5 MG tablet TAKE ONE-HALF TO ONE TABLET EVERY MORNING FOR DIETING AND WEIGHT LOSS 30 tablet 2   No current facility-administered medications on file prior to visit.     Allergies  Allergen Reactions  . Nitrofurantoin Monohyd Macro Nausea Only    Current Problems (verified) Patient Active Problem List   Diagnosis Date Noted  . Medication management 09/05/2014  . Obesity (BMI 30.0-34.9) 09/05/2014  . Essential hypertension 11/24/2013  . Hyperlipidemia 11/24/2013  . Abnormal glucose 11/24/2013  . Vitamin D deficiency 11/24/2013  . DCIS (ductal carcinoma in situ) of breast, Left lumpectomy. 09/23/2005.     Screening Tests Immunization History  Administered Date(s) Administered  . DTaP 06/26/1999  . Influenza Split 09/05/2014, 10/04/2015  . Influenza Whole 09/01/2013  . Influenza, High Dose Seasonal PF 08/12/2017, 09/01/2018, 09/11/2019  . Influenza-Unspecified 09/09/2016  . PPD Test 03/05/2014, 03/11/2015, 03/30/2016  . Pneumococcal Conjugate-13 04/22/2017  . Pneumococcal Polysaccharide-23 02/04/2006, 09/01/2018  . Tdap 09/13/2009  . Zoster 10/04/2015   Preventative care: Last colonoscopy: 06/17/2018, q 5 years, dad with colon cancer, Dr. Oletta Lamas Last mammogram: 11/2018 Last pap smear/pelvic exam: 2019, due 12/2018 follows Dr. Phineas Real DEXA: 04/2018 - osteopenia Xray Cervical spine 2014  Prior vaccinations: TD or Tdap: 2010, declines, will get if needed Influenza: 2020  Pneumococcal: 2019 Prevnar13: 2018 Shingles/Zostavax: 2016  Names of Other Physician/Practitioners you currently use: 1. [facility] here for primary care 2. Dr. Delman Cheadle, eye doctor, last visit 2020, has upcoming appointment 3. Dr. Felipa Eth, dentist, last visit 2020, q 6 months   Patient Care Team: Unk Pinto, MD as PCP - General (Internal Medicine)  SURGICAL HISTORY She  has a past surgical history that includes Salpingoophorectomy (1978); Breast lumpectomy (Left, 2006); and Colonoscopy (N/A, 05/24/2013). FAMILY HISTORY Her family history includes Breast cancer in her paternal grandmother; Cancer in her sister; Colon cancer in her father; Diabetes in her sister; Heart disease in her sister; Hypertension in her mother. SOCIAL HISTORY She  reports that she has never smoked. She has never used smokeless tobacco. She reports that she does not drink alcohol or use drugs.   MEDICARE WELLNESS OBJECTIVES: Physical activity: Current Exercise Habits: The patient does not participate in regular exercise at present, Exercise limited by: None identified Cardiac risk factors: Cardiac Risk Factors include: advanced age (>62men, >39 women);hypertension;obesity (BMI >30kg/m2);dyslipidemia Depression/mood screen:   Depression screen [facility] 2/9 10/16/2019  Decreased Interest 0  Down, Depressed, Hopeless 0  PHQ - 2 Score 0    ADLs:  In your present state of health, do you have any difficulty performing the following activities: 10/16/2019 03/15/2019  Hearing? N N  Comment has bil hearing aids -  Vision? N N  Difficulty concentrating or  making decisions? N N  Walking or climbing stairs? N N  Dressing or bathing? N N  Doing errands, shopping? N N  Some recent data might be hidden     Cognitive Testing  Alert? Yes  Normal Appearance?Yes  Oriented to person? Yes  Place? Yes   Time? Yes  Recall of three objects?  Yes  Can perform simple calculations? Yes  Displays appropriate judgment?Yes  Can read the correct time from a watch face?Yes  EOL planning: Does Patient Have a Medical Advance  Directive?: No Would patient like information on creating a medical advance directive?: No - Patient declined  Review of Systems  Constitutional: Negative.  Negative for malaise/fatigue and weight loss.  HENT: Negative.  Negative for hearing loss and tinnitus.   Eyes: Negative.  Negative for blurred vision and double vision.  Respiratory: Negative.  Negative for cough, sputum production, shortness of breath and wheezing.   Cardiovascular: Negative.  Negative for chest pain, palpitations, orthopnea, claudication, leg swelling and PND.  Gastrointestinal: Negative.  Negative for abdominal pain, blood in stool, constipation, diarrhea, heartburn, melena, nausea and vomiting.  Genitourinary: Negative.   Musculoskeletal: Negative.  Negative for falls, joint pain and myalgias.  Skin: Negative.  Negative for rash.  Neurological: Negative.  Negative for dizziness, tingling, sensory change, weakness and headaches.  Endo/Heme/Allergies: Negative.  Negative for polydipsia.  Psychiatric/Behavioral: Negative.  Negative for depression, memory loss, substance abuse and suicidal ideas. The patient is not nervous/anxious and does not have insomnia.   All other systems reviewed and are negative.    Objective:     Today's Vitals   10/16/19 1003  BP: 136/76  Pulse: 82  Temp: (!) 96.6 F (35.9 C)  SpO2: 99%  Weight: 173 lb (78.5 kg)  Height: 5\' 2"  (1.575 m)   Body mass index is 31.64 kg/m.  General appearance: alert, no distress, WD/WN, female HEENT: normocephalic, sclerae anicteric, TMs pearly, nares patent, no discharge or erythema, pharynx normal Oral cavity: MMM, no lesions Neck: supple, no lymphadenopathy, no thyromegaly, no masses Heart: RRR, normal S1, S2, no murmurs Lungs: CTA bilaterally, no wheezes, rhonchi, or rales Abdomen: +bs, soft, non tender, non distended, no masses, no hepatomegaly, no splenomegaly Musculoskeletal: nontender, no swelling, no obvious deformity Extremities:  no  cyanosis, no clubbing, nonpitting edema bil (per patient chronic, improved with compression) Pulses: 2+ symmetric, upper and lower extremities, normal cap refill Neurological: alert, oriented x 3, CN2-12 intact, strength normal upper extremities and lower extremities, sensation normal throughout, DTRs 2+ throughout, no cerebellar signs, gait normal Psychiatric: normal affect, behavior normal, pleasant   Medicare Attestation I have personally reviewed: The patient's medical and social history Their use of alcohol, tobacco or illicit drugs Their current medications and supplements The patient's functional ability including ADLs,fall risks, home safety risks, cognitive, and hearing and visual impairment Diet and physical activities Evidence for depression or mood disorders  The patient's weight, height, BMI, and visual acuity have been recorded in the chart.  I have made referrals, counseling, and provided education to the patient based on review of the above and I have provided the patient with a written personalized care plan for preventive services.     Izora Ribas, NP   10/16/2019

## 2019-10-16 ENCOUNTER — Ambulatory Visit (INDEPENDENT_AMBULATORY_CARE_PROVIDER_SITE_OTHER): Payer: Medicare Other | Admitting: Adult Health

## 2019-10-16 ENCOUNTER — Other Ambulatory Visit: Payer: Self-pay

## 2019-10-16 ENCOUNTER — Encounter: Payer: Self-pay | Admitting: Adult Health

## 2019-10-16 VITALS — BP 136/76 | HR 82 | Temp 96.6°F | Ht 62.0 in | Wt 173.0 lb

## 2019-10-16 DIAGNOSIS — R7309 Other abnormal glucose: Secondary | ICD-10-CM

## 2019-10-16 DIAGNOSIS — E669 Obesity, unspecified: Secondary | ICD-10-CM

## 2019-10-16 DIAGNOSIS — Z0001 Encounter for general adult medical examination with abnormal findings: Secondary | ICD-10-CM | POA: Diagnosis not present

## 2019-10-16 DIAGNOSIS — I10 Essential (primary) hypertension: Secondary | ICD-10-CM | POA: Diagnosis not present

## 2019-10-16 DIAGNOSIS — Z79899 Other long term (current) drug therapy: Secondary | ICD-10-CM

## 2019-10-16 DIAGNOSIS — R6889 Other general symptoms and signs: Secondary | ICD-10-CM | POA: Diagnosis not present

## 2019-10-16 DIAGNOSIS — E782 Mixed hyperlipidemia: Secondary | ICD-10-CM

## 2019-10-16 DIAGNOSIS — Z86000 Personal history of in-situ neoplasm of breast: Secondary | ICD-10-CM

## 2019-10-16 DIAGNOSIS — E559 Vitamin D deficiency, unspecified: Secondary | ICD-10-CM

## 2019-10-16 DIAGNOSIS — E66811 Obesity, class 1: Secondary | ICD-10-CM

## 2019-10-16 DIAGNOSIS — Z0000 Encounter for general adult medical examination without abnormal findings: Secondary | ICD-10-CM

## 2019-10-16 NOTE — Patient Instructions (Addendum)
  Ms. Domino , Thank you for taking time to come for your Medicare Wellness Visit. I appreciate your ongoing commitment to your health goals. Please review the following plan we discussed and let me know if I can assist you in the future.   These are the goals we discussed: Goals    . Blood Pressure < 130/70    . Exercise 3x per week (15-30 min per time)    . HEMOGLOBIN A1C < 5.7    . Weight (lb) < 165 lb (74.8 kg)       This is a list of the screening recommended for you and due dates:  Health Maintenance  Topic Date Due  . Tetanus Vaccine  10/15/2020*  . Mammogram  12/05/2020  . Colon Cancer Screening  06/18/2023  . Flu Shot  Completed  . DEXA scan (bone density measurement)  Completed  .  Hepatitis C: One time screening is recommended by Center for Disease Control  (CDC) for  adults born from [birth year] through 1965.   Completed  . Pneumonia vaccines  Completed  . Pap Smear  Discontinued  *Topic was postponed. The date shown is not the original due date.    Recommend taking a break from phentermine every 3 months  Try to focus on high protein, high fiber in diet, particularly with snacks avoid plain crackers -   Better would be apple with peanut butter, or [place] on whole grain bread, etc    Drink 1/2 your body weight in fluid ounces of water daily; drink a tall glass of water 30 min before meals  Don't eat until you're stuffed- listen to your stomach and eat until you are 80% full   Try eating off of a salad plate; wait 10 min after finishing before going back for seconds  Start by eating the vegetables on your plate; aim for 50% of your meals to be fruits or vegetables  Then eat your protein - lean meats (grass fed if possible), fish, beans, nuts in moderation  Eat your carbs/starch last ONLY if you still are hungry. If you can, stop before finishing it all  Avoid sugar and flour - the closer it looks to it's original form in nature, typically the better it is for  you  Splurge in moderation - "assign" days when you get to splurge and have the "bad stuff" - I like to follow a 80% - 20% plan- "good" choices 80 % of the time, "bad" choices in moderation 20% of the time  Simple equation is: Calories out > calories in = weight loss - even if you eat the bad stuff, if you limit portions, you will still lose weight   Know what a healthy weight is for you (roughly BMI <25) and aim to maintain this  Aim for 7+ servings of fruits and vegetables daily  65-80+ fluid ounces of water or unsweet tea for healthy kidneys  Limit to max 1 drink of alcohol per day; avoid smoking/tobacco  Limit animal fats in diet for cholesterol and heart health - choose grass fed whenever available  Avoid highly processed foods, and foods high in saturated/trans fats  Aim for low stress - take time to unwind and care for your mental health  Aim for 150 min of moderate intensity exercise weekly for heart health, and weights twice weekly for bone health  Aim for 7-9 hours of sleep daily

## 2019-10-17 LAB — CBC WITH DIFFERENTIAL/PLATELET
Absolute Monocytes: 403 cells/uL (ref 200–950)
Basophils Absolute: 38 cells/uL (ref 0–200)
Basophils Relative: 0.6 %
Eosinophils Absolute: 160 cells/uL (ref 15–500)
Eosinophils Relative: 2.5 %
HCT: 39.4 % (ref 35.0–45.0)
Hemoglobin: 13.3 g/dL (ref 11.7–15.5)
Lymphs Abs: 2080 cells/uL (ref 850–3900)
MCH: 30.9 pg (ref 27.0–33.0)
MCHC: 33.8 g/dL (ref 32.0–36.0)
MCV: 91.4 fL (ref 80.0–100.0)
MPV: 10.8 fL (ref 7.5–12.5)
Monocytes Relative: 6.3 %
Neutro Abs: 3718 cells/uL (ref 1500–7800)
Neutrophils Relative %: 58.1 %
Platelets: 196 10*3/uL (ref 140–400)
RBC: 4.31 10*6/uL (ref 3.80–5.10)
RDW: 13.2 % (ref 11.0–15.0)
Total Lymphocyte: 32.5 %
WBC: 6.4 10*3/uL (ref 3.8–10.8)

## 2019-10-17 LAB — COMPLETE METABOLIC PANEL WITH GFR
AG Ratio: 2.1 (calc) (ref 1.0–2.5)
ALT: 15 U/L (ref 6–29)
AST: 19 U/L (ref 10–35)
Albumin: 4.1 g/dL (ref 3.6–5.1)
Alkaline phosphatase (APISO): 81 U/L (ref 37–153)
BUN: 19 mg/dL (ref 7–25)
CO2: 26 mmol/L (ref 20–32)
Calcium: 9.3 mg/dL (ref 8.6–10.4)
Chloride: 106 mmol/L (ref 98–110)
Creat: 0.78 mg/dL (ref 0.50–0.99)
GFR, Est African American: 91 mL/min/{1.73_m2} (ref >=60)
GFR, Est Non African American: 78 mL/min/{1.73_m2} (ref >=60)
Globulin: 2 g/dL (calc) (ref 1.9–3.7)
Glucose, Bld: 97 mg/dL (ref 65–99)
Potassium: 4.2 mmol/L (ref 3.5–5.3)
Sodium: 141 mmol/L (ref 135–146)
Total Bilirubin: 0.5 mg/dL (ref 0.2–1.2)
Total Protein: 6.1 g/dL (ref 6.1–8.1)

## 2019-10-17 LAB — TSH: TSH: 1.25 mIU/L (ref 0.40–4.50)

## 2019-10-17 LAB — LIPID PANEL
Cholesterol: 156 mg/dL (ref <200)
HDL: 55 mg/dL (ref >=50)
LDL Cholesterol (Calc): 88 mg/dL (calc)
Non-HDL Cholesterol (Calc): 101 mg/dL (calc) (ref <130)
Total CHOL/HDL Ratio: 2.8 (calc) (ref <5.0)
Triglycerides: 50 mg/dL (ref <150)

## 2019-10-17 LAB — MAGNESIUM: Magnesium: 1.9 mg/dL (ref 1.5–2.5)

## 2019-10-23 ENCOUNTER — Other Ambulatory Visit: Payer: Self-pay | Admitting: Internal Medicine

## 2019-10-23 DIAGNOSIS — Z1231 Encounter for screening mammogram for malignant neoplasm of breast: Secondary | ICD-10-CM

## 2019-12-18 ENCOUNTER — Other Ambulatory Visit: Payer: Self-pay

## 2019-12-18 ENCOUNTER — Ambulatory Visit
Admission: RE | Admit: 2019-12-18 | Discharge: 2019-12-18 | Disposition: A | Discharge status: Home / Self Care | Payer: Medicare Other | Source: Ambulatory Visit | Attending: Internal Medicine | Admitting: Internal Medicine

## 2019-12-18 DIAGNOSIS — Z1231 Encounter for screening mammogram for malignant neoplasm of breast: Secondary | ICD-10-CM

## 2020-02-14 ENCOUNTER — Telehealth (HOSPITAL_COMMUNITY): Payer: Self-pay | Admitting: Licensed Clinical Social Worker

## 2020-02-14 NOTE — Telephone Encounter (Signed)
CSW referred to assist wife and LVAD patient with obtaining Covid Vaccine appoint. CSW contacted the [facility] Vaccine line and obtained appointments for both patient and wife for March 29th @ 12:30pm. Wife informed of appointment and grateful for the support. Raquel Sarna, [place], [place]

## 2020-02-15 ENCOUNTER — Encounter: Payer: Self-pay | Admitting: Internal Medicine

## 2020-02-15 ENCOUNTER — Ambulatory Visit: Payer: Medicare Other | Admitting: Internal Medicine

## 2020-02-15 NOTE — Progress Notes (Addendum)
        R  E S C  H  E  D  U  L  E  D  (H  u  s  b  a n  d        A  t        ER )

## 2020-02-28 ENCOUNTER — Other Ambulatory Visit: Payer: Self-pay

## 2020-02-29 ENCOUNTER — Encounter: Payer: Self-pay | Admitting: Obstetrics and Gynecology

## 2020-02-29 ENCOUNTER — Ambulatory Visit (INDEPENDENT_AMBULATORY_CARE_PROVIDER_SITE_OTHER): Payer: Medicare Other | Admitting: Obstetrics and Gynecology

## 2020-02-29 VITALS — BP 128/80 | Ht 62.0 in | Wt 174.0 lb

## 2020-02-29 DIAGNOSIS — Z9289 Personal history of other medical treatment: Secondary | ICD-10-CM

## 2020-02-29 DIAGNOSIS — Z01419 Encounter for gynecological examination (general) (routine) without abnormal findings: Secondary | ICD-10-CM

## 2020-02-29 DIAGNOSIS — M8588 Other specified disorders of bone density and structure, other site: Secondary | ICD-10-CM

## 2020-02-29 DIAGNOSIS — Z124 Encounter for screening for malignant neoplasm of cervix: Secondary | ICD-10-CM

## 2020-02-29 DIAGNOSIS — M8580 Other specified disorders of bone density and structure, unspecified site: Secondary | ICD-10-CM

## 2020-02-29 DIAGNOSIS — Z853 Personal history of malignant neoplasm of breast: Secondary | ICD-10-CM

## 2020-02-29 NOTE — Progress Notes (Signed)
Shawna [name] 1950-12-10 [id number]  SUBJECTIVE:  69 y.o. G0P0000 female for annual routine gynecologic exam and Pap smear. She has no gynecologic concerns.  Current Outpatient Medications  Medication Sig Dispense Refill  . aspirin 81 MG tablet Take 81 mg by mouth daily.    Marland Kitchen atorvastatin (LIPITOR) 80 MG tablet TAKE ONE TABLET EACH DAY FOR CHOLESTEROL 90 tablet 1  . Calcium Carb-Cholecalciferol (CALTRATE 600+D3 SOFT) 600-800 MG-UNIT CHEW Chew 1 Units by mouth daily.    . Cholecalciferol (VITAMIN D PO) Take 2,000 Units by mouth. Takes 33295 iu daily    . Magnesium 250 MG TABS Take by mouth daily.    . Multiple Vitamin (MULTIVITAMIN) tablet Take 1 tablet by mouth daily.      . phentermine (ADIPEX-P) 37.5 MG tablet TAKE ONE-HALF TO ONE TABLET EVERY MORNING FOR DIETING AND WEIGHT LOSS 30 tablet 2   No current facility-administered medications for this visit.   Allergies: Nitrofurantoin monohyd macro  No LMP recorded. Patient is postmenopausal.  Past medical history,surgical history, problem list, medications, allergies, family history and social history were all reviewed and documented as reviewed in the EPIC chart.  ROS:  Feeling well. No dyspnea or chest pain on exertion.  No abdominal pain, change in bowel habits, black or bloody stools.  No urinary tract symptoms. GYN ROS: no new or enlarging lumps on self exam. No neurological complaints.   OBJECTIVE:  BP 128/80 (BP Location: Right Arm, Patient Position: Sitting, Cuff Size: Normal)   Ht 5\' 2"  (1.575 m)   Wt 174 lb (78.9 kg)   BMI 31.83 kg/m  The patient appears well, alert, oriented x 3, in no distress. ENT normal.  Neck supple. No cervical or supraclavicular adenopathy or thyromegaly.  Lungs are clear, good air entry, no wheezes, rhonchi or rales. S1 and S2 normal, no murmurs, regular rate and rhythm.  Abdomen soft without tenderness, guarding, mass or organomegaly.  Neurological is normal, no focal findings.  BREAST EXAM:  breasts appear normal, no suspicious masses, no skin or nipple changes or axillary nodes, left lumpectomy scar healed well  PELVIC EXAM: VULVA: normal appearing vulva with no masses, tenderness or lesions, VAGINA: normal appearing vagina with normal color and discharge, no lesions, CERVIX: normal appearing cervix without discharge or lesions, UTERUS: uterus is normal size, shape, consistency and nontender, ADNEXA: normal adnexa in size, nontender and no masses, PAP: Pap smear done today, thin-prep method  Chaperone: Bari Mantis Bonham present during the examination  ASSESSMENT:  69 y.o. G0P0000 here for annual gynecologic exam  PLAN:   1. Postmenopausal.  No significant hot flashes or night sweats.  No vaginal bleeding. 2. Pap smear 02/2017.  No history of abnormal Pap smears.  Patient desires to continue Pap smear screening. Pap smar is collected today. 3. History of left breast DCIS.   Mammogram 12/2019.  Normal breast exam today.  Continue SBE and annual mammogram. 4. Colonoscopy 2019.  Recommended that she follow up at the recommended interval.   5. Osteopenia. DEXA 2019.  T-score -1.7 was stable from prior DEXA.  Next DEXA recommended this year 04/2020 so she plans to schedule this. 6. Health maintenance.  No labs today as she normally has these completed with her primary care provider.   Return annually or sooner, prn.  Theresia Majors MD, FACOG  02/29/20

## 2020-03-01 LAB — PAP IG W/ RFLX HPV ASCU

## 2020-03-04 ENCOUNTER — Ambulatory Visit: Payer: Medicare Other

## 2020-03-04 ENCOUNTER — Ambulatory Visit: Payer: Medicare Other | Attending: Internal Medicine

## 2020-03-04 DIAGNOSIS — Z23 Encounter for immunization: Secondary | ICD-10-CM

## 2020-03-04 NOTE — Progress Notes (Signed)
Covid-19 Vaccination Clinic  Name:  Shawna [name]    MRN: [id number] DOB: [birth year]  03/04/2020  [name] was observed post Covid-19 immunization for 15 minutes without incident. She was provided with Vaccine Information Sheet and instruction to access the V-Safe system.   [name] was instructed to call 911 with any severe reactions post vaccine: Marland Kitchen Difficulty breathing  . Swelling of face and throat  . A fast heartbeat  . A bad rash all over body  . Dizziness and weakness   Immunizations Administered    Name Date Dose VIS Date Route   Pfizer COVID-19 Vaccine 03/04/2020 12:01 PM 0.3 mL 11/17/2019 Intramuscular   Manufacturer: [employer], [employer]   Lot: [id number]   NDC: [address]-4

## 2020-03-26 ENCOUNTER — Ambulatory Visit: Payer: Medicare Other | Attending: Internal Medicine

## 2020-03-26 DIAGNOSIS — Z23 Encounter for immunization: Secondary | ICD-10-CM

## 2020-03-26 NOTE — Progress Notes (Signed)
Covid-19 Vaccination Clinic  Name:  Shawna [name]    MRN: [id number] DOB: 1951-04-08  03/26/2020  Ms. Raczkowski was observed post Covid-19 immunization for 15 minutes without incident. She was provided with Vaccine Information Sheet and instruction to access the V-Safe system.   Ms. Mincy was instructed to call 911 with any severe reactions post vaccine: Marland Kitchen Difficulty breathing  . Swelling of face and throat  . A fast heartbeat  . A bad rash all over body  . Dizziness and weakness   Immunizations Administered    Name Date Dose VIS Date Route   Pfizer COVID-19 Vaccine 03/26/2020  2:36 PM 0.3 mL 01/31/2019 Intramuscular   Manufacturer: [employer], [employer]   Lot: [id number]   NDC: [address]-4

## 2020-07-10 NOTE — Progress Notes (Deleted)
CPE AND 3 MONTH  Assessment:   Encounter for Medicare annual wellness exam 1 year  Essential hypertension - continue medications, DASH diet, exercise and monitor at home. Call if greater than 130/80.   History of ductal carcinoma in situ (DCIS) of breast, left, s/p resection UTD on mammograms Continue to monitor  Hyperlipidemia -continue medications, check lipids, decrease fatty foods, increase activity.   Abnormal glucose Discussed general issues about diabetes pathophysiology and management., Educational material distributed., Suggested low cholesterol diet., Encouraged aerobic exercise., Discussed foot care., Reminded to get yearly retinal exam. Check A1C annually; monitor weight and serum glucose trends;  CMP/GFR  Vitamin D deficiency Continue supplement  Medication management CBC, CMP/GFR, magnesium   Obesity Long discussion about weight loss, diet, and exercise Discussed final goal weight and current weight loss goal (<165lb) Patient will work on high protein snacks Patient on phentermine with benefit and no SE, taking drug breaks; continue close follow up. Return in     Over 40 minutes of exam, counseling, chart review and critical decision making was performed Future Appointments  Date Time Provider [place]  07/15/2020 10:00 AM Vicie Mutters, PA-C [facility] None     Subjective:  Shawna [name] is a 69 y.o. female who presents for CPE and OV.     Patient is s/p lumpectomy of L breast DCIS in 2006. Continues with annual mammograms via breast center.   she is prescribed phentermine for weight loss, takes a few days a week only  While on the medication they have lost 0 lbs since last visit on our scale, but  They deny palpitations, anxiety, trouble sleeping, elevated BP.   BMI is There is no height or weight on file to calculate BMI., she is working on diet and exercise. Wt Readings from Last 3 Encounters:  02/29/20 174 lb (78.9 kg)  10/16/19 173 lb  (78.5 kg)  07/13/19 169 lb 3.2 oz (76.7 kg)   Her blood pressure has been controlled at home, today their BP is    She does not workout but has part time job and takes care of her husband. She denies chest pain, shortness of breath, dizziness.   She is on cholesterol medication and denies myalgias. Her cholesterol is at goal. The cholesterol last visit was:   Lab Results  Component Value Date   CHOL 156 10/16/2019   HDL 55 10/16/2019   LDLCALC 88 10/16/2019   TRIG 50 10/16/2019   CHOLHDL 2.8 10/16/2019    She has been working on diet and exercise for prediabetes, and denies paresthesia of the feet, polydipsia, polyuria and visual disturbances. Last A1C in the office was:  Lab Results  Component Value Date   HGBA1C 5.8 (H) 07/13/2019   Last GFR: Lab Results  Component Value Date   GFRNONAA 78 10/16/2019   Patient is on Vitamin D supplement.   Lab Results  Component Value Date   VD25OH 86 07/13/2019       Medication Review:   Current Outpatient Medications (Cardiovascular):  .  atorvastatin (LIPITOR) 80 MG tablet, TAKE ONE TABLET EACH DAY FOR CHOLESTEROL   Current Outpatient Medications (Analgesics):  .  aspirin 81 MG tablet, Take 81 mg by mouth daily.   Current Outpatient Medications (Other):  Marland Kitchen  Calcium Carb-Cholecalciferol (CALTRATE 600+D3 SOFT) 600-800 MG-UNIT CHEW, Chew 1 Units by mouth daily. .  Cholecalciferol (VITAMIN D PO), Take 2,000 Units by mouth. Takes 10000 iu daily .  Magnesium 250 MG TABS, Take by mouth daily. Marland Kitchen  Multiple Vitamin (MULTIVITAMIN) tablet, Take 1 tablet by mouth daily.   .  phentermine (ADIPEX-P) 37.5 MG tablet, TAKE ONE-HALF TO ONE TABLET EVERY MORNING FOR DIETING AND WEIGHT LOSS   Allergies  Allergen Reactions  . Nitrofurantoin Monohyd Macro Nausea Only    Current Problems (verified) Patient Active Problem List   Diagnosis Date Noted  . Medication management 09/05/2014  . Obesity (BMI 30.0-34.9) 09/05/2014  . Essential  hypertension 11/24/2013  . Hyperlipidemia 11/24/2013  . Abnormal glucose 11/24/2013  . Vitamin D deficiency 11/24/2013  . DCIS (ductal carcinoma in situ) of breast, Left lumpectomy. 09/23/2005.     Screening Tests Immunization History  Administered Date(s) Administered  . DTaP 06/26/1999  . Influenza Split 09/05/2014, 10/04/2015  . Influenza Whole 09/01/2013  . Influenza, High Dose Seasonal PF 08/12/2017, 09/01/2018, 09/11/2019  . Influenza-Unspecified 09/09/2016  . PFIZER SARS-COV-2 Vaccination 03/04/2020, 03/26/2020  . PPD Test 03/05/2014, 03/11/2015, 03/30/2016  . Pneumococcal Conjugate-13 04/22/2017  . Pneumococcal Polysaccharide-23 02/04/2006, 09/01/2018  . Tdap 09/13/2009  . Zoster 10/04/2015   Health Maintenance  Topic Date Due  . INFLUENZA VACCINE  07/07/2020  . TETANUS/TDAP  10/15/2020 (Originally 09/14/2019)  . MAMMOGRAM  12/17/2021  . COLONOSCOPY  06/18/2023  . DEXA SCAN  Completed  . COVID-19 Vaccine  Completed  . Hepatitis C Screening  Completed  . PNA vac Low Risk Adult  Completed  . PAP SMEAR-Modifier  Discontinued   Preventative care: Last colonoscopy: 06/17/2018, q 5 years, dad with colon cancer, Dr. Oletta Lamas Last mammogram: 11/2018 Last pap smear/pelvic exam: 2019, due 12/2018 follows Dr. Phineas Real DEXA: 04/2018 - osteopenia Xray Cervical spine 2014  Prior vaccinations: TD or Tdap: 2010, declines, will get if needed Influenza: 2020  Pneumococcal: 2019 Prevnar13: 2018 Shingles/Zostavax: 2016  Names of Other Physician/Practitioners you currently use: 1. [facility] here for primary care 2. Dr. Delman Cheadle, eye doctor, last visit 2020, has upcoming appointment 3. Dr. Felipa Eth, dentist, last visit 2020, q 6 months   Patient Care Team: Unk Pinto, MD as PCP - General (Internal Medicine)  SURGICAL HISTORY She  has a past surgical history that includes Salpingoophorectomy (1978); Breast lumpectomy (Left, 2006); and  Colonoscopy (N/A, 05/24/2013). FAMILY HISTORY Her family history includes Breast cancer in her paternal grandmother; Cancer in her sister; Colon cancer in her father; Diabetes in her sister; Heart disease in her sister; Hypertension in her mother. SOCIAL HISTORY She  reports that she has never smoked. She has never used smokeless tobacco. She reports that she does not drink alcohol and does not use drugs.   Review of Systems  Constitutional: Negative.  Negative for malaise/fatigue and weight loss.  HENT: Negative.  Negative for hearing loss and tinnitus.   Eyes: Negative.  Negative for blurred vision and double vision.  Respiratory: Negative.  Negative for cough, sputum production, shortness of breath and wheezing.   Cardiovascular: Negative.  Negative for chest pain, palpitations, orthopnea, claudication, leg swelling and PND.  Gastrointestinal: Negative.  Negative for abdominal pain, blood in stool, constipation, diarrhea, heartburn, melena, nausea and vomiting.  Genitourinary: Negative.   Musculoskeletal: Negative.  Negative for falls, joint pain and myalgias.  Skin: Negative.  Negative for rash.  Neurological: Negative.  Negative for dizziness, tingling, sensory change, weakness and headaches.  Endo/Heme/Allergies: Negative.  Negative for polydipsia.  Psychiatric/Behavioral: Negative.  Negative for depression, memory loss, substance abuse and suicidal ideas. The patient is not nervous/anxious and does not have insomnia.   All other systems reviewed and are negative.  Objective:     There were no vitals filed for this visit. There is no height or weight on file to calculate BMI.  General appearance: alert, no distress, WD/WN, female HEENT: normocephalic, sclerae anicteric, TMs pearly, nares patent, no discharge or erythema, pharynx normal Oral cavity: MMM, no lesions Neck: supple, no lymphadenopathy, no thyromegaly, no masses Heart: RRR, normal S1, S2, no murmurs Lungs: CTA  bilaterally, no wheezes, rhonchi, or rales Abdomen: +bs, soft, non tender, non distended, no masses, no hepatomegaly, no splenomegaly Musculoskeletal: nontender, no swelling, no obvious deformity Extremities:  no cyanosis, no clubbing, nonpitting edema bil (per patient chronic, improved with compression) Pulses: 2+ symmetric, upper and lower extremities, normal cap refill Neurological: alert, oriented x 3, CN2-12 intact, strength normal upper extremities and lower extremities, sensation normal throughout, DTRs 2+ throughout, no cerebellar signs, gait normal Psychiatric: normal affect, behavior normal, pleasant   Vicie Mutters, PA-C   07/10/2020

## 2020-07-15 ENCOUNTER — Encounter: Payer: Medicare Other | Admitting: Physician Assistant

## 2020-08-20 ENCOUNTER — Encounter: Payer: Self-pay | Admitting: Adult Health Nurse Practitioner

## 2020-08-20 ENCOUNTER — Ambulatory Visit (INDEPENDENT_AMBULATORY_CARE_PROVIDER_SITE_OTHER): Payer: Medicare Other | Admitting: Adult Health Nurse Practitioner

## 2020-08-20 ENCOUNTER — Other Ambulatory Visit: Payer: Self-pay

## 2020-08-20 VITALS — BP 132/88 | HR 63 | Temp 97.3°F | Wt 182.0 lb

## 2020-08-20 DIAGNOSIS — R7309 Other abnormal glucose: Secondary | ICD-10-CM | POA: Diagnosis not present

## 2020-08-20 DIAGNOSIS — E782 Mixed hyperlipidemia: Secondary | ICD-10-CM | POA: Diagnosis not present

## 2020-08-20 DIAGNOSIS — R300 Dysuria: Secondary | ICD-10-CM | POA: Diagnosis not present

## 2020-08-20 DIAGNOSIS — E559 Vitamin D deficiency, unspecified: Secondary | ICD-10-CM | POA: Diagnosis not present

## 2020-08-20 DIAGNOSIS — R600 Localized edema: Secondary | ICD-10-CM | POA: Diagnosis not present

## 2020-08-20 DIAGNOSIS — I10 Essential (primary) hypertension: Secondary | ICD-10-CM

## 2020-08-20 NOTE — Progress Notes (Signed)
Assessment and Plan:  Shawna [name] was seen today for acute visit, urinary tract infection and edema.  Diagnoses and all orders for this visit:  Dysuria -     Urinalysis w microscopic + reflex cultur Increase water intake -     Urine Culture -     REFLEXIVE URINE CULTURE  Bilateral leg edema Wear compression stockings daily, remove at night Daily weights Increase water intake  Essential hypertension Continue current medications: Monitor blood pressure at home; call if consistently over 130/80 Continue DASH diet.   Reminder to go to the ER if any CP, SOB, nausea, dizziness, severe HA, changes vision/speech, left arm numbness and tingling and jaw pain. -     CBC with Differential/Platelet -     COMPLETE METABOLIC PANEL WITH GFR -     TSH -     Magnesium  Mixed hyperlipidemia Continue medications: Discussed dietary and exercise modifications Low fat diet -     Lipid panel  Vitamin D deficiency Continue supplementation to maintain goal of 70-100 Taking Vitamin D 2,000IU IU daily Defer vitamin D level  Abnormal glucose Discussed dietary and exercise modifications -     Hemoglobin A1c   Close follow up in two weeks, contact office with any new or worsening symptoms.  Further disposition pending results of labs. Discussed med's effects and SE's.   Over 30 minutes of face to face interview, exam, counseling, chart review, and critical decision making was performed.   Future Appointments  Date Time Provider [place]  10/01/2020 10:00 AM Garnet Sierras, NP [facility] None    ------------------------------------------------------------------------------------------------------------------   HPI 69 y.o.female presents for evaluation of bilateral lower extremity edema.  She reports she does prop her feet up when she sleeps in the recliner.  In the mornings in the past they would derease and back to baseline in the morning.  She does have compression stockings at  home but does not wear them constantly   She reports that she drinks unsweet tea, one cup of coffee in th morning.  Sh reports sh tris to drink water but knows she needs to drink more.  She does weight herself regularly and has noted 180-182  Sh reports she has had some cloudy urine.  Denies any frequency but does have some urgency.  She is overdue for routine check and lab work, will check today.  Past Medical History:  Diagnosis Date   Cancer ([place])    Breast   DCIS (ductal carcinoma in situ) of breast    left   Hypercholesteremia    Osteopenia 04/2018   T score -1.7.  Overall stable from prior DEXA   Serous cyst of ovary    left     Allergies  Allergen Reactions   Nitrofurantoin Monohyd Macro Nausea Only    Current Outpatient Medications on File Prior to Visit  Medication Sig   aspirin 81 MG tablet Take 81 mg by mouth daily.   atorvastatin (LIPITOR) 80 MG tablet TAKE ONE TABLET EACH DAY FOR CHOLESTEROL   Calcium Carb-Cholecalciferol (CALTRATE 600+D3 SOFT) 600-800 MG-UNIT CHEW Chew 1 Units by mouth daily.   Cholecalciferol (VITAMIN D PO) Take 2,000 Units by mouth. Takes 10000 iu daily   Magnesium 250 MG TABS Take by mouth daily.   Multiple Vitamin (MULTIVITAMIN) tablet Take 1 tablet by mouth daily.     phentermine (ADIPEX-P) 37.5 MG tablet TAKE ONE-HALF TO ONE TABLET EVERY MORNING FOR DIETING AND WEIGHT LOSS   No current facility-administered medications on file  prior to visit.   Body mass index is 33.29 kg/m.   She has not been working on diet and exercise at this time.  She has been prescribed phentermine in the past, but is not currently taking this.  Her blood pressure has been controlled and today her BP is 132/88.  She does not workout. She denies chest pain, shortness of breath, dizziness.  Lipid Panel     Component Value Date/Time   CHOL 187 08/20/2020 1642   TRIG 148 08/20/2020 1642   HDL 61 08/20/2020 1642   CHOLHDL 3.1 08/20/2020 1642    VLDL 15 04/22/2017 0930   LDLCALC 101 (H) 08/20/2020 1642   She has not been working on diet and exercise for Diabetes , she is2 on bASA, she is not on ACE/ARB, and denies paresthesia of the feet, polydipsia, polyuria and visual disturbances. Last A1C was:  Lab Results  Component Value Date   HGBA1C 6.1 (H) 08/20/2020    ROS: all negative except above.   Physical Exam:  BP 132/88    Pulse 63    Temp (!) 97.3 F (36.3 C)    Wt 182 lb (82.6 kg)    SpO2 97%    BMI 33.29 kg/m   General Appearance: Well nourished, in no apparent distress. Eyes: PERRLA, EOMs, conjunctiva no swelling or erythema Sinuses: No Frontal/maxillary tenderness ENT/Mouth: Ext aud canals clear, TMs without erythema, bulging. No erythema, swelling, or exudate on post pharynx.  Tonsils not swollen or erythematous. Hearing normal.  Neck: Supple, thyroid normal.  Respiratory: Respiratory effort normal, BS equal bilaterally without rales, rhonchi, wheezing or stridor.  Cardio: RRR with no MRGs. Brisk peripheral pulses. +1 pitting edema to bilateral lower extremities.  Abdomen: Soft, + BS.  Non tender, no guarding, rebound, hernias, masses. Lymphatics: Non tender without lymphadenopathy.  Musculoskeletal: Full ROM, 5/5 strength, normal gait.  Skin: Warm, dry without rashes, lesions, ecchymosis.  Neuro: Cranial nerves intact. Normal muscle tone, no cerebellar symptoms. Sensation intact.  Psych: Awake and oriented X 3, normal affect, Insight and Judgment appropriate.     Garnet Sierras, NP 4:24 PM [facility]

## 2020-08-20 NOTE — Patient Instructions (Addendum)
   Wear compression stockings.  Put them on every morning and take them off at night.  When you are sitting elevate your legs above your heart to help decrease the swelling.  Try to drink 40-60oz a day   We will check labs today.  We will call you with lab results.    As we discussed in the appointment increasing water intake and compression stockings can help with swelling in your legs.  We may add a pill to help decrease the fluid in your legs after getting your labs back.   Look for a medium compression knee high stocking.  If they irritate the skin behind your knee look for thigh high compression stockings.  COMPRESSION STOCKINGS  HOME CARE INSTRUCTIONS   Do not stand or sit in one position for long periods of time. Do not sit with your legs crossed. Rest with your legs raised during the day.  Your legs have to be higher than your heart so that gravity will force the valves to open, so please really elevate your legs.   Wear elastic stockings or support hose. Do not wear other tight, encircling garments around the legs, pelvis, or waist.    [employer]  has a wide variety of well priced compression stockings. [place], [place] [address] [address] has a good cheap selection, I like the socks, they are not as hard to get on  Walk as much as possible to increase blood flow.  Raise the foot of your bed at night with 2-inch blocks. SEEK MEDICAL CARE IF:   The skin around your ankle starts to break down.  You have pain, redness, tenderness, or hard swelling developing in your leg over a vein.  You are uncomfortable due to leg pain. Document Released: 09/02/2005 Document Revised: 02/15/2012 Document Reviewed: 01/19/2011 [facility] Patient Information 2014 [place].

## 2020-08-21 ENCOUNTER — Other Ambulatory Visit: Payer: Self-pay | Admitting: Adult Health Nurse Practitioner

## 2020-08-21 DIAGNOSIS — N3000 Acute cystitis without hematuria: Secondary | ICD-10-CM

## 2020-08-21 MED ORDER — SULFAMETHOXAZOLE-TRIMETHOPRIM 800-160 MG PO TABS: 1.0000 | ORAL_TABLET | Freq: Two times a day (BID) | ORAL | 0 refills | Status: AC

## 2020-08-22 LAB — URINALYSIS W MICROSCOPIC + REFLEX CULTURE
Bacteria, UA: NONE SEEN /HPF
Bilirubin Urine: NEGATIVE
Glucose, UA: NEGATIVE
Hyaline Cast: NONE SEEN /LPF
Ketones, ur: NEGATIVE
Nitrites, Initial: NEGATIVE
Protein, ur: NEGATIVE
RBC / HPF: NONE SEEN /HPF (ref 0–2)
Specific Gravity, Urine: 1.016 (ref 1.001–1.03)
pH: 6.5 (ref 5.0–8.0)

## 2020-08-22 LAB — CBC WITH DIFFERENTIAL/PLATELET
Absolute Monocytes: 694 cells/uL (ref 200–950)
Basophils Absolute: 19 cells/uL (ref 0–200)
Basophils Relative: 0.2 %
Eosinophils Absolute: 162 cells/uL (ref 15–500)
Eosinophils Relative: 1.7 %
HCT: 41.6 % (ref 35.0–45.0)
Hemoglobin: 14.1 g/dL (ref 11.7–15.5)
Lymphs Abs: 2746 cells/uL (ref 850–3900)
MCH: 30.9 pg (ref 27.0–33.0)
MCHC: 33.9 g/dL (ref 32.0–36.0)
MCV: 91 fL (ref 80.0–100.0)
MPV: 10.6 fL (ref 7.5–12.5)
Monocytes Relative: 7.3 %
Neutro Abs: 5881 cells/uL (ref 1500–7800)
Neutrophils Relative %: 61.9 %
Platelets: 196 10*3/uL (ref 140–400)
RBC: 4.57 10*6/uL (ref 3.80–5.10)
RDW: 13 % (ref 11.0–15.0)
Total Lymphocyte: 28.9 %
WBC: 9.5 10*3/uL (ref 3.8–10.8)

## 2020-08-22 LAB — LIPID PANEL
Cholesterol: 187 mg/dL (ref <200)
HDL: 61 mg/dL (ref >=50)
LDL Cholesterol (Calc): 101 mg/dL (calc) — ABNORMAL HIGH
Non-HDL Cholesterol (Calc): 126 mg/dL (calc) (ref <130)
Total CHOL/HDL Ratio: 3.1 (calc) (ref <5.0)
Triglycerides: 148 mg/dL (ref <150)

## 2020-08-22 LAB — COMPLETE METABOLIC PANEL WITH GFR
AG Ratio: 1.9 (calc) (ref 1.0–2.5)
ALT: 17 U/L (ref 6–29)
AST: 21 U/L (ref 10–35)
Albumin: 4.4 g/dL (ref 3.6–5.1)
Alkaline phosphatase (APISO): 90 U/L (ref 37–153)
BUN: 23 mg/dL (ref 7–25)
CO2: 27 mmol/L (ref 20–32)
Calcium: 9.9 mg/dL (ref 8.6–10.4)
Chloride: 104 mmol/L (ref 98–110)
Creat: 0.89 mg/dL (ref 0.50–0.99)
GFR, Est African American: 77 mL/min/{1.73_m2} (ref >=60)
GFR, Est Non African American: 66 mL/min/{1.73_m2} (ref >=60)
Globulin: 2.3 g/dL (calc) (ref 1.9–3.7)
Glucose, Bld: 87 mg/dL (ref 65–99)
Potassium: 4.3 mmol/L (ref 3.5–5.3)
Sodium: 142 mmol/L (ref 135–146)
Total Bilirubin: 0.5 mg/dL (ref 0.2–1.2)
Total Protein: 6.7 g/dL (ref 6.1–8.1)

## 2020-08-22 LAB — TSH: TSH: 1.42 mIU/L (ref 0.40–4.50)

## 2020-08-22 LAB — HEMOGLOBIN A1C
Hgb A1c MFr Bld: 6.1 % of total Hgb — ABNORMAL HIGH (ref <5.7)
Mean Plasma Glucose: 128 (calc)
eAG (mmol/L): 7.1 (calc)

## 2020-08-22 LAB — URINE CULTURE
MICRO NUMBER:: 10950604
SPECIMEN QUALITY:: ADEQUATE

## 2020-08-22 LAB — CULTURE INDICATED

## 2020-08-22 LAB — MAGNESIUM: Magnesium: 2.1 mg/dL (ref 1.5–2.5)

## 2020-08-28 ENCOUNTER — Telehealth: Payer: Self-pay

## 2020-08-28 NOTE — Telephone Encounter (Signed)
Left voice message for return call to address her questions & concerns.

## 2020-09-09 NOTE — Progress Notes (Signed)
FOLLOW UP from UTI & BLE EDEMA  Assessment and Plan:  Shawna [name] was seen today for acute visit, urinary tract infection and edema.  Diagnoses and all orders for this visit:  Dysuria -     Urinalysis w microscopic + reflex cultur Increase water intake -     Urine Culture -     REFLEXIVE URINE CULTURE  Bilateral leg edema Wear compression stockings daily, remove at night Daily weights Increase water intake  Essential hypertension Elevated today, has been borderline in past Rx: HCTZ 25mg  daily Monitor blood pressure at home; call if consistently over 130/80 Continue DASH diet.   Reminder to go to the ER if any CP, SOB, nausea, dizziness, severe HA, changes vision/speech, left arm numbness and tingling and jaw pain. -     CBC with Differential/Platelet -     COMPLETE METABOLIC PANEL WITH GFR -     TSH -     Magnesium  Influenza vaccine given Received today  Bilateral lower extremity edema Discussed increasing water intake HCTZ for blood pressure may help Monitor weight daily  Medication Management Continued  Close follow up in two weeks with blood pressure log, contact office with any new or worsening symptoms.  Further disposition pending results of labs. Discussed med's effects and SE's.   Over 30 minutes of face to face interview, exam, counseling, chart review, and critical decision making was performed.   Future Appointments  Date Time Provider [place]  09/12/2020  9:30 AM Garnet Sierras, NP [facility] None  10/01/2020 10:00 AM Winford Hehn, Danton Sewer, NP [facility] None    ------------------------------------------------------------------------------------------------------------------   HPI 69 y.o.female present for follow up after UTI and on BLE edema.  She reports that got some compressing stockings and has been wearing them every day.  She puts them on in the morning and takes them off at night.  She reports in the morning her leg edema is not present.    She has increased her water intake to 3 bottles of water a day from one.  Lost husband and niece to liver cancer 08/2020.   She reports she has good family support.  She is tearful with conversation regarding this.  Discussed counseling services with patient.   From previous OV 08/20/20:  presents for evaluation of bilateral lower extremity edema.  She reports she does prop her feet up when she sleeps in the recliner.  In the mornings in the past they would derease and back to baseline in the morning.  She does have compression stockings at home but does not wear them constantly   She reports that she drinks unsweet tea, one cup of coffee in th morning.  Sh reports sh tris to drink water but knows she needs to drink more.  She does weight herself regularly and has noted 180-182  Sh reports she has had some cloudy urine.  Denies any frequency but does have some urgency.  She is overdue for routine check and lab work, will check today.  Past Medical History:  Diagnosis Date  . Cancer ([facility])    Breast  . 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 Hypercholesteremia   . Osteopenia 04/2018   T score -1.7.  Overall stable from prior DEXA  . Serous cyst of ovary    left     Allergies  Allergen Reactions  . Nitrofurantoin Monohyd Macro Nausea Only    Current Outpatient Medications on File Prior to Visit  Medication Sig  .  aspirin 81 MG tablet Take 81 mg by mouth daily.  Marland Kitchen atorvastatin (LIPITOR) 80 MG tablet TAKE ONE TABLET EACH DAY FOR CHOLESTEROL  . Calcium Carb-Cholecalciferol (CALTRATE 600+D3 SOFT) 600-800 MG-UNIT CHEW Chew 1 Units by mouth daily.  . Cholecalciferol (VITAMIN D PO) Take 2,000 Units by mouth. Takes 10000 iu daily  . Magnesium 250 MG TABS Take by mouth daily.  . Multiple Vitamin (MULTIVITAMIN) tablet Take 1 tablet by mouth daily.    . phentermine (ADIPEX-P) 37.5 MG tablet TAKE ONE-HALF TO ONE TABLET EVERY MORNING FOR DIETING AND WEIGHT LOSS   No current  facility-administered medications on file prior to visit.   There is no height or weight on file to calculate BMI.   She has not been working on diet and exercise at this time.  She has been prescribed phentermine in the past, but is not currently taking this.  Her blood pressure has been controlled and today her BP is 132/88.  She does not workout. She denies chest pain, shortness of breath, dizziness.  Lipid Panel     Component Value Date/Time   CHOL 187 08/20/2020 1642   TRIG 148 08/20/2020 1642   HDL 61 08/20/2020 1642   CHOLHDL 3.1 08/20/2020 1642   VLDL 15 04/22/2017 0930   LDLCALC 101 (H) 08/20/2020 1642   She has not been working on diet and exercise for Diabetes , she is2 on bASA, she is not on ACE/ARB, and denies paresthesia of the feet, polydipsia, polyuria and visual disturbances. Last A1C was:  Lab Results  Component Value Date   HGBA1C 6.1 (H) 08/20/2020    ROS: all negative except above.   Physical Exam:  There were no vitals taken for this visit.  General Appearance: Well nourished, in no apparent distress. Eyes: PERRLA, EOMs, conjunctiva no swelling or erythema Sinuses: No Frontal/maxillary tenderness ENT/Mouth: Ext aud canals clear, TMs without erythema, bulging. No erythema, swelling, or exudate on post pharynx.  Tonsils not swollen or erythematous. Hearing normal.  Neck: Supple, thyroid normal.  Respiratory: Respiratory effort normal, BS equal bilaterally without rales, rhonchi, wheezing or stridor.  Cardio: RRR with no MRGs. Brisk peripheral pulses. +1 pitting edema to bilateral lower extremities.  Abdomen: Soft, + BS.  Non tender, no guarding, rebound, hernias, masses. Lymphatics: Non tender without lymphadenopathy.  Musculoskeletal: Full ROM, 5/5 strength, normal gait.  Skin: Warm, dry without rashes, lesions, ecchymosis.  Neuro: Cranial nerves intact. Normal muscle tone, no cerebellar symptoms. Sensation intact.  Psych: Awake and oriented X 3, normal  affect, Insight and Judgment appropriate.     Garnet Sierras, NP 4:24 PM [facility]

## 2020-09-12 ENCOUNTER — Ambulatory Visit (INDEPENDENT_AMBULATORY_CARE_PROVIDER_SITE_OTHER): Payer: Medicare Other | Admitting: Adult Health Nurse Practitioner

## 2020-09-12 ENCOUNTER — Encounter: Payer: Self-pay | Admitting: Adult Health Nurse Practitioner

## 2020-09-12 ENCOUNTER — Other Ambulatory Visit: Payer: Self-pay

## 2020-09-12 VITALS — BP 158/88 | HR 75 | Temp 96.8°F | Wt 189.0 lb

## 2020-09-12 DIAGNOSIS — R600 Localized edema: Secondary | ICD-10-CM | POA: Diagnosis not present

## 2020-09-12 DIAGNOSIS — Z79899 Other long term (current) drug therapy: Secondary | ICD-10-CM

## 2020-09-12 DIAGNOSIS — Z23 Encounter for immunization: Secondary | ICD-10-CM | POA: Diagnosis not present

## 2020-09-12 DIAGNOSIS — R300 Dysuria: Secondary | ICD-10-CM | POA: Diagnosis not present

## 2020-09-12 DIAGNOSIS — I10 Essential (primary) hypertension: Secondary | ICD-10-CM | POA: Diagnosis not present

## 2020-09-12 MED ORDER — HYDROCHLOROTHIAZIDE 25 MG PO TABS: 25.0000 mg | ORAL_TABLET | Freq: Every day | ORAL | 1 refills | Status: DC

## 2020-09-12 NOTE — Patient Instructions (Addendum)
  Check your blood pressure twice a day and write it down.  Bring this to the follow up appointment in two weeks..  We are starting you on HCTZ 25mg  daily in the morning.  This will help to reduce the fluid in your legs as well as blood pressure.

## 2020-09-13 LAB — NO CULTURE INDICATED

## 2020-09-13 LAB — URINALYSIS W MICROSCOPIC + REFLEX CULTURE
Bacteria, UA: NONE SEEN /HPF
Bilirubin Urine: NEGATIVE
Glucose, UA: NEGATIVE
Hgb urine dipstick: NEGATIVE
Hyaline Cast: NONE SEEN /LPF
Ketones, ur: NEGATIVE
Leukocyte Esterase: NEGATIVE
Nitrites, Initial: NEGATIVE
Protein, ur: NEGATIVE
RBC / HPF: NONE SEEN /HPF (ref 0–2)
Specific Gravity, Urine: 1.004 (ref 1.001–1.03)
Squamous Epithelial / HPF: NONE SEEN /HPF (ref <=5)
WBC, UA: NONE SEEN /HPF (ref 0–5)
pH: 7.5 (ref 5.0–8.0)

## 2020-09-17 NOTE — Progress Notes (Signed)
Patient is aware of urine lab results and states the she doesn't have any symptoms. -e welch

## 2020-09-26 ENCOUNTER — Encounter: Payer: Self-pay | Admitting: Adult Health Nurse Practitioner

## 2020-09-26 ENCOUNTER — Ambulatory Visit (INDEPENDENT_AMBULATORY_CARE_PROVIDER_SITE_OTHER): Payer: Medicare Other | Admitting: Adult Health Nurse Practitioner

## 2020-09-26 ENCOUNTER — Other Ambulatory Visit: Payer: Self-pay

## 2020-09-26 VITALS — BP 124/78 | HR 65 | Temp 97.3°F | Wt 182.0 lb

## 2020-09-26 DIAGNOSIS — R300 Dysuria: Secondary | ICD-10-CM

## 2020-09-26 DIAGNOSIS — H2513 Age-related nuclear cataract, bilateral: Secondary | ICD-10-CM | POA: Diagnosis not present

## 2020-09-26 DIAGNOSIS — H5213 Myopia, bilateral: Secondary | ICD-10-CM | POA: Diagnosis not present

## 2020-09-26 DIAGNOSIS — I10 Essential (primary) hypertension: Secondary | ICD-10-CM | POA: Diagnosis not present

## 2020-09-26 DIAGNOSIS — R600 Localized edema: Secondary | ICD-10-CM | POA: Diagnosis not present

## 2020-09-26 DIAGNOSIS — H52203 Unspecified astigmatism, bilateral: Secondary | ICD-10-CM | POA: Diagnosis not present

## 2020-09-26 LAB — COMPLETE METABOLIC PANEL WITH GFR
AG Ratio: 2 (calc) (ref 1.0–2.5)
ALT: 19 U/L (ref 6–29)
AST: 24 U/L (ref 10–35)
Albumin: 4.3 g/dL (ref 3.6–5.1)
Alkaline phosphatase (APISO): 88 U/L (ref 37–153)
BUN: 17 mg/dL (ref 7–25)
CO2: 31 mmol/L (ref 20–32)
Calcium: 9.9 mg/dL (ref 8.6–10.4)
Chloride: 100 mmol/L (ref 98–110)
Creat: 0.97 mg/dL (ref 0.50–0.99)
GFR, Est African American: 69 mL/min/{1.73_m2} (ref >=60)
GFR, Est Non African American: 60 mL/min/{1.73_m2} (ref >=60)
Globulin: 2.2 g/dL (calc) (ref 1.9–3.7)
Glucose, Bld: 88 mg/dL (ref 65–99)
Potassium: 4.1 mmol/L (ref 3.5–5.3)
Sodium: 140 mmol/L (ref 135–146)
Total Bilirubin: 0.5 mg/dL (ref 0.2–1.2)
Total Protein: 6.5 g/dL (ref 6.1–8.1)

## 2020-09-26 NOTE — Progress Notes (Signed)
FOLLOW UP from UTI & BLE EDEMA  Assessment and Plan:  Shawna [name] was seen today for acute visit, urinary tract infection and edema.  Diagnoses and all orders for this visit:  Dysuria Improved Continue to monitor  Bilateral leg edema Wear compression stockings daily, remove at night Daily weights Increase water intake  Essential hypertension Elevated today, has been borderline in past Continue HCTZ 25mg  daily Monitor blood pressure at home; call if consistently over 130/80 Continue DASH diet.   Reminder to go to the ER if any CP, SOB, nausea, dizziness, severe HA, changes vision/speech, left arm numbness and tingling and jaw pain. -     CBC with Differential/Platelet -     COMPLETE METABOLIC PANEL WITH GFR -     TSH -     Magnesium  Medication Management Continued  Further disposition pending results of labs. Discussed med's effects and SE's.   Over 30 minutes of face to face interview, exam, counseling, chart review, and critical decision making was performed.   Future Appointments  Date Time Provider [place]  10/16/2020  2:00 PM Brittnie Lewey, Danton Sewer, NP [facility] None    ------------------------------------------------------------------------------------------------------------------   HPI 69 y.o.female present for follow up after UTI and on BLE edema.  She reports that got some compressing stockings and has been wearing them every day.  She puts them on in the morning and takes them off at night.  She reports in the morning her leg edema is not present.   She has increased her water intake to 3 bottles of water a day from one. She was monitoring her blood pressure daily and it was ranging from 115's to 130's over 70's-80's. No hypotension.  Patient denies any side effects from the medication.  Lost husband and niece to liver cancer 08/2020.   She reports she has good family support.  She is tearful with conversation regarding this.  Discussed counseling services with  patient.   From previous OV 08/20/20:  presents for evaluation of bilateral lower extremity edema.  She reports she does prop her feet up when she sleeps in the recliner.  In the mornings in the past they would derease and back to baseline in the morning.  She does have compression stockings at home but does not wear them constantly   She reports that she drinks unsweet tea, one cup of coffee in th morning.  Sh reports sh tris to drink water but knows she needs to drink more.  She does weight herself regularly and has noted 180-182  Sh reports she has had some cloudy urine.  Denies any frequency but does have some urgency.  She is overdue for routine check and lab work, will check today.  Past Medical History:  Diagnosis Date  . Cancer (HCC)    Breast  . 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 Hypercholesteremia   . Osteopenia 04/2018   T score -1.7.  Overall stable from prior DEXA  . Serous cyst of ovary    left     Allergies  Allergen Reactions  . Nitrofurantoin Monohyd Macro Nausea Only    Current Outpatient Medications on File Prior to Visit  Medication Sig  . aspirin 81 MG tablet Take 81 mg by mouth daily.  Marland Kitchen atorvastatin (LIPITOR) 80 MG tablet TAKE ONE TABLET EACH DAY FOR CHOLESTEROL  . Calcium Carb-Cholecalciferol (CALTRATE 600+D3 SOFT) 600-800 MG-UNIT CHEW Chew 1 Units by mouth daily.  . Cholecalciferol (VITAMIN D PO) Take 2,000  Units by mouth. Takes 10000 iu daily  . hydrochlorothiazide (HYDRODIURIL) 25 MG tablet Take 1 tablet (25 mg total) by mouth daily.  . Magnesium 250 MG TABS Take by mouth daily.  . Multiple Vitamin (MULTIVITAMIN) tablet Take 1 tablet by mouth daily.     No current facility-administered medications on file prior to visit.   Body mass index is 33.29 kg/m.   She has not been working on diet and exercise at this time.  She has been prescribed phentermine in the past, but is not currently taking this.  Her blood pressure has been  controlled and today her BP is 132/88.  She does not workout. She denies chest pain, shortness of breath, dizziness.  Lipid Panel     Component Value Date/Time   CHOL 187 08/20/2020 1642   TRIG 148 08/20/2020 1642   HDL 61 08/20/2020 1642   CHOLHDL 3.1 08/20/2020 1642   VLDL 15 04/22/2017 0930   LDLCALC 101 (H) 08/20/2020 1642   She has not been working on diet and exercise for Diabetes , she is2 on bASA, she is not on ACE/ARB, and denies paresthesia of the feet, polydipsia, polyuria and visual disturbances. Last A1C was:  Lab Results  Component Value Date   HGBA1C 6.1 (H) 08/20/2020    ROS: all negative except above.   Physical Exam:  BP 124/78   Pulse 65   Temp (!) 97.3 F (36.3 C)   Wt 182 lb (82.6 kg)   SpO2 97%   BMI 33.29 kg/m   General Appearance: Well nourished, in no apparent distress. Eyes: PERRLA, EOMs, conjunctiva no swelling or erythema Sinuses: No Frontal/maxillary tenderness ENT/Mouth: Ext aud canals clear, TMs without erythema, bulging. No erythema, swelling, or exudate on post pharynx.  Tonsils not swollen or erythematous. Hearing normal.  Neck: Supple, thyroid normal.  Respiratory: Respiratory effort normal, BS equal bilaterally without rales, rhonchi, wheezing or stridor.  Cardio: RRR with no MRGs. Brisk peripheral pulses. +1 pitting edema to bilateral lower extremities.  Abdomen: Soft, + BS.  Non tender, no guarding, rebound, hernias, masses. Lymphatics: Non tender without lymphadenopathy.  Musculoskeletal: Full ROM, 5/5 strength, normal gait.  Skin: Warm, dry without rashes, lesions, ecchymosis.  Neuro: Cranial nerves intact. Normal muscle tone, no cerebellar symptoms. Sensation intact.  Psych: Awake and oriented X 3, normal affect, Insight and Judgment appropriate.      Garnet Sierras, Laqueta Jean, DNP [facility] 09/26/2020  5:27 PM

## 2020-10-01 ENCOUNTER — Encounter: Payer: Medicare Other | Admitting: Adult Health Nurse Practitioner

## 2020-10-16 ENCOUNTER — Ambulatory Visit (INDEPENDENT_AMBULATORY_CARE_PROVIDER_SITE_OTHER): Payer: Medicare Other | Admitting: Adult Health Nurse Practitioner

## 2020-10-16 ENCOUNTER — Other Ambulatory Visit: Payer: Self-pay

## 2020-10-16 ENCOUNTER — Encounter: Payer: Self-pay | Admitting: Adult Health Nurse Practitioner

## 2020-10-16 VITALS — BP 120/72 | HR 81 | Temp 97.5°F | Ht 62.0 in | Wt 186.0 lb

## 2020-10-16 DIAGNOSIS — Z86000 Personal history of in-situ neoplasm of breast: Secondary | ICD-10-CM

## 2020-10-16 DIAGNOSIS — Z79899 Other long term (current) drug therapy: Secondary | ICD-10-CM

## 2020-10-16 DIAGNOSIS — R6889 Other general symptoms and signs: Secondary | ICD-10-CM | POA: Diagnosis not present

## 2020-10-16 DIAGNOSIS — Z0001 Encounter for general adult medical examination with abnormal findings: Secondary | ICD-10-CM

## 2020-10-16 DIAGNOSIS — E782 Mixed hyperlipidemia: Secondary | ICD-10-CM | POA: Diagnosis not present

## 2020-10-16 DIAGNOSIS — Z136 Encounter for screening for cardiovascular disorders: Secondary | ICD-10-CM | POA: Diagnosis not present

## 2020-10-16 DIAGNOSIS — E669 Obesity, unspecified: Secondary | ICD-10-CM

## 2020-10-16 DIAGNOSIS — E559 Vitamin D deficiency, unspecified: Secondary | ICD-10-CM

## 2020-10-16 DIAGNOSIS — R7309 Other abnormal glucose: Secondary | ICD-10-CM

## 2020-10-16 DIAGNOSIS — Z0000 Encounter for general adult medical examination without abnormal findings: Secondary | ICD-10-CM

## 2020-10-16 DIAGNOSIS — I10 Essential (primary) hypertension: Secondary | ICD-10-CM

## 2020-10-16 DIAGNOSIS — E66811 Obesity, class 1: Secondary | ICD-10-CM

## 2020-10-16 NOTE — Progress Notes (Signed)
LMOM that all lab results were normal and if she has any questions to call our office. -e welch

## 2020-10-16 NOTE — Patient Instructions (Addendum)
Shawna [name] , Thank you for taking time to come for your Medicare Wellness Visit. I appreciate your ongoing commitment to your health goals. Please review the following plan we discussed and let me know if I can assist you in the future.   These are the goals we discussed: Goals    . Blood Pressure < 130/70    . Exercise 3x per week (15-30 min per time)    . HEMOGLOBIN A1C < 5.7    . Weight (lb) < 165 lb (74.8 kg)       This is a list of the screening recommended for you and due dates:  Health Maintenance  Topic Date Due  . Tetanus Vaccine  09/14/2019  . Mammogram  12/17/2021  . Colon Cancer Screening  06/18/2023  . Flu Shot  Completed  . DEXA scan (bone density measurement)  DUE   . COVID-19 Vaccine  Completed  .  Hepatitis C: One time screening is recommended by Center for Disease Control  (CDC) for  adults born from [birth year] through 1965.   Completed  . Pneumonia vaccines  Completed  . Pap Smear  Discontinued    We will call you with your lab results.  You can get Archivist at [facility] on [address].    Call [place] [phone]   They can schedule you for COVID Booster [address] [place], [place] [address]      After the new year consider getting shingles vaccination.  Ask insurance and pharmacy about shingrix - it is a 2 part shot that we will not be getting in the office.   Suggest getting AFTER covid vaccines, have to wait at least a month This shot can make you feel bad due to such good immune response it can trigger some inflammation so take tylenol or aleve day of or day after and plan on resting.   Can go to [email or web address] for more information  Shingrix Vaccination  Two vaccines are licensed and recommended to prevent shingles in the [place] Zoster vaccine live (ZVL, Zostavax) has been in use since 2006. Recombinant zoster vaccine (RZV, Shingrix), has been in use since 2017  and is recommended by ACIP as the preferred shingles vaccine.  What Everyone Should Know about Shingles Vaccine (Shingrix) One of the Recommended Vaccines by Disease Shingles vaccination is the only way to protect against shingles and postherpetic neuralgia (PHN), the most common complication from shingles. CDC recommends that healthy adults 50 years and older get two doses of the shingles vaccine called Shingrix (recombinant zoster vaccine), separated by 2 to 6 months, to prevent shingles and the complications from the disease. Your doctor or pharmacist can give you Shingrix as a shot in your upper arm. Shingrix provides strong protection against shingles and PHN. Two doses of Shingrix is more than 90% effective at preventing shingles and PHN. Protection stays above 85% for at least the first four years after you get vaccinated. Shingrix is the preferred vaccine, over Zostavax (zoster vaccine live), a shingles vaccine in use since 2006. Zostavax may still be used to prevent shingles in healthy adults 60 years and older. For example, you could use Zostavax if a person is allergic to Shingrix, prefers Zostavax, or requests immediate vaccination and Shingrix is unavailable. Who Should Get Shingrix? Healthy adults 50 years and older should get two doses of Shingrix, separated by 2 to 6 months. You should get Shingrix even if in the past you . had shingles  . received  Zostavax  . are not sure if you had chickenpox There is no maximum age for getting Shingrix. If you had shingles in the past, you can get Shingrix to help prevent future occurrences of the disease. There is no specific length of time that you need to wait after having shingles before you can receive Shingrix, but generally you should make sure the shingles rash has gone away before getting vaccinated. You can get Shingrix whether or not you remember having had chickenpox in the past. Studies show that more than 99% of Americans 40 years and  older have had chickenpox, even if they don't remember having the disease. Chickenpox and shingles are related because they are caused by the same virus (varicella zoster virus). After a person recovers from chickenpox, the virus stays dormant (inactive) in the body. It can reactivate years later and cause shingles. If you had Zostavax in the recent past, you should wait at least eight weeks before getting Shingrix. Talk to your healthcare provider to determine the best time to get Shingrix. Shingrix is available in [employer] and pharmacies. To find doctor's offices or pharmacies near you that offer the vaccine, visit [employer] Vaccine FinderExternal. If you have questions about Shingrix, talk with your healthcare provider. Vaccine for Those 50 Years and Older  Shingrix reduces the risk of shingles and PHN by more than 90% in people 58 and older. CDC recommends the vaccine for healthy adults 60 and older.  Who Should Not Get Shingrix? You should not get Shingrix if you: . have ever had a severe allergic reaction to any component of the vaccine or after a dose of Shingrix  . tested negative for immunity to varicella zoster virus. If you test negative, you should get chickenpox vaccine.  . currently have shingles  . currently are pregnant or breastfeeding. Women who are pregnant or breastfeeding should wait to get Shingrix.  Marland Kitchen receive specific antiviral drugs (acyclovir, famciclovir, or valacyclovir) 24 hours before vaccination (avoid use of these antiviral drugs for 14 days after vaccination)- zoster vaccine live only If you have a minor acute (starts suddenly) illness, such as a cold, you may get Shingrix. But if you have a moderate or severe acute illness, you should usually wait until you recover before getting the vaccine. This includes anyone with a temperature of 101.65F or higher. The side effects of the Shingrix are temporary, and usually last 2 to 3 days. While you may experience pain  for a few days after getting Shingrix, the pain will be less severe than having shingles and the complications from the disease. How Well Does Shingrix Work? Two doses of Shingrix provides strong protection against shingles and postherpetic neuralgia (PHN), the most common complication of shingles. . In adults 27 to 69 years old who got two doses, Shingrix was 97% effective in preventing shingles; among adults 70 years and older, Shingrix was 91% effective.  . In adults 57 to 69 years old who got two doses, Shingrix was 91% effective in preventing PHN; among adults 70 years and older, Shingrix was 89% effective. Shingrix protection remained high (more than 85%) in people 70 years and older throughout the four years following vaccination. Since your risk of shingles and PHN increases as you get older, it is important to have strong protection against shingles in your older years. Top of Page  What Are the Possible Side Effects of Shingrix? Studies show that Shingrix is safe. The vaccine helps your body create a strong defense against  shingles. As a result, you are likely to have temporary side effects from getting the shots. The side effects may affect your ability to do normal daily activities for 2 to 3 days. Most people got a sore arm with mild or moderate pain after getting Shingrix, and some also had redness and swelling where they got the shot. Some people felt tired, had muscle pain, a headache, shivering, fever, stomach pain, or nausea. About 1 out of 6 people who got Shingrix experienced side effects that prevented them from doing regular activities. Symptoms went away on their own in about 2 to 3 days. Side effects were more common in younger people. You might have a reaction to the first or second dose of Shingrix, or both doses. If you experience side effects, you may choose to take over-the-counter pain medicine such as ibuprofen or acetaminophen. If you experience side effects from Shingrix,  you should report them to the Vaccine Adverse Event Reporting System (VAERS). Your doctor might file this report, or you can do it yourself through the VAERS websiteExternal, or by calling [phone]. If you have any questions about side effects from Shingrix, talk with your doctor. The shingles vaccine does not contain thimerosal (a preservative containing mercury). Top of Page  When Should I See a Doctor Because of the Side Effects I Experience From Shingrix? In clinical trials, Shingrix was not associated with serious adverse events. In fact, serious side effects from vaccines are extremely rare. For example, for every 1 million doses of a vaccine given, only one or two people may have a severe allergic reaction. Signs of an allergic reaction happen within minutes or hours after vaccination and include hives, swelling of the face and throat, difficulty breathing, a fast heartbeat, dizziness, or weakness. If you experience these or any other life-threatening symptoms, see a doctor right away. Shingrix causes a strong response in your immune system, so it may produce short-term side effects more intense than you are used to from other vaccines. These side effects can be uncomfortable, but they are expected and usually go away on their own in 2 or 3 days. Top of Page  How Can I Pay For Shingrix? There are several ways shingles vaccine may be paid for: Medicare . Medicare Part D plans cover the shingles vaccine, but there may be a cost to you depending on your plan. There may be a copay for the vaccine, or you may need to pay in full then get reimbursed for a certain amount.  . Medicare Part B does not cover the shingles vaccine. Medicaid . Medicaid may or may not cover the vaccine. Contact your insurer to find out. Private health insurance . Many private health insurance plans will cover the vaccine, but there may be a cost to you depending on your plan. Contact your insurer to find out. Vaccine  assistance programs . Some pharmaceutical companies provide vaccines to eligible adults who cannot afford them. You may want to check with the vaccine manufacturer, [employer], about Shingrix. If you do not currently have health insurance, learn more about affordable health coverage optionsExternal. To find doctor's offices or pharmacies near you that offer the vaccine, visit [place]  Know what a healthy weight is for you (roughly BMI <25) and aim to maintain this  Aim for 7+ servings of fruits and vegetables daily  70-80+ fluid ounces of water or unsweet tea for healthy kidneys  Limit to max 1 drink of  alcohol per day; avoid smoking/tobacco  Limit animal fats in diet for cholesterol and heart health - choose grass fed whenever available  Avoid highly processed foods, and foods high in saturated/trans fats  Aim for low stress - take time to unwind and care for your mental health  Aim for 150 min of moderate intensity exercise weekly for heart health, and weights twice weekly for bone health  Aim for 7-9 hours of sleep daily

## 2020-10-16 NOTE — Progress Notes (Signed)
WELCOME TO MEDICARE ANNUAL WELLNESS VISIT AND 3 MONTH  Assessment:   Encounter for Medicare annual wellness exam Yearly  Essential hypertension - continue medications, DASH diet, exercise and monitor at home. Call if greater than 130/80.   History of ductal carcinoma in situ (DCIS) of breast, left, s/p resection UTD on mammograms Continue to monitor  Hyperlipidemia Continue medications:atorivastatin 80mg , half talet three days a week. Discussed dietary and exercise modifications Low fat diet  Abnormal glucose Discussed general issues about diabetes pathophysiology and management., Educational material distributed., Suggested low cholesterol diet., Encouraged aerobic exercise., Discussed foot care., Reminded to get yearly retinal exam. Check A1C annually; monitor weight and serum glucose trends;  CMP/GFR  Medication management CBC, CMP/GFR, magnesium   Obesity BMI 34 Long discussion about weight loss, diet, and exercise Discussed final goal weight and current weight loss goal (<165lb) Patient will work on high protein snacks Patient on phentermine with benefit and no SE, taking drug breaks; continue close follow up. Return in     Further disposition pending results if labs check today. Discussed med's effects and SE's.   Over 40 minutes of face to face interview, exam, counseling, chart review, and critical decision making was performed.   Future Appointments  Date Time Provider [place]  12/18/2020 10:00 AM [facility] MM 3 [facility]  02/03/2021  2:30 PM Unk Pinto, MD [facility] None  03/03/2021  2:00 PM Joseph Pierini, MD [facility] Mar[facility] Baumgarten  10/16/2021  2:00 PM Garnet Sierras, NP [facility] None     Plan:   During the course of the visit the patient was educated and counseled about appropriate screening and preventive services including:    Pneumococcal vaccine   Prevnar 13  Influenza vaccine  Td vaccine  Screening  electrocardiogram  Bone densitometry screening  Colorectal cancer screening  Diabetes screening  Glaucoma screening  Nutrition counseling   Advanced directives: requested   Subjective:  Shawna [name] is a 69 y.o. female who presents for Medicare Annual Wellness Visit and OV.     Patient is s/p lumpectomy of L breast DCIS in 2006. Continues with annual mammograms via breast center.   she has been on phentermine for weight loss in the past, no longer taking.  BMI is Body mass index is 34.02 kg/m., she is working on diet and exercise. Wt Readings from Last 3 Encounters:  10/16/20 186 lb (84.4 kg)  09/26/20 182 lb (82.6 kg)  09/12/20 189 lb (85.7 kg)   Her blood pressure has been controlled at home, today their BP is BP: 120/72  She does not workout but has part time job and takes care of her husband. She denies chest pain, shortness of breath, dizziness.   She is on cholesterol medication and denies myalgias. Her cholesterol is at goal. The cholesterol last visit was:   Lab Results  Component Value Date   CHOL 216 (H) 10/16/2020   HDL 65 10/16/2020   LDLCALC 128 (H) 10/16/2020   TRIG 124 10/16/2020   CHOLHDL 3.3 10/16/2020    She has been working on diet and exercise for prediabetes, and denies paresthesia of the feet, polydipsia, polyuria and visual disturbances. Last A1C in the office was:  Lab Results  Component Value Date   HGBA1C 6.1 (H) 10/16/2020   Last GFR: Lab Results  Component Value Date   [facility] 66 10/16/2020   Patient is on Vitamin D supplement.   Lab Results  Component Value Date   VD25OH 86 07/13/2019  Medication Review: Current Outpatient Medications on File Prior to Visit  Medication Sig Dispense Refill   aspirin 81 MG tablet Take 81 mg by mouth daily.     atorvastatin (LIPITOR) 80 MG tablet TAKE ONE TABLET EACH DAY FOR CHOLESTEROL 90 tablet 1   Calcium Carb-Cholecalciferol (CALTRATE 600+D3 SOFT) 600-800 MG-UNIT CHEW Chew 1 Units  by mouth daily.     Cholecalciferol (VITAMIN D PO) Take 2,000 Units by mouth. Takes 10000 iu daily     hydrochlorothiazide (HYDRODIURIL) 25 MG tablet Take 1 tablet (25 mg total) by mouth daily. 30 tablet 1   Magnesium 250 MG TABS Take by mouth daily.     Multiple Vitamin (MULTIVITAMIN) tablet Take 1 tablet by mouth daily.       No current facility-administered medications on file prior to visit.    Allergies  Allergen Reactions   Nitrofurantoin Monohyd Macro Nausea Only    Current Problems (verified) Patient Active Problem List   Diagnosis Date Noted   Medication management 09/05/2014   Obesity (BMI 30.0-34.9) 09/05/2014   Essential hypertension 11/24/2013   Hyperlipidemia 11/24/2013   Abnormal glucose 11/24/2013   Vitamin D deficiency 11/24/2013   DCIS (ductal carcinoma in situ) of breast, Left lumpectomy. 09/23/2005.     Screening Tests Immunization History  Administered Date(s) Administered   DTaP 06/26/1999   Influenza Split 09/05/2014, 10/04/2015   Influenza Whole 09/01/2013   Influenza, High Dose Seasonal PF 08/12/2017, 09/01/2018, 09/11/2019, 09/12/2020   Influenza-Unspecified 09/09/2016   PFIZER SARS-COV-2 Vaccination 03/04/2020, 03/26/2020   PPD Test 03/05/2014, 03/11/2015, 03/30/2016   Pneumococcal Conjugate-13 04/22/2017   Pneumococcal Polysaccharide-23 02/04/2006, 09/01/2018   Tdap 09/13/2009   Zoster 10/04/2015   Preventative care: Last colonoscopy: 06/17/2018, Q5 years, dad with colon cancer, Dr. Oletta Lamas 2024 Last mammogram: 12/2019 Last pap smear/pelvic exam: 23/2021 previously Dr Kendall/Dr. Phineas Real DEXA: 04/2018 - osteopenia, DUE discussed with pt Xray Cervical spine 2014  Prior vaccinations: TD or Tdap: 2010, declines, will get if needed Influenza: 09/2020  Pneumococcal: 2019 Prevnar13: 2018 Shingles/Zostavax: 2016   Names of Other Physician/Practitioners you currently use: 1. [facility] here for primary care 2. Dr. Delman Cheadle, eye doctor, last visit 2021, has upcoming appointment 3. Dr. Felipa Eth, dentist, last visit 2021, Q6 months   Patient Care Team: Unk Pinto, MD as PCP - General (Internal Medicine)  SURGICAL HISTORY She  has a past surgical history that includes Salpingoophorectomy (1978); Breast lumpectomy (Left, 2006); and Colonoscopy (N/A, 05/24/2013). FAMILY HISTORY Her family history includes Breast cancer in her paternal grandmother; Cancer in her sister; Colon cancer in her father; Diabetes in her sister; Heart disease in her sister; Hypertension in her mother. SOCIAL HISTORY She  reports that she has never smoked. She has never used smokeless tobacco. She reports that she does not drink alcohol and does not use drugs.   MEDICARE WELLNESS OBJECTIVES: Physical activity: Current Exercise Habits: The patient does not participate in regular exercise at present, Exercise limited by: None identified Cardiac risk factors: Cardiac Risk Factors include: advanced age (>61men, >54 women);dyslipidemia;obesity (BMI >30kg/m2);sedentary lifestyle Depression/mood screen:   Depression screen [facility] 2/9 10/16/2020  Decreased Interest 0  Down, Depressed, Hopeless 0  PHQ - 2 Score 0    ADLs:  In your present state of health, do you have any difficulty performing the following activities: 10/16/2020  Hearing? N  Vision? N  Difficulty concentrating or making decisions? N  Walking or climbing stairs? N  Dressing or bathing? N  Doing errands,  shopping? N  Preparing Food and eating ? N  Using the Toilet? N  In the past six months, have you accidently leaked urine? N  Do you have problems with loss of bowel control? N  Managing your Medications? N  Managing your Finances? N  Housekeeping or managing your Housekeeping? N  Some recent data might be hidden     Cognitive Testing  Alert? Yes  Normal Appearance?Yes  Oriented to person? Yes  Place? Yes   Time?  Yes  Recall of three objects?  Yes  Can perform simple calculations? Yes  Displays appropriate judgment?Yes  Can read the correct time from a watch face?Yes  EOL planning:    Review of Systems  Constitutional: Negative.  Negative for malaise/fatigue and weight loss.  HENT: Negative.  Negative for hearing loss and tinnitus.   Eyes: Negative.  Negative for blurred vision and double vision.  Respiratory: Negative.  Negative for cough, sputum production, shortness of breath and wheezing.   Cardiovascular: Negative.  Negative for chest pain, palpitations, orthopnea, claudication, leg swelling and PND.  Gastrointestinal: Negative.  Negative for abdominal pain, blood in stool, constipation, diarrhea, heartburn, melena, nausea and vomiting.  Genitourinary: Negative.   Musculoskeletal: Negative.  Negative for falls, joint pain and myalgias.  Skin: Negative.  Negative for rash.  Neurological: Negative.  Negative for dizziness, tingling, sensory change, weakness and headaches.  Endo/Heme/Allergies: Negative.  Negative for polydipsia.  Psychiatric/Behavioral: Negative.  Negative for depression, memory loss, substance abuse and suicidal ideas. The patient is not nervous/anxious and does not have insomnia.   All other systems reviewed and are negative.    Objective:     Today's Vitals   10/16/20 1408  BP: 120/72  Pulse: 81  Temp: (!) 97.5 F (36.4 C)  SpO2: 98%  Weight: 186 lb (84.4 kg)  Height: 5\' 2"  (1.575 m)  PainSc: 0-No pain   Body mass index is 34.02 kg/m.  General appearance: alert, no distress, WD/WN, female HEENT: normocephalic, sclerae anicteric, TMs pearly, nares patent, no discharge or erythema, pharynx normal Oral cavity: MMM, no lesions Neck: supple, no lymphadenopathy, no thyromegaly, no masses Heart: RRR, normal S1, S2, no murmurs Lungs: CTA bilaterally, no wheezes, rhonchi, or rales Abdomen: +bs, soft, non tender, non distended, no masses, no hepatomegaly, no  splenomegaly Musculoskeletal: nontender, no swelling, no obvious deformity Extremities:  no cyanosis, no clubbing, nonpitting edema bil (per patient chronic, improved with compression) Pulses: 2+ symmetric, upper and lower extremities, normal cap refill Neurological: alert, oriented x 3, CN2-12 intact, strength normal upper extremities and lower extremities, sensation normal throughout, DTRs 2+ throughout, no cerebellar signs, gait normal Psychiatric: normal affect, behavior normal, pleasant    Medicare Attestation I have personally reviewed: The patient's medical and social history Their use of alcohol, tobacco or illicit drugs Their current medications and supplements The patient's functional ability including ADLs,fall risks, home safety risks, cognitive, and hearing and visual impairment Diet and physical activities Evidence for depression or mood disorders  The patient's weight, height, BMI, and visual acuity have been recorded in the chart.  I have made referrals, counseling, and provided education to the patient based on review of the above and I have provided the patient with a written personalized care plan for preventive services.     Garnet Sierras, NP   10/25/2020

## 2020-10-17 LAB — CBC WITH DIFFERENTIAL/PLATELET
Absolute Monocytes: 578 cells/uL (ref 200–950)
Basophils Absolute: 46 cells/uL (ref 0–200)
Basophils Relative: 0.6 %
Eosinophils Absolute: 208 cells/uL (ref 15–500)
Eosinophils Relative: 2.7 %
HCT: 39.8 % (ref 35.0–45.0)
Hemoglobin: 13.6 g/dL (ref 11.7–15.5)
Lymphs Abs: 2826 cells/uL (ref 850–3900)
MCH: 31.4 pg (ref 27.0–33.0)
MCHC: 34.2 g/dL (ref 32.0–36.0)
MCV: 91.9 fL (ref 80.0–100.0)
MPV: 10.5 fL (ref 7.5–12.5)
Monocytes Relative: 7.5 %
Neutro Abs: 4043 cells/uL (ref 1500–7800)
Neutrophils Relative %: 52.5 %
Platelets: 197 10*3/uL (ref 140–400)
RBC: 4.33 10*6/uL (ref 3.80–5.10)
RDW: 13.1 % (ref 11.0–15.0)
Total Lymphocyte: 36.7 %
WBC: 7.7 10*3/uL (ref 3.8–10.8)

## 2020-10-17 LAB — LIPID PANEL
Cholesterol: 216 mg/dL — ABNORMAL HIGH (ref <200)
HDL: 65 mg/dL (ref >=50)
LDL Cholesterol (Calc): 128 mg/dL (calc) — ABNORMAL HIGH
Non-HDL Cholesterol (Calc): 151 mg/dL (calc) — ABNORMAL HIGH (ref <130)
Total CHOL/HDL Ratio: 3.3 (calc) (ref <5.0)
Triglycerides: 124 mg/dL (ref <150)

## 2020-10-17 LAB — COMPLETE METABOLIC PANEL WITH GFR
AG Ratio: 1.8 (calc) (ref 1.0–2.5)
ALT: 22 U/L (ref 6–29)
AST: 24 U/L (ref 10–35)
Albumin: 4.4 g/dL (ref 3.6–5.1)
Alkaline phosphatase (APISO): 87 U/L (ref 37–153)
BUN: 24 mg/dL (ref 7–25)
CO2: 30 mmol/L (ref 20–32)
Calcium: 9.6 mg/dL (ref 8.6–10.4)
Chloride: 105 mmol/L (ref 98–110)
Creat: 0.89 mg/dL (ref 0.50–0.99)
GFR, Est African American: 77 mL/min/{1.73_m2} (ref >=60)
GFR, Est Non African American: 66 mL/min/{1.73_m2} (ref >=60)
Globulin: 2.4 g/dL (calc) (ref 1.9–3.7)
Glucose, Bld: 91 mg/dL (ref 65–99)
Potassium: 4.3 mmol/L (ref 3.5–5.3)
Sodium: 144 mmol/L (ref 135–146)
Total Bilirubin: 0.5 mg/dL (ref 0.2–1.2)
Total Protein: 6.8 g/dL (ref 6.1–8.1)

## 2020-10-17 LAB — HEMOGLOBIN A1C
Hgb A1c MFr Bld: 6.1 % of total Hgb — ABNORMAL HIGH (ref <5.7)
Mean Plasma Glucose: 128 (calc)
eAG (mmol/L): 7.1 (calc)

## 2020-10-18 ENCOUNTER — Other Ambulatory Visit: Payer: Self-pay | Admitting: Internal Medicine

## 2020-10-18 DIAGNOSIS — Z1231 Encounter for screening mammogram for malignant neoplasm of breast: Secondary | ICD-10-CM

## 2020-11-05 DIAGNOSIS — Z23 Encounter for immunization: Secondary | ICD-10-CM | POA: Diagnosis not present

## 2020-11-13 ENCOUNTER — Other Ambulatory Visit: Payer: Self-pay | Admitting: Adult Health Nurse Practitioner

## 2020-11-13 DIAGNOSIS — I10 Essential (primary) hypertension: Secondary | ICD-10-CM

## 2020-11-13 DIAGNOSIS — R600 Localized edema: Secondary | ICD-10-CM

## 2020-12-18 ENCOUNTER — Ambulatory Visit
Admission: RE | Admit: 2020-12-18 | Discharge: 2020-12-18 | Disposition: A | Discharge status: Home / Self Care | Payer: Medicare Other | Source: Ambulatory Visit | Attending: Internal Medicine | Admitting: Internal Medicine

## 2020-12-18 ENCOUNTER — Other Ambulatory Visit: Payer: Self-pay

## 2020-12-18 DIAGNOSIS — Z1231 Encounter for screening mammogram for malignant neoplasm of breast: Secondary | ICD-10-CM

## 2021-02-02 ENCOUNTER — Encounter: Payer: Self-pay | Admitting: Internal Medicine

## 2021-02-02 NOTE — Progress Notes (Signed)
Asth bronchitis       History of Present Illness:       This very nice 70 y.o. recently widowed WF  presents for 6 month follow up with HTN, HLD, Pre-Diabetes and Vitamin D Deficiency. Patient has hx/o Lt breast Lumpectomy in 2006.        Patient is treated for HTN  (2010) & BP has been controlled at home. Today's BP is elevated at 158/84 which patient attributes to "white coat HTN" and declines therapy at this time. Patient has had no complaints of any cardiac type chest pain, palpitations, dyspnea / orthopnea / PND, dizziness, claudication, or dependent edema.       Hyperlipidemia is not controlled with diet & Atorvastatin. Patient denies myalgias or other med SE's. Last Lipids were not at goal:  Lab Results  Component Value Date   CHOL 216 (H) 10/16/2020   HDL 65 10/16/2020   LDLCALC 128 (H) 10/16/2020   TRIG 124 10/16/2020   CHOLHDL 3.3 10/16/2020     Also, the patient has history of  Morbid Obesity  (BMI 33+) & history of PreDiabetes (A1c 5.8% /2010 and 6.1% /2011) and has had no symptoms of reactive hypoglycemia, diabetic polys, paresthesias or visual blurring.  Last A1c was not at goal:  Lab Results  Component Value Date   HGBA1c 6.1 (H) 10/16/2020            Further, the patient also has history of Vitamin D Deficiency ("36" /2008) and supplements vitamin D without any suspected side-effects. Last vitamin D was at goal:   Lab Results  Component Value Date   VD25OH 86 07/13/2019    Current Outpatient Medications on File Prior to Visit  Medication Sig  . aspirin 81 MG tab Take  daily.  Marland Kitchen atorvastatin  80 MG tab TAKE ONE TABLET EACH DAY   . Calcium/Vit D 600 mg /800 u Chew  daily.  Marland Kitchen VITAMIN D   Takes 10000 iu daily  . hydrochlorothiazide  25 MG tab TAKE ONE TABLET DAILY  . Magnesium 250 MG TAB Take  daily.  . Multi-Vit  Take 1 tablet daily.    Allergies  Allergen Reactions  . Nitrofurantoin Monohyd Macro Nausea Only    PMHx:   Past Medical History:   Diagnosis Date  . Cancer (HCC)    Breast  . 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 Hypercholesteremia   . Osteopenia 04/2018   T score -1.7.  Overall stable from prior DEXA  . Serous cyst of ovary    left    Immunization History  Administered Date(s) Administered  . DTaP 06/26/1999  . Influenza Split 09/05/2014, 10/04/2015  . Influenza Whole 09/01/2013  . Influenza, High Dose Seasonal PF 08/12/2017, 09/01/2018, 09/11/2019, 09/12/2020  . Influenza-Unspecified 09/09/2016  . PFIZER(Purple Top)SARS-COV-2 Vaccination 03/04/2020, 03/26/2020  . PPD Test 03/05/2014, 03/11/2015, 03/30/2016  . Pneumococcal Conjugate-13 04/22/2017  . Pneumococcal Polysaccharide-23 02/04/2006, 09/01/2018  . Tdap 09/13/2009  . Zoster 10/04/2015    Past Surgical History:  Procedure Laterality Date  . BREAST LUMPECTOMY Left 2006  . COLONOSCOPY N/A 05/24/2013   Dr Daine Gravel  . SALPINGOOPHORECTOMY  1978   left    FHx:    Reviewed / unchanged  SHx:    Reviewed / unchanged   Systems Review:  Constitutional: Denies fever, chills, wt changes, headaches, insomnia, fatigue, night sweats, change in appetite. Eyes: Denies redness, blurred vision, diplopia, discharge, itchy, watery eyes.  ENT:  Denies discharge, congestion, post nasal drip, epistaxis, sore throat, earache, hearing loss, dental pain, tinnitus, vertigo, sinus pain, snoring.  CV: Denies chest pain, palpitations, irregular heartbeat, syncope, dyspnea, diaphoresis, orthopnea, PND, claudication or edema. Respiratory: denies cough, dyspnea, DOE, pleurisy, hoarseness, laryngitis, wheezing.  Gastrointestinal: Denies dysphagia, odynophagia, heartburn, reflux, water brash, abdominal pain or cramps, nausea, vomiting, bloating, diarrhea, constipation, hematemesis, melena, hematochezia  or hemorrhoids. Genitourinary: Denies dysuria, frequency, urgency, nocturia, hesitancy, discharge, hematuria or flank pain. Musculoskeletal: Denies  arthralgias, myalgias, stiffness, jt. swelling, pain, limping or strain/sprain.  Skin: Denies pruritus, rash, hives, warts, acne, eczema or change in skin lesion(s). Neuro: No weakness, tremor, incoordination, spasms, paresthesia or pain. Psychiatric: Denies confusion, memory loss or sensory loss. Endo: Denies change in weight, skin or hair change.  Heme/Lymph: No excessive bleeding, bruising or enlarged lymph nodes.  Physical Exam  BP (!) 158/84   Pulse 88   Temp 97.6 F (36.4 C)   Resp 16   Ht 5' 2.5" (1.588 m)   Wt 182 lb 6.4 oz (82.7 kg)   SpO2 99%   BMI 32.83 kg/m   Appears  well nourished, well groomed  and in no distress.  Eyes: PERRLA, EOMs, conjunctiva no swelling or erythema. Sinuses: No frontal/maxillary tenderness ENT/Mouth: EAC's clear, TM's nl w/o erythema, bulging. Nares clear w/o erythema, swelling, exudates. Oropharynx clear without erythema or exudates. Oral hygiene is good. Tongue normal, non obstructing. Hearing intact.  Neck: Supple. Thyroid not palpable. Car 2+/2+ without bruits, nodes or JVD. Chest: Respirations nl with BS clear & equal w/o rales, rhonchi, wheezing or stridor.  Cor: Heart sounds normal w/ regular rate and rhythm without sig. murmurs, gallops, clicks or rubs. Peripheral pulses normal and equal  without edema.  Abdomen: Soft & bowel sounds normal. Non-tender w/o guarding, rebound, hernias, masses or organomegaly.  Lymphatics: Unremarkable.  Musculoskeletal: Full ROM all peripheral extremities, joint stability, 5/5 strength and normal gait.  Skin: Warm, dry without exposed rashes, lesions or ecchymosis apparent.  Neuro: Cranial nerves intact, reflexes equal bilaterally. Sensory-motor testing grossly intact. Tendon reflexes grossly intact.  Pysch: Alert & oriented x 3.  Insight and judgement nl & appropriate. No ideations.  Assessment and Plan:  1. Essential hypertension  - C Declines add'l meds at present and wil, monitor blood pressures at  home.  - Continue DASH diet.  Reminder to go to the ER if any CP,  SOB, nausea, dizziness, severe HA, changes vision/speech.  - CBC with Differential/Platelet - COMPLETE METABOLIC PANEL WITH GFR - Magnesium - TSH  2. Hyperlipidemia, mixed  - Continue diet/meds, exercise,& lifestyle modifications.  - Continue monitor periodic cholesterol/liver & renal functions   - Lipid panel - TSH  3. Abnormal glucose   - Continue diet, exercise  - Lifestyle modifications.  - Monitor appropriate labs.  - Hemoglobin A1c - Insulin, random  4. Vitamin D deficiency  - Continue supplementation.  - VITAMIN D 25 Hydroxyl  5. Medication management  - CBC with Differential/Platelet - COMPLETE METABOLIC PANEL WITH GFR - Magnesium - Lipid panel - TSH - Hemoglobin A1c - Insulin, random - VITAMIN D 25 Hydroxy          Discussed  regular exercise, BP monitoring, weight control to achieve/maintain BMI less than 25 and discussed med and SE's. Recommended labs to assess and monitor clinical status with further disposition pending results of labs.  I discussed the assessment and treatment plan with the patient. The patient was provided an opportunity to ask questions and all  were answered. The patient agreed with the plan and demonstrated an understanding of the instructions.  I provided over 30 minutes of exam, counseling, chart review and  complex critical decision making.        The patient was advised to call back or seek an in-person evaluation if the symptoms worsen or if the condition fails to improve as anticipated.   Kirtland Bouchard, MD

## 2021-02-02 NOTE — Patient Instructions (Signed)

## 2021-02-03 ENCOUNTER — Ambulatory Visit (INDEPENDENT_AMBULATORY_CARE_PROVIDER_SITE_OTHER): Payer: Medicare Other | Admitting: Internal Medicine

## 2021-02-03 ENCOUNTER — Other Ambulatory Visit: Payer: Self-pay

## 2021-02-03 VITALS — BP 158/84 | HR 88 | Temp 97.6°F | Resp 16 | Ht 62.5 in | Wt 182.4 lb

## 2021-02-03 DIAGNOSIS — Z79899 Other long term (current) drug therapy: Secondary | ICD-10-CM

## 2021-02-03 DIAGNOSIS — I10 Essential (primary) hypertension: Secondary | ICD-10-CM | POA: Diagnosis not present

## 2021-02-03 DIAGNOSIS — R7309 Other abnormal glucose: Secondary | ICD-10-CM | POA: Diagnosis not present

## 2021-02-03 DIAGNOSIS — E559 Vitamin D deficiency, unspecified: Secondary | ICD-10-CM | POA: Diagnosis not present

## 2021-02-03 DIAGNOSIS — J4520 Mild intermittent asthma, uncomplicated: Secondary | ICD-10-CM

## 2021-02-03 DIAGNOSIS — Z1152 Encounter for screening for COVID-19: Secondary | ICD-10-CM | POA: Diagnosis not present

## 2021-02-03 DIAGNOSIS — E782 Mixed hyperlipidemia: Secondary | ICD-10-CM

## 2021-02-03 LAB — POC COVID19 BINAXNOW: SARS Coronavirus 2 Ag: NEGATIVE

## 2021-02-03 MED ORDER — DEXAMETHASONE 4 MG PO TABS: ORAL_TABLET | ORAL | 0 refills | Status: DC

## 2021-02-03 MED ORDER — AZITHROMYCIN 250 MG PO TABS: ORAL_TABLET | ORAL | 1 refills | Status: DC

## 2021-02-04 ENCOUNTER — Other Ambulatory Visit: Payer: Self-pay | Admitting: Internal Medicine

## 2021-02-04 DIAGNOSIS — E782 Mixed hyperlipidemia: Secondary | ICD-10-CM

## 2021-02-04 LAB — COMPLETE METABOLIC PANEL WITH GFR
AG Ratio: 1.8 (calc) (ref 1.0–2.5)
ALT: 14 U/L (ref 6–29)
AST: 19 U/L (ref 10–35)
Albumin: 4.2 g/dL (ref 3.6–5.1)
Alkaline phosphatase (APISO): 80 U/L (ref 37–153)
BUN: 19 mg/dL (ref 7–25)
CO2: 29 mmol/L (ref 20–32)
Calcium: 9.9 mg/dL (ref 8.6–10.4)
Chloride: 102 mmol/L (ref 98–110)
Creat: 0.85 mg/dL (ref 0.50–0.99)
GFR, Est African American: 81 mL/min/{1.73_m2} (ref >=60)
GFR, Est Non African American: 70 mL/min/{1.73_m2} (ref >=60)
Globulin: 2.4 g/dL (calc) (ref 1.9–3.7)
Glucose, Bld: 94 mg/dL (ref 65–99)
Potassium: 3.7 mmol/L (ref 3.5–5.3)
Sodium: 142 mmol/L (ref 135–146)
Total Bilirubin: 0.5 mg/dL (ref 0.2–1.2)
Total Protein: 6.6 g/dL (ref 6.1–8.1)

## 2021-02-04 LAB — CBC WITH DIFFERENTIAL/PLATELET
Absolute Monocytes: 783 cells/uL (ref 200–950)
Basophils Absolute: 36 cells/uL (ref 0–200)
Basophils Relative: 0.4 %
Eosinophils Absolute: 225 cells/uL (ref 15–500)
Eosinophils Relative: 2.5 %
HCT: 40.2 % (ref 35.0–45.0)
Hemoglobin: 13.9 g/dL (ref 11.7–15.5)
Lymphs Abs: 1935 cells/uL (ref 850–3900)
MCH: 31.2 pg (ref 27.0–33.0)
MCHC: 34.6 g/dL (ref 32.0–36.0)
MCV: 90.3 fL (ref 80.0–100.0)
MPV: 10.7 fL (ref 7.5–12.5)
Monocytes Relative: 8.7 %
Neutro Abs: 6021 cells/uL (ref 1500–7800)
Neutrophils Relative %: 66.9 %
Platelets: 178 10*3/uL (ref 140–400)
RBC: 4.45 10*6/uL (ref 3.80–5.10)
RDW: 12.9 % (ref 11.0–15.0)
Total Lymphocyte: 21.5 %
WBC: 9 10*3/uL (ref 3.8–10.8)

## 2021-02-04 LAB — LIPID PANEL
Cholesterol: 171 mg/dL (ref <200)
HDL: 58 mg/dL (ref >=50)
LDL Cholesterol (Calc): 90 mg/dL (calc)
Non-HDL Cholesterol (Calc): 113 mg/dL (calc) (ref <130)
Total CHOL/HDL Ratio: 2.9 (calc) (ref <5.0)
Triglycerides: 124 mg/dL (ref <150)

## 2021-02-04 LAB — HEMOGLOBIN A1C
Hgb A1c MFr Bld: 5.9 % of total Hgb — ABNORMAL HIGH (ref <5.7)
Mean Plasma Glucose: 123 mg/dL
eAG (mmol/L): 6.8 mmol/L

## 2021-02-04 LAB — TSH: TSH: 1.3 mIU/L (ref 0.40–4.50)

## 2021-02-04 LAB — VITAMIN D 25 HYDROXY (VIT D DEFICIENCY, FRACTURES): Vit D, 25-Hydroxy: 86 ng/mL (ref 30–100)

## 2021-02-04 LAB — INSULIN, RANDOM: Insulin: 19.4 u[IU]/mL

## 2021-02-04 LAB — MAGNESIUM: Magnesium: 2 mg/dL (ref 1.5–2.5)

## 2021-02-04 MED ORDER — ATORVASTATIN CALCIUM 80 MG PO TABS: ORAL_TABLET | ORAL | 0 refills | Status: DC

## 2021-02-04 NOTE — Progress Notes (Signed)
============================================================ ============================================================  -    Total Chol = 171   and   LDL Chol = 90 -  Both    Excellent   - Very low risk for Heart Attack  / Stroke ============================================================ ============================================================  -  A1c = 5.9% - getting a little better ( was 6.1%)                       Goal is less than 5.7 % which is back in the Normal nonDiabetic range  ============================================================ ============================================================  -  Vitamin D = 86 - Excellent ============================================================ ============================================================  -  All Else - CBC - Kidneys - Electrolytes - Liver - Magnesium & Thyroid    - all  Normal / OK ============================================================ ============================================================   - Keep up the [place] Work  ! ============================================================ ============================================================

## 2021-02-27 ENCOUNTER — Telehealth: Payer: Self-pay | Admitting: *Deleted

## 2021-02-27 NOTE — Telephone Encounter (Signed)
Returned call to patient regarding cough and congestion after finishing medication. Per Dr Melford Aase, patient should refill her Z-pak and can try OTC Phenylephrine 10 mg. Patient is aware.

## 2021-03-03 ENCOUNTER — Encounter: Payer: Medicare Other | Admitting: Obstetrics and Gynecology

## 2021-03-17 ENCOUNTER — Encounter: Payer: Self-pay | Admitting: Nurse Practitioner

## 2021-03-17 ENCOUNTER — Other Ambulatory Visit: Payer: Self-pay

## 2021-03-17 ENCOUNTER — Ambulatory Visit (INDEPENDENT_AMBULATORY_CARE_PROVIDER_SITE_OTHER): Payer: Medicare Other | Admitting: Nurse Practitioner

## 2021-03-17 VITALS — BP 124/80 | Ht 62.5 in | Wt 192.0 lb

## 2021-03-17 DIAGNOSIS — Z01419 Encounter for gynecological examination (general) (routine) without abnormal findings: Secondary | ICD-10-CM

## 2021-03-17 DIAGNOSIS — M8589 Other specified disorders of bone density and structure, multiple sites: Secondary | ICD-10-CM

## 2021-03-17 DIAGNOSIS — Z780 Asymptomatic menopausal state: Secondary | ICD-10-CM

## 2021-03-17 NOTE — Patient Instructions (Signed)
Health Maintenance After Age 70 After age 70, you are at a higher risk for certain long-term diseases and infections as well as injuries from falls. Falls are a major cause of broken bones and head injuries in people who are older than age 70. Getting regular preventive care can help to keep you healthy and well. Preventive care includes getting regular testing and making lifestyle changes as recommended by your health care provider. Talk with your health care provider about:  Which screenings and tests you should have. A screening is a test that checks for a disease when you have no symptoms.  A diet and exercise plan that is right for you. What should I know about screenings and tests to prevent falls? Screening and testing are the best ways to find a health problem early. Early diagnosis and treatment give you the best chance of managing medical conditions that are common after age 70. Certain conditions and lifestyle choices may make you more likely to have a fall. Your health care provider may recommend:  Regular vision checks. Poor vision and conditions such as cataracts can make you more likely to have a fall. If you wear glasses, make sure to get your prescription updated if your vision changes.  Medicine review. Work with your health care provider to regularly review all of the medicines you are taking, including over-the-counter medicines. Ask your health care provider about any side effects that may make you more likely to have a fall. Tell your health care provider if any medicines that you take make you feel dizzy or sleepy.  Osteoporosis screening. Osteoporosis is a condition that causes the bones to get weaker. This can make the bones weak and cause them to break more easily.  Blood pressure screening. Blood pressure changes and medicines to control blood pressure can make you feel dizzy.  Strength and balance checks. Your health care provider may recommend certain tests to check your  strength and balance while standing, walking, or changing positions.  Foot health exam. Foot pain and numbness, as well as not wearing proper footwear, can make you more likely to have a fall.  Depression screening. You may be more likely to have a fall if you have a fear of falling, feel emotionally low, or feel unable to do activities that you used to do.  Alcohol use screening. Using too much alcohol can affect your balance and may make you more likely to have a fall. What actions can I take to lower my risk of falls? General instructions  Talk with your health care provider about your risks for falling. Tell your health care provider if: ? You fall. Be sure to tell your health care provider about all falls, even ones that seem minor. ? You feel dizzy, sleepy, or off-balance.  Take over-the-counter and prescription medicines only as told by your health care provider. These include any supplements.  Eat a healthy diet and maintain a healthy weight. A healthy diet includes low-fat dairy products, low-fat (lean) meats, and fiber from whole grains, beans, and lots of fruits and vegetables. Home safety  Remove any tripping hazards, such as rugs, cords, and clutter.  Install safety equipment such as grab bars in bathrooms and safety rails on stairs.  Keep rooms and walkways well-lit. Activity  Follow a regular exercise program to stay fit. This will help you maintain your balance. Ask your health care provider what types of exercise are appropriate for you.  If you need a cane or walker,   use it as recommended by your health care provider.  Wear supportive shoes that have nonskid soles.   Lifestyle  Do not drink alcohol if your health care provider tells you not to drink.  If you drink alcohol, limit how much you have: ? 0-1 drink a day for women. ? 0-2 drinks a day for men.  Be aware of how much alcohol is in your drink. In the [place] one drink equals one typical bottle of beer (12  oz), one-half glass of wine (5 oz), or one shot of hard liquor (1 oz).  Do not use any products that contain nicotine or tobacco, such as cigarettes and e-cigarettes. If you need help quitting, ask your health care provider. Summary  Having a healthy lifestyle and getting preventive care can help to protect your health and wellness after age 70.  Screening and testing are the best way to find a health problem early and help you avoid having a fall. Early diagnosis and treatment give you the best chance for managing medical conditions that are more common for people who are older than age 70.  Falls are a major cause of broken bones and head injuries in people who are older than age 70. Take precautions to prevent a fall at home.  Work with your health care provider to learn what changes you can make to improve your health and wellness and to prevent falls. This information is not intended to replace advice given to you by your health care provider. Make sure you discuss any questions you have with your health care provider. Document Revised: 03/16/2019 Document Reviewed: 10/06/2017 Elsevier Patient Education  2021 Elsevier Inc.  

## 2021-03-17 NOTE — Progress Notes (Signed)
Shawna [name] 1951/06/18 [id number]   History:  70 y.o. G0 presents for breast and pelvic exam. No GYN complaints. Postmenopausal - no HRT, no bleeding. Normal pap and mammogram history. History of left DCIS, osteopenia.   Gynecologic History No LMP recorded. Patient is postmenopausal.   Contraception: post menopausal status  Health Maintenance Last Pap: No longer screening per guidelines Last mammogram: 12/18/2020. Results were: normal Last colonoscopy: 2019 Last Dexa: 04/24/2018 . Results were: T-score -1.7  Past medical history, past surgical history, family history and social history were all reviewed and documented in the EPIC chart. Widowed. Retired.  ROS:  A ROS was performed and pertinent positives and negatives are included.  Exam:  Vitals:   03/17/21 1338  BP: 124/80  Weight: 192 lb (87.1 kg)  Height: 5' 2.5" (1.588 m)   Body mass index is 34.56 kg/m.  General appearance:  Normal Thyroid:  Symmetrical, normal in size, without palpable masses or nodularity. Respiratory  Auscultation:  Clear without wheezing or rhonchi Cardiovascular  Auscultation:  Regular rate, without rubs, murmurs or gallops  Edema/varicosities:  Not grossly evident Abdominal  Soft,nontender, without masses, guarding or rebound.  Liver/spleen:  No organomegaly noted  Hernia:  None appreciated  Skin  Inspection:  Grossly normal   Breasts: Examined lying and sitting.   Right: Without masses, retractions, discharge or axillary adenopathy.   Left: Without masses, retractions, discharge or axillary adenopathy. Gentitourinary   Inguinal/mons:  Normal without inguinal adenopathy  External genitalia:  Normal  BUS/Urethra/Skene's glands:  Normal  Vagina:  Atrophic changes  Cervix:  Normal  Uterus:  Difficult to palpate due to body habitus but no gross masses or tenderness  Adnexa/parametria:     Rt: Without masses or tenderness.   Lt: Without masses or tenderness.  Anus and  perineum: Normal  Digital rectal exam: Normal sphincter tone without palpated masses or tenderness  Assessment/Plan:  70 y.o. G0 for breast and pelvic exam.   Well female exam with routine gynecological exam - Education provided on SBEs, importance of preventative screenings, current guidelines, high calcium diet, regular exercise, and multivitamin daily. Labs with PCP.   Postmenopausal - no HRT, no bleeding.   Osteopenia of multiple sites - 04/2018 T-score -1.7. Recommend repeating bone density now. Continue Vitamin D supplement and increase exercise.   Screening for cervical cancer - Normal Pap history.  Discussed current guidelines and option to stop screening. We will reassess at 3-year interval as patient is unsure.   Screening for breast cancer - History of left DCIS. Continue annual screenings.  Normal breast exam today.  Screening for colon cancer - 2019 colonoscopy. Will repeat at GI's recommended interval.   Return in 1 year for annual.    [name] DNP, 2:00 PM 03/17/2021

## 2021-03-18 ENCOUNTER — Other Ambulatory Visit: Payer: Self-pay

## 2021-03-18 DIAGNOSIS — M8589 Other specified disorders of bone density and structure, multiple sites: Secondary | ICD-10-CM

## 2021-03-18 DIAGNOSIS — M8580 Other specified disorders of bone density and structure, unspecified site: Secondary | ICD-10-CM

## 2021-04-01 ENCOUNTER — Ambulatory Visit (INDEPENDENT_AMBULATORY_CARE_PROVIDER_SITE_OTHER): Payer: Medicare Other

## 2021-04-01 ENCOUNTER — Other Ambulatory Visit: Payer: Self-pay | Admitting: Nurse Practitioner

## 2021-04-01 ENCOUNTER — Other Ambulatory Visit: Payer: Self-pay

## 2021-04-01 DIAGNOSIS — M8589 Other specified disorders of bone density and structure, multiple sites: Secondary | ICD-10-CM | POA: Diagnosis not present

## 2021-04-01 DIAGNOSIS — Z780 Asymptomatic menopausal state: Secondary | ICD-10-CM

## 2021-04-01 DIAGNOSIS — M8580 Other specified disorders of bone density and structure, unspecified site: Secondary | ICD-10-CM

## 2021-05-02 NOTE — Progress Notes (Signed)
3 MONTH FOLLOW UP  Assessment:    Essential hypertension - has been off of HCTZ, ? Labile, start keeping consistent log at home, restart 1/2 tab HCTZ if persistently above 130/80 - DASH diet, exercise and monitor at home. Call if greater than 130/80.  - CMP/GFR - Magnesium  Hyperlipidemia Continue medications: atorivastatin 80mg  Discussed dietary and exercise modifications Low fat diet - Lipid panel - TSH  Abnormal glucose (prediabetes) Discussed general issues about diabetes pathophysiology and management., Educational material distributed., Suggested low cholesterol diet., Encouraged aerobic exercise., Discussed foot care., Reminded to get yearly retinal exam. Check A1C q25m; monitor weight and serum glucose trends;  - CMP/GFR  Medication management CBC, CMP/GFR, magnesium   Obesity - BMI 34 Long discussion about weight loss, diet, and exercise Discussed final goal weight and current weight loss goal (<165lb) Patient will work on high protein snacks or do fruit, avoid processed carbohydrate, avoid habitual snacking   Further disposition pending results if labs check today. Discussed med's effects and SE's.   Over 40 minutes of face to face interview, exam, counseling, chart review, and critical decision making was performed.   Future Appointments  Date Time Provider [place]  08/12/2021  2:30 PM Unk Pinto, MD [facility] None  10/16/2021  2:00 PM Garnet Sierras, NP [facility] None     Subjective:  Shawna [name] is a 70 y.o. female who presents for 3 month follow up on htn, hyperlipidemia, prediabetes, obesity and vitamin D def.      Patient is s/p lumpectomy of L breast DCIS in 2006. Continues with annual mammograms via breast center.   BMI is Body mass index is 34.74 kg/m., she is working on diet, eating more vegetables and salads, avoids sweets, has been walking on the treadmill and peddle cycle. She does admit to snacking on a few crackers.  Wt  Readings from Last 3 Encounters:  05/06/21 193 lb (87.5 kg)  03/17/21 192 lb (87.1 kg)  02/03/21 182 lb 6.4 oz (82.7 kg)   Her blood pressure has been controlled at home (can't recall numbers), today their BP is BP: 138/80, hasn't been taking HCTZ.   She does not workout but has part time job and takes care of her husband. She denies chest pain, shortness of breath, dizziness.   She is on cholesterol medication (atorvastatin 80 mg daily) and denies myalgias. Her cholesterol is at goal. The cholesterol last visit was:   Lab Results  Component Value Date   CHOL 171 02/03/2021   HDL 58 02/03/2021   LDLCALC 90 02/03/2021   TRIG 124 02/03/2021   CHOLHDL 2.9 02/03/2021    She has been working on diet and exercise for prediabetes, and denies paresthesia of the feet, polydipsia, polyuria and visual disturbances. Last A1C in the office was:  Lab Results  Component Value Date   HGBA1C 5.9 (H) 02/03/2021   Last GFR: Lab Results  Component Value Date   GFRNONAA 70 02/03/2021   Patient is on Vitamin D supplement.   Lab Results  Component Value Date   VD25OH 86 02/03/2021         Medication Review: Current Outpatient Medications on File Prior to Visit  Medication Sig Dispense Refill  . aspirin 81 MG tablet Take 81 mg by mouth daily.    Marland Kitchen atorvastatin (LIPITOR) 80 MG tablet Take  1 tablet  Daily  for Cholesterol 90 tablet 0  . Calcium Carb-Cholecalciferol 600-800 MG-UNIT CHEW Chew 1 Units by mouth daily.    Marland Kitchen  Cholecalciferol (VITAMIN D PO) Take 2,000 Units by mouth. Takes 10000 iu daily    . Magnesium 250 MG TABS Take by mouth daily.    . Multiple Vitamin (MULTIVITAMIN) tablet Take 1 tablet by mouth daily.    . hydrochlorothiazide (HYDRODIURIL) 25 MG tablet TAKE ONE TABLET DAILY (Patient not taking: No sig reported) 30 tablet 1   No current facility-administered medications on file prior to visit.    Allergies  Allergen Reactions  . Nitrofurantoin Monohyd Macro Nausea Only     Current Problems (verified) Patient Active Problem List   Diagnosis Date Noted  . Medication management 09/05/2014  . Obesity (BMI 30.0-34.9) 09/05/2014  . Essential hypertension 11/24/2013  . Hyperlipidemia 11/24/2013  . Abnormal glucose (prediabetes) 11/24/2013  . Vitamin D deficiency 11/24/2013  . DCIS (ductal carcinoma in situ) of breast, Left lumpectomy. 09/23/2005.    SURGICAL HISTORY She  has a past surgical history that includes Salpingoophorectomy (1978); Breast lumpectomy (Left, 2006); and Colonoscopy (N/A, 05/24/2013). FAMILY HISTORY Her family history includes Breast cancer in her paternal grandmother; Cancer in her sister; Colon cancer in her father; Diabetes in her sister; Heart disease in her sister; Hypertension in her mother. SOCIAL HISTORY She  reports that she has never smoked. She has never used smokeless tobacco. She reports that she does not drink alcohol and does not use drugs.  Review of Systems  Constitutional: Negative.  Negative for malaise/fatigue and weight loss.  HENT: Negative.  Negative for hearing loss and tinnitus.   Eyes: Negative.  Negative for blurred vision and double vision.  Respiratory: Negative.  Negative for cough, sputum production, shortness of breath and wheezing.   Cardiovascular: Negative.  Negative for chest pain, palpitations, orthopnea, claudication, leg swelling and PND.  Gastrointestinal: Negative.  Negative for abdominal pain, blood in stool, constipation, diarrhea, heartburn, melena, nausea and vomiting.  Genitourinary: Negative.   Musculoskeletal: Negative.  Negative for falls, joint pain and myalgias.  Skin: Negative.  Negative for rash.  Neurological: Negative.  Negative for dizziness, tingling, sensory change, weakness and headaches.  Endo/Heme/Allergies: Negative.  Negative for polydipsia.  Psychiatric/Behavioral: Negative.  Negative for depression, memory loss, substance abuse and suicidal ideas. The patient is not  nervous/anxious and does not have insomnia.   All other systems reviewed and are negative.    Objective:     Today's Vitals   05/06/21 1437 05/06/21 1503  BP: (!) 148/88 138/80  Pulse: 79   Temp: (!) 96.3 F (35.7 C)   SpO2: 98%   Weight: 193 lb (87.5 kg)    Body mass index is 34.74 kg/m.  General appearance: alert, no distress, WD/WN, female HEENT: normocephalic, sclerae anicteric, TMs pearly, nares patent, no discharge or erythema, pharynx normal Oral cavity: MMM, no lesions Neck: supple, no lymphadenopathy, no thyromegaly, no masses Heart: RRR, normal S1, S2, no murmurs Lungs: CTA bilaterally, no wheezes, rhonchi, or rales Abdomen: +bs, soft, non tender, non distended, no masses, no hepatomegaly, no splenomegaly Musculoskeletal: nontender, no swelling, no obvious deformity Extremities:  no cyanosis, no clubbing, nonpitting edema bil (per patient chronic, improved with compression) Pulses: 2+ symmetric, upper and lower extremities, normal cap refill Neurological: alert, oriented x 3, CN2-12 intact, strength normal upper extremities and lower extremities, sensation normal throughout, DTRs 2+ throughout, no cerebellar signs, gait normal Psychiatric: normal affect, behavior normal, pleasant     Izora Ribas, NP   05/06/2021

## 2021-05-06 ENCOUNTER — Ambulatory Visit (INDEPENDENT_AMBULATORY_CARE_PROVIDER_SITE_OTHER): Payer: Medicare Other | Admitting: Adult Health

## 2021-05-06 ENCOUNTER — Encounter: Payer: Self-pay | Admitting: Adult Health

## 2021-05-06 ENCOUNTER — Other Ambulatory Visit: Payer: Self-pay

## 2021-05-06 VITALS — BP 138/80 | HR 79 | Temp 96.3°F | Wt 193.0 lb

## 2021-05-06 DIAGNOSIS — Z79899 Other long term (current) drug therapy: Secondary | ICD-10-CM | POA: Diagnosis not present

## 2021-05-06 DIAGNOSIS — R7309 Other abnormal glucose: Secondary | ICD-10-CM | POA: Diagnosis not present

## 2021-05-06 DIAGNOSIS — I10 Essential (primary) hypertension: Secondary | ICD-10-CM

## 2021-05-06 DIAGNOSIS — E66811 Obesity, class 1: Secondary | ICD-10-CM

## 2021-05-06 DIAGNOSIS — E559 Vitamin D deficiency, unspecified: Secondary | ICD-10-CM | POA: Diagnosis not present

## 2021-05-06 DIAGNOSIS — E782 Mixed hyperlipidemia: Secondary | ICD-10-CM

## 2021-05-06 DIAGNOSIS — E669 Obesity, unspecified: Secondary | ICD-10-CM | POA: Diagnosis not present

## 2021-05-06 NOTE — Patient Instructions (Addendum)
Goals    . Blood Pressure < 130/80    . Exercise 3x per week (15-30 min per time)    . HEMOGLOBIN A1C < 5.7    . Weight (lb) < 165 lb (74.8 kg)       Start taking 1/2 tab hydrochlorothiazide if blood pressure is above goal      HYPERTENSION INFORMATION  Monitor your blood pressure at home, please keep a record and bring that in with you to your next office visit.   Go to the ER if any CP, SOB, nausea, dizziness, severe HA, changes vision/speech  Testing/Procedures: HOW TO TAKE YOUR BLOOD PRESSURE:  Rest 5 minutes before taking your blood pressure.  Don't smoke or drink caffeinated beverages for at least 30 minutes before.  Take your blood pressure before (not after) you eat.  Sit comfortably with your back supported and both feet on the floor (don't cross your legs).  Elevate your arm to heart level on a table or a desk.  Use the proper sized cuff. It should fit smoothly and snugly around your bare upper arm. There should be enough room to slip a fingertip under the cuff. The bottom edge of the cuff should be 1 inch above the crease of the elbow.  Due to a recent study, SPRINT, we have changed our goal for the systolic or top blood pressure number. Ideally we want your top number at 120.  In the [facility] Trial, 5000 people were randomized to a goal BP of 120 and 5000 people were randomized to a goal BP of less than 140. The patients with the goal BP at 120 had LESS DEMENTIA, LESS HEART ATTACKS, AND LESS STROKES, AS WELL AS OVERALL DECREASED MORTALITY OR DEATH RATE.   There was another study that showed taking your blood pressure medications at night decrease cardiovascular events.  However if you are on a fluid pill, please take this in the morning.   If you are willing, our goal BP is the top number of 120.  Your most recent BP: BP: 138/80   Take your medications faithfully as instructed. Maintain a healthy weight. Get at least 150 minutes of aerobic exercise per  week. Minimize salt intake. Minimize alcohol intake  DASH Eating Plan DASH stands for "Dietary Approaches to Stop Hypertension." The DASH eating plan is a healthy eating plan that has been shown to reduce high blood pressure (hypertension). Additional health benefits may include reducing the risk of type 2 diabetes mellitus, heart disease, and stroke. The DASH eating plan may also help with weight loss. WHAT DO I NEED TO KNOW ABOUT THE DASH EATING PLAN? For the DASH eating plan, you will follow these general guidelines:  Choose foods with a percent daily value for sodium of less than 5% (as listed on the food label).  Use salt-free seasonings or herbs instead of table salt or sea salt.  Check with your health care provider or pharmacist before using salt substitutes.  Eat lower-sodium products, often labeled as "lower sodium" or "no salt added."  Eat fresh foods.  Eat more vegetables, fruits, and low-fat dairy products.  Choose whole grains. Look for the word "whole" as the first word in the ingredient list.  Choose fish and skinless chicken or [place] more often than red meat. Limit fish, poultry, and meat to 6 oz (170 g) each day.  Limit sweets, desserts, sugars, and sugary drinks.  Choose heart-healthy fats.  Limit cheese to 1 oz (28 g) per day.  Eat more home-cooked food and less restaurant, buffet, and fast food.  Limit fried foods.  Cook foods using methods other than frying.  Limit canned vegetables. If you do use them, rinse them well to decrease the sodium.  When eating at a restaurant, ask that your food be prepared with less salt, or no salt if possible. WHAT FOODS CAN I EAT? Seek help from a dietitian for individual calorie needs. Grains Whole grain or whole wheat bread. Brown rice. Whole grain or whole wheat pasta. Quinoa, bulgur, and whole grain cereals. Low-sodium cereals. Corn or whole wheat flour tortillas. Whole grain cornbread. Whole grain crackers.  Low-sodium crackers. Vegetables Fresh or frozen vegetables (raw, steamed, roasted, or grilled). Low-sodium or reduced-sodium tomato and vegetable juices. Low-sodium or reduced-sodium tomato sauce and paste. Low-sodium or reduced-sodium canned vegetables.  Fruits All fresh, canned (in natural juice), or frozen fruits. Meat and Other Protein Products Ground beef (85% or leaner), grass-fed beef, or beef trimmed of fat. Skinless chicken or [place]. Ground chicken or [place]. Pork trimmed of fat. All fish and seafood. Eggs. Dried beans, peas, or lentils. Unsalted nuts and seeds. Unsalted canned beans. Dairy Low-fat dairy products, such as skim or 1% milk, 2% or reduced-fat cheeses, low-fat ricotta or cottage cheese, or plain low-fat yogurt. Low-sodium or reduced-sodium cheeses. Fats and Oils Tub margarines without trans fats. Light or reduced-fat mayonnaise and salad dressings (reduced sodium). Avocado. Safflower, olive, or canola oils. Natural peanut or almond butter. Other Unsalted popcorn and pretzels. The items listed above may not be a complete list of recommended foods or beverages. Contact your dietitian for more options. WHAT FOODS ARE NOT RECOMMENDED? Grains White bread. White pasta. White rice. Refined cornbread. Bagels and croissants. Crackers that contain trans fat. Vegetables Creamed or fried vegetables. Vegetables in a cheese sauce. Regular canned vegetables. Regular canned tomato sauce and paste. Regular tomato and vegetable juices. Fruits Dried fruits. Canned fruit in light or heavy syrup. Fruit juice. Meat and Other Protein Products Fatty cuts of meat. Ribs, chicken wings, bacon, sausage, bologna, salami, chitterlings, fatback, hot dogs, bratwurst, and packaged luncheon meats. Salted nuts and seeds. Canned beans with salt. Dairy Whole or 2% milk, cream, half-and-half, and cream cheese. Whole-fat or sweetened yogurt. Full-fat cheeses or blue cheese. Nondairy creamers and whipped  toppings. Processed cheese, cheese spreads, or cheese curds. Condiments Onion and garlic salt, seasoned salt, table salt, and sea salt. Canned and packaged gravies. Worcestershire sauce. Tartar sauce. Barbecue sauce. Teriyaki sauce. Soy sauce, including reduced sodium. Steak sauce. Fish sauce. Oyster sauce. Cocktail sauce. Horseradish. Ketchup and mustard. Meat flavorings and tenderizers. Bouillon cubes. Hot sauce. Tabasco sauce. Marinades. Taco seasonings. Relishes. Fats and Oils Butter, stick margarine, lard, shortening, ghee, and bacon fat. Coconut, palm kernel, or palm oils. Regular salad dressings. Other Pickles and olives. Salted popcorn and pretzels. The items listed above may not be a complete list of foods and beverages to avoid. Contact your dietitian for more information. WHERE CAN I FIND MORE INFORMATION? National Heart, Lung, and Blood Institute: travelstabloid.com Document Released: 11/12/2011 Document Revised: 04/09/2014 Document Reviewed: 09/27/2013 [facility] Patient Information 2015 [place], [place]. This information is not intended to replace advice given to you by your health care provider. Make sure you discuss any questions you have with your health care provider.

## 2021-05-07 LAB — CBC WITH DIFFERENTIAL/PLATELET
Absolute Monocytes: 575 cells/uL (ref 200–950)
Basophils Absolute: 32 cells/uL (ref 0–200)
Basophils Relative: 0.4 %
Eosinophils Absolute: 97 cells/uL (ref 15–500)
Eosinophils Relative: 1.2 %
HCT: 41.7 % (ref 35.0–45.0)
Hemoglobin: 14 g/dL (ref 11.7–15.5)
Lymphs Abs: 2616 cells/uL (ref 850–3900)
MCH: 30.8 pg (ref 27.0–33.0)
MCHC: 33.6 g/dL (ref 32.0–36.0)
MCV: 91.6 fL (ref 80.0–100.0)
MPV: 10.5 fL (ref 7.5–12.5)
Monocytes Relative: 7.1 %
Neutro Abs: 4779 cells/uL (ref 1500–7800)
Neutrophils Relative %: 59 %
Platelets: 212 10*3/uL (ref 140–400)
RBC: 4.55 10*6/uL (ref 3.80–5.10)
RDW: 13.2 % (ref 11.0–15.0)
Total Lymphocyte: 32.3 %
WBC: 8.1 10*3/uL (ref 3.8–10.8)

## 2021-05-07 LAB — LIPID PANEL
Cholesterol: 201 mg/dL — ABNORMAL HIGH (ref <200)
HDL: 61 mg/dL (ref >=50)
LDL Cholesterol (Calc): 116 mg/dL (calc) — ABNORMAL HIGH
Non-HDL Cholesterol (Calc): 140 mg/dL (calc) — ABNORMAL HIGH (ref <130)
Total CHOL/HDL Ratio: 3.3 (calc) (ref <5.0)
Triglycerides: 128 mg/dL (ref <150)

## 2021-05-07 LAB — COMPLETE METABOLIC PANEL WITH GFR
AG Ratio: 2.1 (calc) (ref 1.0–2.5)
ALT: 16 U/L (ref 6–29)
AST: 23 U/L (ref 10–35)
Albumin: 4.6 g/dL (ref 3.6–5.1)
Alkaline phosphatase (APISO): 97 U/L (ref 37–153)
BUN: 19 mg/dL (ref 7–25)
CO2: 27 mmol/L (ref 20–32)
Calcium: 10.5 mg/dL — ABNORMAL HIGH (ref 8.6–10.4)
Chloride: 107 mmol/L (ref 98–110)
Creat: 0.81 mg/dL (ref 0.50–0.99)
GFR, Est African American: 86 mL/min/{1.73_m2} (ref >=60)
GFR, Est Non African American: 74 mL/min/{1.73_m2} (ref >=60)
Globulin: 2.2 g/dL (calc) (ref 1.9–3.7)
Glucose, Bld: 89 mg/dL (ref 65–99)
Potassium: 4.8 mmol/L (ref 3.5–5.3)
Sodium: 145 mmol/L (ref 135–146)
Total Bilirubin: 0.5 mg/dL (ref 0.2–1.2)
Total Protein: 6.8 g/dL (ref 6.1–8.1)

## 2021-05-07 LAB — MAGNESIUM: Magnesium: 2.2 mg/dL (ref 1.5–2.5)

## 2021-05-07 LAB — TSH: TSH: 1.74 mIU/L (ref 0.40–4.50)

## 2021-08-11 ENCOUNTER — Encounter: Payer: Self-pay | Admitting: Internal Medicine

## 2021-08-11 NOTE — Patient Instructions (Signed)

## 2021-08-11 NOTE — Progress Notes (Signed)
Future Appointments  Date Time Provider [place]  08/12/2021    -   6 mo OV   2:30 PM Unk Pinto, MD [facility] None  10/16/2021     - Wellness  2:00 PM Mull, Townsend Roger, NP [facility] None    History of Present Illness:       This very nice 70 y.o. Shawna [name]  (widowed 07/22/2020)  presents for 6 month follow up with HTN, HLD, Pre-Diabetes and Vitamin D Deficiency.   Patient has hx/o Lt breast Lumpectomy in 2006.       Patient is treated for HTN (2010) & BP has been controlled at home. Today's BP is elevated at 150/80 & rechecked x 2 at 175/98. Patient has had no complaints of any cardiac type chest pain, palpitations, dyspnea / orthopnea / PND, dizziness, claudication, or dependent edema.       Hyperlipidemia is controlled with diet & meds. Patient denies myalgias or other med SE's. Last Lipids were not at goal:  Lab Results  Component Value Date   CHOL 201 (H) 05/06/2021   HDL 61 05/06/2021   LDLCALC 116 (H) 05/06/2021   TRIG 128 05/06/2021   CHOLHDL 3.3 05/06/2021     Also, the patient has history of PreDiabetes  (A1c 5.8% / 2010 and 6.1% /2011)  and has had no symptoms of reactive hypoglycemia, diabetic polys, paresthesias or visual blurring.  Last A1c was not at goal:  Lab Results  Component Value Date   HGBA1C 5.9 (H) 02/03/2021        Further, the patient also has history of Vitamin D Deficiency ("36" /2008) and supplements vitamin D without any suspected side-effects. Last vitamin D was at goal:  Lab Results  Component Value Date   VD25OH 86 02/03/2021     Current Outpatient Medications on File Prior to Visit  Medication Sig   aspirin 81 MG tablet Take  daily.   atorvastatin 80 MG tablet Take  1 tablet  Daily    VITAMIN D  Takes 10,000 iu daily   Magnesium 250 MG TABS Take daily.   Multiple Vitamin  Take 1 tablet daily.     Allergies  Allergen Reactions   Nitrofurantoin Monohyd Macro Nausea Only     PMHx:   Past Medical History:  Diagnosis  Date   Cancer ([place])    Breast   DCIS (ductal carcinoma in situ) of breast    left   Hypercholesteremia    Osteopenia 04/2018   T score -1.7.  Overall stable from prior DEXA   Serous cyst of ovary    left     Immunization History  Administered Date(s) Administered   DTaP 06/26/1999   Influenza Split 09/05/2014, 10/04/2015   Influenza Whole 09/01/2013   Influenza, High Dose Seasonal PF 08/12/2017, 09/01/2018, 09/11/2019, 09/12/2020   Influenza 09/09/2016   PFIZER Comirnaty(Gray Top)Covid-19 Tri-Sucrose Vaccine 11/05/2020   PFIZER(Purple Top)SARS-COV-2 Vaccination 03/04/2020, 03/26/2020   PPD Test 03/05/2014, 03/11/2015, 03/30/2016   Pneumococcal Conjugate-13 04/22/2017   Pneumococcal Polysaccharide-23 02/04/2006, 09/01/2018   Tdap 09/13/2009   Zoster, Live 10/04/2015     Past Surgical History:  Procedure Laterality Date   BREAST LUMPECTOMY Left 2006   COLONOSCOPY N/A 05/24/2013   Dr Daine Gravel   SALPINGOOPHORECTOMY  1978   left    FHx:    Reviewed / unchanged  SHx:    Reviewed / unchanged   Systems Review:  Constitutional: Denies fever, chills, wt changes, headaches, insomnia, fatigue, night sweats, change  in appetite. Eyes: Denies redness, blurred vision, diplopia, discharge, itchy, watery eyes.  ENT: Denies discharge, congestion, post nasal drip, epistaxis, sore throat, earache, hearing loss, dental pain, tinnitus, vertigo, sinus pain, snoring.  CV: Denies chest pain, palpitations, irregular heartbeat, syncope, dyspnea, diaphoresis, orthopnea, PND, claudication or edema. Respiratory: denies cough, dyspnea, DOE, pleurisy, hoarseness, laryngitis, wheezing.  Gastrointestinal: Denies dysphagia, odynophagia, heartburn, reflux, water brash, abdominal pain or cramps, nausea, vomiting, bloating, diarrhea, constipation, hematemesis, melena, hematochezia  or hemorrhoids. Genitourinary: Denies dysuria, frequency, urgency, nocturia, hesitancy, discharge, hematuria or flank  pain. Musculoskeletal: Denies arthralgias, myalgias, stiffness, jt. swelling, pain, limping or strain/sprain.  Skin: Denies pruritus, rash, hives, warts, acne, eczema or change in skin lesion(s). Neuro: No weakness, tremor, incoordination, spasms, paresthesia or pain. Psychiatric: Denies confusion, memory loss or sensory loss. Endo: Denies change in weight, skin or hair change.  Heme/Lymph: No excessive bleeding, bruising or enlarged lymph nodes.  Physical Exam  BP (!) 150/80   Pulse 87   Temp (!) 97.5 F (36.4 C)   Wt 194 lb 6.4 oz (88.2 kg)   SpO2 96%   BMI 34.99 kg/m   Appears  well nourished, well groomed  and in no distress.  Eyes: PERRLA, EOMs, conjunctiva no swelling or erythema. Sinuses: No frontal/maxillary tenderness ENT/Mouth: EAC's clear, TM's nl w/o erythema, bulging. Nares clear w/o erythema, swelling, exudates. Oropharynx clear without erythema or exudates. Oral hygiene is good. Tongue normal, non obstructing. Hearing intact.  Neck: Supple. Thyroid not palpable. Car 2+/2+ without bruits, nodes or JVD. Chest: Respirations nl with BS clear & equal w/o rales, rhonchi, wheezing or stridor.  Cor: Heart sounds normal w/ regular rate and rhythm without sig. murmurs, gallops, clicks or rubs. Peripheral pulses normal and equal  without edema.  Abdomen: Soft & bowel sounds normal. Non-tender w/o guarding, rebound, hernias, masses or organomegaly.  Lymphatics: Unremarkable.  Musculoskeletal: Full ROM all peripheral extremities, joint stability, 5/5 strength and normal gait.  Skin: Warm, dry without exposed rashes, lesions or ecchymosis apparent.  Neuro: Cranial nerves intact, reflexes equal bilaterally. Sensory-motor testing grossly intact. Tendon reflexes grossly intact.  Pysch: Alert & oriented x 3.  Insight and judgement nl & appropriate. No ideations.  Assessment and Plan:  1. Essential hypertension  - Continue medication, monitor blood pressure at home.  - Continue  DASH diet.  Reminder to go to the ER if any CP,  SOB, nausea, dizziness, severe HA, changes vision/speech.   - CBC with Differential/Platelet - COMPLETE METABOLIC PANEL WITH GFR - Magnesium - TSH - bisoprolol-hctz 5-6.25 MG tablet;  Take  1 tablet  every Morning  for BP   Dispense: 90 tablet; Refill: 3  2. Hyperlipidemia, mixed  - Continue diet/meds, exercise,& lifestyle modifications.  - Continue monitor periodic cholesterol/liver & renal functions    - Lipid panel - TSH  3. Abnormal glucose  - Continue diet, exercise  - Lifestyle modifications.  - Monitor appropriate labs    - Hemoglobin A1c - Insulin, random  4. Class 2 severe obesity with body mass index  (BMI) of 35 to 39.9 with serious comorbidity (HCC)  - phentermine (ADIPEX-P) 37.5 MG tablet;  Take 1/2 to 1 tablet every Morning for Dieting & Weight Loss   Dispense: 90 tablet; Refill: 1  - topiramate (TOPAMAX) 50 MG tablet;  Take 1/2 to 1 tablet 2 x /day at Suppertime & Bedtime for Dieting & Weight Loss   Dispense: 180 tablet; Refill: 1  5. Vitamin D deficiency  - Continue supplementation.  -  VITAMIN D 25 Hydroxy   6. Medication management  - CBC with Differential/Platelet - COMPLETE METABOLIC PANEL WITH GFR - Magnesium - Lipid panel - TSH - Hemoglobin A1c - Insulin, random - VITAMIN D 25 Hydroxy         Discussed  regular exercise, BP monitoring, weight control to achieve/maintain BMI less than 25 and discussed med and SE's. Recommended labs to assess and monitor clinical status with further disposition pending results of labs.  I discussed the assessment and treatment plan with the patient. The patient was provided an opportunity to ask questions and all were answered. The patient agreed with the plan and demonstrated an understanding of the instructions.  I provided over 30 minutes of exam, counseling, chart review and  complex critical decision making.        The patient was advised to call back  or seek an in-person evaluation if the symptoms worsen or if the condition fails to improve as anticipated.   Kirtland Bouchard, MD

## 2021-08-12 ENCOUNTER — Ambulatory Visit (INDEPENDENT_AMBULATORY_CARE_PROVIDER_SITE_OTHER): Payer: Medicare Other | Admitting: Internal Medicine

## 2021-08-12 ENCOUNTER — Other Ambulatory Visit: Payer: Self-pay

## 2021-08-12 ENCOUNTER — Encounter: Payer: Self-pay | Admitting: Internal Medicine

## 2021-08-12 VITALS — BP 150/80 | HR 87 | Temp 97.5°F | Resp 12 | Ht 62.5 in | Wt 194.4 lb

## 2021-08-12 DIAGNOSIS — E559 Vitamin D deficiency, unspecified: Secondary | ICD-10-CM

## 2021-08-12 DIAGNOSIS — I10 Essential (primary) hypertension: Secondary | ICD-10-CM

## 2021-08-12 DIAGNOSIS — E782 Mixed hyperlipidemia: Secondary | ICD-10-CM

## 2021-08-12 DIAGNOSIS — Z79899 Other long term (current) drug therapy: Secondary | ICD-10-CM

## 2021-08-12 DIAGNOSIS — R7309 Other abnormal glucose: Secondary | ICD-10-CM

## 2021-08-12 MED ORDER — BISOPROLOL-HYDROCHLOROTHIAZIDE 5-6.25 MG PO TABS: ORAL_TABLET | ORAL | 3 refills | Status: DC

## 2021-08-12 MED ORDER — TOPIRAMATE 50 MG PO TABS
ORAL_TABLET | ORAL | 1 refills | Status: DC
Start: 2021-08-12 — End: 2021-12-06

## 2021-08-12 MED ORDER — PHENTERMINE HCL 37.5 MG PO TABS: ORAL_TABLET | ORAL | 1 refills | Status: DC

## 2021-08-13 ENCOUNTER — Other Ambulatory Visit: Payer: Self-pay | Admitting: Internal Medicine

## 2021-08-13 LAB — CBC WITH DIFFERENTIAL/PLATELET
Absolute Monocytes: 515 cells/uL (ref 200–950)
Basophils Absolute: 40 cells/uL (ref 0–200)
Basophils Relative: 0.6 %
Eosinophils Absolute: 99 cells/uL (ref 15–500)
Eosinophils Relative: 1.5 %
HCT: 40.1 % (ref 35.0–45.0)
Hemoglobin: 13.5 g/dL (ref 11.7–15.5)
Lymphs Abs: 2363 cells/uL (ref 850–3900)
MCH: 30.5 pg (ref 27.0–33.0)
MCHC: 33.7 g/dL (ref 32.0–36.0)
MCV: 90.7 fL (ref 80.0–100.0)
MPV: 10.3 fL (ref 7.5–12.5)
Monocytes Relative: 7.8 %
Neutro Abs: 3584 cells/uL (ref 1500–7800)
Neutrophils Relative %: 54.3 %
Platelets: 197 10*3/uL (ref 140–400)
RBC: 4.42 10*6/uL (ref 3.80–5.10)
RDW: 13.2 % (ref 11.0–15.0)
Total Lymphocyte: 35.8 %
WBC: 6.6 10*3/uL (ref 3.8–10.8)

## 2021-08-13 LAB — LIPID PANEL
Cholesterol: 186 mg/dL (ref <200)
HDL: 49 mg/dL — ABNORMAL LOW (ref >=50)
LDL Cholesterol (Calc): 115 mg/dL (calc) — ABNORMAL HIGH
Non-HDL Cholesterol (Calc): 137 mg/dL (calc) — ABNORMAL HIGH (ref <130)
Total CHOL/HDL Ratio: 3.8 (calc) (ref <5.0)
Triglycerides: 114 mg/dL (ref <150)

## 2021-08-13 LAB — COMPLETE METABOLIC PANEL WITH GFR
AG Ratio: 1.8 (calc) (ref 1.0–2.5)
ALT: 19 U/L (ref 6–29)
AST: 20 U/L (ref 10–35)
Albumin: 4.2 g/dL (ref 3.6–5.1)
Alkaline phosphatase (APISO): 87 U/L (ref 37–153)
BUN: 18 mg/dL (ref 7–25)
CO2: 27 mmol/L (ref 20–32)
Calcium: 10 mg/dL (ref 8.6–10.4)
Chloride: 106 mmol/L (ref 98–110)
Creat: 0.8 mg/dL (ref 0.60–1.00)
Globulin: 2.4 g/dL (calc) (ref 1.9–3.7)
Glucose, Bld: 86 mg/dL (ref 65–99)
Potassium: 4.5 mmol/L (ref 3.5–5.3)
Sodium: 142 mmol/L (ref 135–146)
Total Bilirubin: 0.6 mg/dL (ref 0.2–1.2)
Total Protein: 6.6 g/dL (ref 6.1–8.1)
eGFR: 79 mL/min/{1.73_m2} (ref >=60)

## 2021-08-13 LAB — MAGNESIUM: Magnesium: 2 mg/dL (ref 1.5–2.5)

## 2021-08-13 LAB — HEMOGLOBIN A1C
Hgb A1c MFr Bld: 6 % of total Hgb — ABNORMAL HIGH (ref <5.7)
Mean Plasma Glucose: 126 mg/dL
eAG (mmol/L): 7 mmol/L

## 2021-08-13 LAB — VITAMIN D 25 HYDROXY (VIT D DEFICIENCY, FRACTURES): Vit D, 25-Hydroxy: 71 ng/mL (ref 30–100)

## 2021-08-13 LAB — TSH: TSH: 1.48 mIU/L (ref 0.40–4.50)

## 2021-08-13 LAB — INSULIN, RANDOM: Insulin: 11.5 u[IU]/mL

## 2021-08-13 MED ORDER — ROSUVASTATIN CALCIUM 20 MG PO TABS: ORAL_TABLET | ORAL | 3 refills | Status: DC

## 2021-08-13 NOTE — Progress Notes (Signed)
============================================================ ============================================================  -    A1c = 6.0%  Blood sugar and A1c are STILL elevated in the borderline and  early or pre-diabetes range which has the same   300% increased risk for heart attack, stroke, cancer and   alzheimer- type vascular dementia as full blown diabetes.   But the good news is that diet, exercise with  weight loss can cure the early diabetes at this point. ============================================================ ============================================================  -  It is very important that you work harder with diet by avoiding  all foods that are white except chicken, fish & calliflower.  - Avoid white rice  (brown & wild rice is OK),   - Avoid white potatoes  (sweet potatoes in moderation is OK),   White bread or wheat bread or anything made out of   white flour like bagels, donuts, rolls, buns, biscuits, cakes,  - pastries, cookies, pizza crust, and pasta (made from  white flour & egg whites)   - vegetarian pasta or spinach or wheat pasta is OK.  - Multigrain breads like Arnold's, Pepperidge Farm or   multigrain sandwich thins or high fiber breads like   [place] bread or "Dave[employer] Killer" breads that are  4 to 5 grams fiber per slice !  are best.    Diet, exercise and weight loss can reverse and cure  diabetes in the early stages.   ============================================================ ============================================================  -  Total Chol = 186 great ,  - But   - The Bad / Dangerous LDL Chol = 115 - is too high              ( Ideal or Goal is less than 70  !  )   - So . . . . . Stop Atorvastatin  & sent new Rx for   Crestor to your Drugstore - to take 1 whole pill every day ============================================================ ============================================================  -  All Else  - CBC - Kidneys - Electrolytes - Liver - Magnesium & Thyroid    - all  Normal / OK  ============================================================ ============================================================

## 2021-08-26 DIAGNOSIS — N3001 Acute cystitis with hematuria: Secondary | ICD-10-CM | POA: Diagnosis not present

## 2021-08-26 DIAGNOSIS — R8290 Unspecified abnormal findings in urine: Secondary | ICD-10-CM | POA: Diagnosis not present

## 2021-09-04 ENCOUNTER — Other Ambulatory Visit: Payer: Self-pay

## 2021-09-04 ENCOUNTER — Other Ambulatory Visit: Payer: Self-pay | Admitting: Internal Medicine

## 2021-09-04 ENCOUNTER — Ambulatory Visit (INDEPENDENT_AMBULATORY_CARE_PROVIDER_SITE_OTHER): Payer: Medicare Other

## 2021-09-04 VITALS — BP 122/74 | HR 71 | Temp 97.9°F | Resp 17 | Ht 62.5 in | Wt 189.2 lb

## 2021-09-04 DIAGNOSIS — G4483 Primary cough headache: Secondary | ICD-10-CM | POA: Diagnosis not present

## 2021-09-04 DIAGNOSIS — R300 Dysuria: Secondary | ICD-10-CM

## 2021-09-04 LAB — POC COVID19 BINAXNOW: SARS Coronavirus 2 Ag: NEGATIVE

## 2021-09-04 MED ORDER — LEVOFLOXACIN 500 MG PO TABS: ORAL_TABLET | ORAL | 0 refills | Status: DC

## 2021-09-04 NOTE — Progress Notes (Signed)
The patient came in for COVID testing and UA/Culture due to ongoing symptoms after being treated for recent UTI.

## 2021-09-05 LAB — URINALYSIS, ROUTINE W REFLEX MICROSCOPIC
Bacteria, UA: NONE SEEN /HPF
Bilirubin Urine: NEGATIVE
Glucose, UA: NEGATIVE
Hgb urine dipstick: NEGATIVE
Hyaline Cast: NONE SEEN /LPF
Ketones, ur: NEGATIVE
Nitrite: NEGATIVE
Specific Gravity, Urine: 1.016 (ref 1.001–1.035)
pH: 5.5 (ref 5.0–8.0)

## 2021-09-05 LAB — URINE CULTURE
MICRO NUMBER:: 12440785
Result:: NO GROWTH
SPECIMEN QUALITY:: ADEQUATE

## 2021-09-06 NOTE — Progress Notes (Signed)
============================================================ ============================================================  -   U/C returned OK - Great - No Infection  ============================================================ ============================================================

## 2021-09-17 ENCOUNTER — Other Ambulatory Visit: Payer: Self-pay | Admitting: Internal Medicine

## 2021-09-17 DIAGNOSIS — Z1231 Encounter for screening mammogram for malignant neoplasm of breast: Secondary | ICD-10-CM

## 2021-09-22 ENCOUNTER — Ambulatory Visit (INDEPENDENT_AMBULATORY_CARE_PROVIDER_SITE_OTHER): Payer: Medicare Other

## 2021-09-22 ENCOUNTER — Other Ambulatory Visit: Payer: Self-pay

## 2021-09-22 VITALS — Temp 96.6°F

## 2021-09-22 DIAGNOSIS — Z23 Encounter for immunization: Secondary | ICD-10-CM | POA: Diagnosis not present

## 2021-09-29 DIAGNOSIS — H5213 Myopia, bilateral: Secondary | ICD-10-CM | POA: Diagnosis not present

## 2021-09-29 DIAGNOSIS — H2513 Age-related nuclear cataract, bilateral: Secondary | ICD-10-CM | POA: Diagnosis not present

## 2021-09-29 DIAGNOSIS — H52203 Unspecified astigmatism, bilateral: Secondary | ICD-10-CM | POA: Diagnosis not present

## 2021-09-29 DIAGNOSIS — H43812 Vitreous degeneration, left eye: Secondary | ICD-10-CM | POA: Diagnosis not present

## 2021-10-16 ENCOUNTER — Encounter: Payer: Medicare Other | Admitting: Nurse Practitioner

## 2021-11-11 NOTE — Progress Notes (Signed)
MEDICARE ANNUAL WELLNESS VISIT AND 3 MONTH  Assessment:   Encounter for Medicare annual wellness exam  Essential hypertension - continue medications, DASH diet, exercise and monitor at home. Call if greater than 130/80.   History of ductal carcinoma in situ (DCIS) of breast, left, s/p resection UTD on mammograms Continue to monitor  Hyperlipidemia -continue medications, check lipids, decrease fatty foods, increase activity.   Prediabetes Discussed general issues about diabetes pathophysiology and management., Educational material distributed., Suggested low cholesterol diet., Encouraged aerobic exercise., Discussed foot care., Reminded to get yearly retinal exam. Check A1C annually; monitor weight and serum glucose trends;  CMP/GFR  Vitamin D deficiency Continue supplement  Medication management CBC, CMP/GFR, magnesium   Obesity Long discussion about weight loss, diet, and exercise Discussed final goal weight and current weight loss goal (<165lb) Patient will work on high protein snacks Patient on phentermine with benefit and no SE, taking drug breaks; continue close follow up. Return in     Over 40 minutes of exam, counseling, chart review and critical decision making was performed Future Appointments  Date Time Provider [place]  12/23/2021 10:00 AM [facility] 2 [facility]  02/17/2022  4:00 PM Unk Pinto, MD [facility] None  08/24/2022  4:00 PM Magda Bernheim, NP [facility] None     Plan:   During the course of the visit the patient was educated and counseled about appropriate screening and preventive services including:   Pneumococcal vaccine  Prevnar 13 Influenza vaccine Td vaccine Screening electrocardiogram Bone densitometry screening Colorectal cancer screening Diabetes screening Glaucoma screening Nutrition counseling  Advanced directives: requested   Subjective:  Shawna [name] is a 70 y.o. female who presents for Medicare  Annual Wellness Visit and OV.     Patient is s/p lumpectomy of L breast DCIS in 2006. Continues with annual mammograms via breast center.   BMI is Body mass index is 33.8 kg/m., she is working on diet and exercise. She has lost 7 pounds in the past 3 months- currently on Phentermine.  She had some mild headaches with Topamax so had not been taking They deny palpitations, anxiety, trouble sleeping, elevated BP.  Wt Readings from Last 3 Encounters:  11/13/21 187 lb 12.8 oz (85.2 kg)  09/04/21 189 lb 3.2 oz (85.8 kg)  08/12/21 194 lb 6.4 oz (88.2 kg)    Typical breakfast/lunch: whole grain bread sandwich with turkey/lettuce/tomato and mustard, or an egg or cereal, fruit Typical snack: crackers Typical dinner: veggies with protein (baked or grilled)  Exercise: active around house and yard, but no intentional exercise  Water intake: unsweet tea, water   Her blood pressure has been controlled at home, today their BP is BP: 132/76  She does not workout but has part time job and takes care of her husband. She denies chest pain, shortness of breath, dizziness.   She is on cholesterol medication, Crestor 20 mg QD and denies myalgias. Her cholesterol is at goal. The cholesterol last visit was:   Lab Results  Component Value Date   CHOL 186 08/12/2021   HDL 49 (L) 08/12/2021   LDLCALC 115 (H) 08/12/2021   TRIG 114 08/12/2021   CHOLHDL 3.8 08/12/2021    She has been working on diet and exercise for prediabetes, and denies paresthesia of the feet, polydipsia, polyuria and visual disturbances. Last A1C in the office was:  Lab Results  Component Value Date   HGBA1C 6.0 (H) 08/12/2021   Last GFR: Lab Results  Component Value Date  GFRNONAA 74 05/06/2021   Patient is on Vitamin D supplement.   Lab Results  Component Value Date   VD25OH 71 08/12/2021       Medication Review: Current Outpatient Medications on File Prior to Visit  Medication Sig Dispense Refill  . aspirin 81 MG tablet  Take 81 mg by mouth daily.    . bisoprolol-hydrochlorothiazide (ZIAC) 5-6.25 MG tablet Take  1 tablet  every Morning  for BP 90 tablet 3  . Cholecalciferol (VITAMIN D PO) Take 2,000 Units by mouth. Takes 10000 iu daily    . Magnesium 250 MG TABS Take by mouth daily.    . Multiple Vitamin (MULTIVITAMIN) tablet Take 1 tablet by mouth daily.    . phentermine (ADIPEX-P) 37.5 MG tablet Take 1/2 to 1 tablet every Morning for Dieting & Weight Loss 90 tablet 1  . rosuvastatin (CRESTOR) 20 MG tablet Take 1 tablet Daily for Cholesterol 90 tablet 3  . levofloxacin (LEVAQUIN) 500 MG tablet Take  1 tablet Daily  with Food  for Infection (Patient not taking: Reported on 11/13/2021) 10 tablet 0  . topiramate (TOPAMAX) 50 MG tablet Take 1/2 to 1 tablet 2 x /day at Suppertime & Bedtime for Dieting & Weight Loss (Patient not taking: Reported on 11/13/2021) 180 tablet 1   No current facility-administered medications on file prior to visit.    Allergies  Allergen Reactions  . Nitrofurantoin Monohyd Macro Nausea Only    Current Problems (verified) Patient Active Problem List   Diagnosis Date Noted  . Medication management 09/05/2014  . Obesity (BMI 30.0-34.9) 09/05/2014  . Essential hypertension 11/24/2013  . Hyperlipidemia 11/24/2013  . Abnormal glucose (prediabetes) 11/24/2013  . Vitamin D deficiency 11/24/2013  . DCIS (ductal carcinoma in situ) of breast, Left lumpectomy. 09/23/2005.     Screening Tests Immunization History  Administered Date(s) Administered  . DTaP 06/26/1999  . Influenza Split 09/05/2014, 10/04/2015  . Influenza Whole 09/01/2013  . Influenza, High Dose Seasonal PF 08/12/2017, 09/01/2018, 09/11/2019, 09/12/2020, 09/22/2021  . Influenza-Unspecified 09/09/2016  . PFIZER Comirnaty(Gray Top)Covid-19 Tri-Sucrose Vaccine 11/05/2020  . PFIZER(Purple Top)SARS-COV-2 Vaccination 03/04/2020, 03/26/2020  . PPD Test 03/05/2014, 03/11/2015, 03/30/2016  . Pneumococcal Conjugate-13 04/22/2017   . Pneumococcal Polysaccharide-23 02/04/2006, 09/01/2018  . Tdap 09/13/2009  . Zoster, Live 10/04/2015   Preventative care: Last colonoscopy: 06/17/2018, q 5 years, dad with colon cancer, Dr. Oletta Lamas due 2024 Last mammogram: 12/18/20 negative repeat 1 year Last pap smear/pelvic exam: 2019, due 12/2018 follows Dr. Phineas Real DEXA: 04/01/21, osteopenia Xray Cervical spine 2014  Prior vaccinations: TD or Tdap: 2010, declines, will get if needed Influenza: 09/22/21  Pneumococcal: 2019 Prevnar13: 2018 Shingles/Zostavax: 2016  Names of Other Physician/Practitioners you currently use: 1. [facility] here for primary care 2. Dr. Delman Cheadle, eye doctor, last visit 2022 3. Dr. Felipa Eth, dentist, last visit 2022, q 6 months   Patient Care Team: Unk Pinto, MD as PCP - General (Internal Medicine)  SURGICAL HISTORY She  has a past surgical history that includes Salpingoophorectomy (1978); Breast lumpectomy (Left, 2006); and Colonoscopy (N/A, 05/24/2013). FAMILY HISTORY Her family history includes Breast cancer in her paternal grandmother; Cancer in her sister; Colon cancer in her father; Diabetes in her sister; Heart disease in her sister; Hypertension in her mother. SOCIAL HISTORY She  reports that she has never smoked. She has never used smokeless tobacco. She reports that she does not drink alcohol and does not use drugs.   MEDICARE WELLNESS OBJECTIVES: Physical activity: Current Exercise Habits:  Home exercise routine, Type of exercise: Other - see comments (yard work), Time (Minutes): 30, Frequency (Times/Week): 3, Weekly Exercise (Minutes/Week): 90, Intensity: Mild, Exercise limited by: None identified Cardiac risk factors: Cardiac Risk Factors include: advanced age (>14men, >78 women);dyslipidemia;hypertension;sedentary lifestyle;obesity (BMI >30kg/m2) Depression/mood screen:   Depression screen [facility] 2/9 11/13/2021  Decreased Interest 0  Down,  Depressed, Hopeless 0  PHQ - 2 Score 0    ADLs:  In your present state of health, do you have any difficulty performing the following activities: 11/13/2021  Hearing? N  Vision? N  Difficulty concentrating or making decisions? N  Walking or climbing stairs? N  Dressing or bathing? N  Doing errands, shopping? N  Some recent data might be hidden      Cognitive Testing  Alert? Yes  Normal Appearance?Yes  Oriented to person? Yes  Place? Yes   Time? Yes  Recall of three objects?  Yes  Can perform simple calculations? Yes  Displays appropriate judgment?Yes  Can read the correct time from a watch face?Yes  EOL planning: Does Patient Have a Medical Advance Directive?: No Would patient like information on creating a medical advance directive?: No - Patient declined  Review of Systems  Constitutional: Negative.  Negative for malaise/fatigue and weight loss.  HENT: Negative.  Negative for hearing loss and tinnitus.   Eyes: Negative.  Negative for blurred vision and double vision.  Respiratory: Negative.  Negative for cough, sputum production, shortness of breath and wheezing.   Cardiovascular: Negative.  Negative for chest pain, palpitations, orthopnea, claudication, leg swelling and PND.  Gastrointestinal: Negative.  Negative for abdominal pain, blood in stool, constipation, diarrhea, heartburn, melena, nausea and vomiting.  Genitourinary: Negative.   Musculoskeletal: Negative.  Negative for falls, joint pain and myalgias.  Skin: Negative.  Negative for rash.  Neurological: Negative.  Negative for dizziness, tingling, sensory change, weakness and headaches.  Endo/Heme/Allergies: Negative.  Negative for polydipsia.  Psychiatric/Behavioral: Negative.  Negative for depression, memory loss, substance abuse and suicidal ideas. The patient is not nervous/anxious and does not have insomnia.   All other systems reviewed and are negative.   Objective:     Today's Vitals   11/13/21 1435   BP: 132/76  Pulse: 77  Temp: (!) 97.5 F (36.4 C)  SpO2: 95%  Weight: 187 lb 12.8 oz (85.2 kg)   Body mass index is 33.8 kg/m.  General appearance: alert, no distress, WD/WN, female HEENT: normocephalic, sclerae anicteric, TMs pearly, nares patent, no discharge or erythema, pharynx normal Oral cavity: MMM, no lesions Neck: supple, no lymphadenopathy, no thyromegaly, no masses Heart: RRR, normal S1, S2, no murmurs Lungs: CTA bilaterally, no wheezes, rhonchi, or rales Abdomen: +bs, soft, non tender, non distended, no masses, no hepatomegaly, no splenomegaly Musculoskeletal: nontender, no swelling, no obvious deformity Extremities:  no cyanosis, no clubbing, nonpitting edema bil of ankles/feet Pulses: 2+ symmetric, upper and lower extremities, normal cap refill Neurological: alert, oriented x 3, CN2-12 intact, strength normal upper extremities and lower extremities, sensation normal throughout, DTRs 2+ throughout, no cerebellar signs, gait normal Psychiatric: normal affect, behavior normal, pleasant   Medicare Attestation I have personally reviewed: The patient's medical and social history Their use of alcohol, tobacco or illicit drugs Their current medications and supplements The patient's functional ability including ADLs,fall risks, home safety risks, cognitive, and hearing and visual impairment Diet and physical activities Evidence for depression or mood disorders  The patient's weight, height, BMI, and visual acuity have been recorded in the chart.  I  have made referrals, counseling, and provided education to the patient based on review of the above and I have provided the patient with a written personalized care plan for preventive services.     Magda Bernheim, NP   11/13/2021

## 2021-11-13 ENCOUNTER — Other Ambulatory Visit: Payer: Self-pay

## 2021-11-13 ENCOUNTER — Encounter: Payer: Self-pay | Admitting: Nurse Practitioner

## 2021-11-13 ENCOUNTER — Ambulatory Visit (INDEPENDENT_AMBULATORY_CARE_PROVIDER_SITE_OTHER): Payer: Medicare Other | Admitting: Nurse Practitioner

## 2021-11-13 VITALS — BP 132/76 | HR 77 | Temp 97.5°F | Wt 187.8 lb

## 2021-11-13 DIAGNOSIS — R7309 Other abnormal glucose: Secondary | ICD-10-CM

## 2021-11-13 DIAGNOSIS — Z0001 Encounter for general adult medical examination with abnormal findings: Secondary | ICD-10-CM | POA: Diagnosis not present

## 2021-11-13 DIAGNOSIS — Z79899 Other long term (current) drug therapy: Secondary | ICD-10-CM

## 2021-11-13 DIAGNOSIS — E559 Vitamin D deficiency, unspecified: Secondary | ICD-10-CM

## 2021-11-13 DIAGNOSIS — R6889 Other general symptoms and signs: Secondary | ICD-10-CM | POA: Diagnosis not present

## 2021-11-13 DIAGNOSIS — E782 Mixed hyperlipidemia: Secondary | ICD-10-CM | POA: Diagnosis not present

## 2021-11-13 DIAGNOSIS — I10 Essential (primary) hypertension: Secondary | ICD-10-CM

## 2021-11-13 DIAGNOSIS — Z86000 Personal history of in-situ neoplasm of breast: Secondary | ICD-10-CM

## 2021-11-13 DIAGNOSIS — Z0000 Encounter for general adult medical examination without abnormal findings: Secondary | ICD-10-CM

## 2021-11-13 NOTE — Patient Instructions (Signed)

## 2021-11-14 LAB — CBC WITH DIFFERENTIAL/PLATELET
Absolute Monocytes: 523 cells/uL (ref 200–950)
Basophils Absolute: 39 cells/uL (ref 0–200)
Basophils Relative: 0.5 %
Eosinophils Absolute: 101 cells/uL (ref 15–500)
Eosinophils Relative: 1.3 %
HCT: 42 % (ref 35.0–45.0)
Hemoglobin: 14 g/dL (ref 11.7–15.5)
Lymphs Abs: 2792 cells/uL (ref 850–3900)
MCH: 30.7 pg (ref 27.0–33.0)
MCHC: 33.3 g/dL (ref 32.0–36.0)
MCV: 92.1 fL (ref 80.0–100.0)
MPV: 10.5 fL (ref 7.5–12.5)
Monocytes Relative: 6.7 %
Neutro Abs: 4345 cells/uL (ref 1500–7800)
Neutrophils Relative %: 55.7 %
Platelets: 201 10*3/uL (ref 140–400)
RBC: 4.56 10*6/uL (ref 3.80–5.10)
RDW: 13 % (ref 11.0–15.0)
Total Lymphocyte: 35.8 %
WBC: 7.8 10*3/uL (ref 3.8–10.8)

## 2021-11-14 LAB — COMPLETE METABOLIC PANEL WITH GFR
AG Ratio: 2 (calc) (ref 1.0–2.5)
ALT: 15 U/L (ref 6–29)
AST: 20 U/L (ref 10–35)
Albumin: 4.3 g/dL (ref 3.6–5.1)
Alkaline phosphatase (APISO): 76 U/L (ref 37–153)
BUN: 20 mg/dL (ref 7–25)
CO2: 29 mmol/L (ref 20–32)
Calcium: 9.9 mg/dL (ref 8.6–10.4)
Chloride: 103 mmol/L (ref 98–110)
Creat: 1 mg/dL (ref 0.60–1.00)
Globulin: 2.1 g/dL (calc) (ref 1.9–3.7)
Glucose, Bld: 88 mg/dL (ref 65–99)
Potassium: 4.7 mmol/L (ref 3.5–5.3)
Sodium: 143 mmol/L (ref 135–146)
Total Bilirubin: 0.4 mg/dL (ref 0.2–1.2)
Total Protein: 6.4 g/dL (ref 6.1–8.1)
eGFR: 61 mL/min/{1.73_m2} (ref >=60)

## 2021-11-14 LAB — LIPID PANEL
Cholesterol: 147 mg/dL (ref <200)
HDL: 57 mg/dL (ref >=50)
LDL Cholesterol (Calc): 68 mg/dL (calc)
Non-HDL Cholesterol (Calc): 90 mg/dL (calc) (ref <130)
Total CHOL/HDL Ratio: 2.6 (calc) (ref <5.0)
Triglycerides: 141 mg/dL (ref <150)

## 2021-11-14 LAB — HEMOGLOBIN A1C
Hgb A1c MFr Bld: 6.1 % of total Hgb — ABNORMAL HIGH (ref <5.7)
Mean Plasma Glucose: 128 mg/dL
eAG (mmol/L): 7.1 mmol/L

## 2021-12-04 ENCOUNTER — Other Ambulatory Visit: Payer: Self-pay

## 2021-12-04 ENCOUNTER — Ambulatory Visit: Payer: Medicare Other

## 2021-12-04 VITALS — BP 128/72 | HR 85 | Temp 97.9°F | Resp 16 | Ht 62.5 in | Wt 188.6 lb

## 2021-12-04 DIAGNOSIS — R300 Dysuria: Secondary | ICD-10-CM | POA: Diagnosis not present

## 2021-12-04 NOTE — Progress Notes (Signed)
The patient reports urinary urgency, cloudy urine, burning and abdominal pressure. The patient reports that she had onset of these symptoms the weekend of Christmas. The patient gave a urine specimen today.

## 2021-12-05 ENCOUNTER — Other Ambulatory Visit: Payer: Self-pay | Admitting: Internal Medicine

## 2021-12-05 DIAGNOSIS — N3000 Acute cystitis without hematuria: Secondary | ICD-10-CM

## 2021-12-05 MED ORDER — CIPROFLOXACIN HCL 250 MG PO TABS: 250.0000 mg | ORAL_TABLET | Freq: Two times a day (BID) | ORAL | 0 refills | Status: AC

## 2021-12-06 ENCOUNTER — Other Ambulatory Visit: Payer: Self-pay | Admitting: Internal Medicine

## 2021-12-06 DIAGNOSIS — N3000 Acute cystitis without hematuria: Secondary | ICD-10-CM

## 2021-12-06 LAB — URINE CULTURE
MICRO NUMBER:: 12809581
SPECIMEN QUALITY:: ADEQUATE

## 2021-12-06 LAB — URINALYSIS, ROUTINE W REFLEX MICROSCOPIC
Bilirubin Urine: NEGATIVE
Glucose, UA: NEGATIVE
Hyaline Cast: NONE SEEN /LPF
Ketones, ur: NEGATIVE
Nitrite: POSITIVE — AB
Specific Gravity, Urine: 1.019 (ref 1.001–1.035)
Yeast: NONE SEEN /HPF
pH: 5.5 (ref 5.0–8.0)

## 2021-12-06 LAB — MICROSCOPIC MESSAGE

## 2021-12-06 NOTE — Progress Notes (Signed)
============================================================ °============================================================ ° °-    Final Sensitivities of the E.Coli UTI are . . . . .   - very sensitive to the Cipro that was sent in, So . . . Marland Kitchen  - Please finish & schedule a Nurse visit 2-3 weeks after finish the Cipro   To assure the infection is resolved

## 2021-12-23 ENCOUNTER — Ambulatory Visit
Admission: RE | Admit: 2021-12-23 | Discharge: 2021-12-23 | Disposition: A | Discharge status: Home / Self Care | Payer: Medicare Other | Source: Ambulatory Visit | Attending: Internal Medicine | Admitting: Internal Medicine

## 2021-12-23 ENCOUNTER — Other Ambulatory Visit: Payer: Self-pay

## 2021-12-23 DIAGNOSIS — Z1231 Encounter for screening mammogram for malignant neoplasm of breast: Secondary | ICD-10-CM | POA: Diagnosis not present

## 2022-02-17 ENCOUNTER — Encounter: Payer: Medicare Other | Admitting: Internal Medicine

## 2022-03-05 ENCOUNTER — Encounter: Payer: Self-pay | Admitting: Internal Medicine

## 2022-03-05 NOTE — Progress Notes (Signed)
? ?Annual Screening/Preventative Visit ?& Comprehensive Evaluation &  Examination ? ?Future Appointments  ?Date Time Provider Department  ?03/06/2022 10:00 AM Unk Pinto, MD [facility]  ?03/18/2022  1:30 PM Tamela Gammon, NP [facility]  ?08/24/2022           Wellness  4:00 PM Magda Bernheim, NP [facility]  ?03/15/2023 10:00 AM Unk Pinto, MD [facility]  ? ? ?    This very nice 71 y.o.  [place]  (widowed 07/22/2020) presents for a Screening /Preventative Visit & comprehensive evaluation and management of multiple medical co-morbidities.  Patient has been followed for HTN, HLD, Prediabetes  and Vitamin D Deficiency.  Patient has hx/o Lt Breast Lumpectomy in 2006.  ? ? ?     HTN predates since 2010. Patient's BP has been controlled at home and patient denies any cardiac symptoms as chest pain, palpitations, shortness of breath, dizziness or ankle swelling. Today's BP is at goal -  128/82 . ? ? ?    Patient's hyperlipidemia is controlled with diet and Rosuvastatin. Patient denies myalgias or other medication SE's. Last lipids were at goal : ? ?Lab Results  ?Component Value Date  ? CHOL 147 11/13/2021  ? HDL 57 11/13/2021  ? Miller 68 11/13/2021  ? TRIG 141 11/13/2021  ? CHOLHDL 2.6 11/13/2021  ? ? ? ?    Patient has hx/o Morbid Obesity  (BMI 33+) & history of PreDiabetes (A1c 5.8% /2010 and 6.1% /2011) and patient denies reactive hypoglycemic symptoms, visual blurring, diabetic polys or paresthesias. Last A1c was not at goal : ? ?Lab Results  ?Component Value Date  ? HGBA1C 6.1 (H) 11/13/2021  ? ? ? ?    Finally, patient has history of Vitamin D Deficiency ("36" /2008) and last Vitamin D was at goal : ? ?Lab Results  ?Component Value Date  ? VD25OH 71 08/12/2021  ? ? ? ?Current Outpatient Medications on File Prior to Visit  ?Medication Sig  ? aspirin 81 MG tablet Take daily.  ? bisoprolol-hctz 5-6.25 MG tablet Take  1 tablet  every Morning   ? VITAMIN D  Takes 10,000 u daily  ? Magnesium 250 MG TABS Take daily.  ?  Multiple Vitamin  Take 1 tablet daily.  ? phentermine 37.5 MG tablet Take 1/2 to 1 tablet every Morning   ? rosuvastatin  20 MG tablet Take 1 tablet Daily f  ? ? ? ? ?Allergies  ?Allergen Reactions  ? Nitrofurantoin Monohyd Macro Nausea Only  ? ? ? ?Past Medical History:  ?Diagnosis Date  ? Cancer [facility])   ? Breast  ? DCIS (ductal carcinoma in situ) of breast   ? left  ? Hypercholesteremia   ? Osteopenia 04/2018  ? T score -1.7.  Overall stable from prior DEXA  ? Serous cyst of ovary   ? left  ? ? ? ?Health Maintenance  ?Topic Date Due  ? Zoster Vaccines- Shingrix (1 of 2) Never done  ? COVID-19 Vaccine (4 - Booster for [place] series) 12/31/2020  ? TETANUS/TDAP  11/13/2022 (Originally 09/14/2019)  ? MAMMOGRAM  12/24/2023  ? Pneumonia Vaccine 7+ Years old  Completed  ? INFLUENZA VACCINE  Completed  ? DEXA SCAN  Completed  ? Hepatitis C Screening  Completed  ? HPV VACCINES  Aged Out  ? PAP SMEAR-Modifier  Discontinued  ? ? ? ?Immunization History  ?Administered Date(s) Administered  ? DTaP 06/26/1999  ? Influenza Split 09/05/2014, 10/04/2015  ? Influenza Whole 09/01/2013  ? Influenza, High Dose  09/11/2019, 09/12/2020, 09/22/2021  ? Influenza 09/09/2016  ? PFIZER Cov-19 Tri-Sucrose Vacc 11/05/2020  ? PFIZERSARS-COV-2 Vacc 03/04/2020, 03/26/2020  ? PPD Test 03/05/2014, 03/11/2015, 03/30/2016  ? Pneumococcal - 13 04/22/2017  ? Pneumococcal - 23 02/04/2006, 09/01/2018  ? Tdap 09/13/2009  ? Zoster, Live 10/04/2015  ? ? ? ?Last Colon - 06/17/2018 - Dr Arty Baumgartner -  Recc 5 year f/u due July 2024 ? ? ?Last MGM - 12/23/2021  ? ? ?Past Surgical History:  ?Procedure Laterality Date  ? BREAST LUMPECTOMY Left 2006  ? COLONOSCOPY N/A 05/24/2013  ? Dr Daine Gravel  ? SALPINGOOPHORECTOMY  1978  ? left  ? ? ? ?Family History  ?Problem Relation Age of Onset  ? Hypertension Mother   ? Colon cancer Father   ? Diabetes Sister   ? Heart disease Sister   ? Cancer Sister   ?     ? Type  ? Breast cancer Paternal Grandmother   ?     Age 8's   ? ? ? ?Social History  ? ?Tobacco Use  ? Smoking status: Never  ? Smokeless tobacco: Never  ?Vaping Use  ? Vaping Use: Never used  ?Substance Use Topics  ? Alcohol use: No  ?  Alcohol/week: 0.0 standard drinks  ? Drug use: No  ? ? ? ? ROS ?Constitutional: Denies fever, chills, weight loss/gain, headaches, insomnia,  night sweats, and change in appetite. Does c/o fatigue. ?Eyes: Denies redness, blurred vision, diplopia, discharge, itchy, watery eyes.  ?ENT: Denies discharge, congestion, post nasal drip, epistaxis, sore throat, earache, hearing loss, dental pain, Tinnitus, Vertigo, Sinus pain, snoring.  ?Cardio: Denies chest pain, palpitations, irregular heartbeat, syncope, dyspnea, diaphoresis, orthopnea, PND, claudication, edema ?Respiratory: denies cough, dyspnea, DOE, pleurisy, hoarseness, laryngitis, wheezing.  ?Gastrointestinal: Denies dysphagia, heartburn, reflux, water brash, pain, cramps, nausea, vomiting, bloating, diarrhea, constipation, hematemesis, melena, hematochezia, jaundice, hemorrhoids ?Genitourinary: Denies dysuria, frequency, urgency, nocturia, hesitancy, discharge, hematuria, flank pain ?Breast: Breast lumps, nipple discharge, bleeding.  ?Musculoskeletal: Denies arthralgia, myalgia, stiffness, Jt. Swelling, pain, limp, and strain/sprain. Denies falls. ?Skin: Denies puritis, rash, hives, warts, acne, eczema, changing in skin lesion ?Neuro: No weakness, tremor, incoordination, spasms, paresthesia, pain ?Psychiatric: Denies confusion, memory loss, sensory loss. Denies Depression. ?Endocrine: Denies change in weight, skin, hair change, nocturia, and paresthesia, diabetic polys, visual blurring, hyper / hypo glycemic episodes.  ?Heme/Lymph: No excessive bleeding, bruising, enlarged lymph nodes. ? ?Physical Exam ? ?BP 128/82   Pulse 77   Temp 97.9 ?F (36.6 ?C)   Resp 16   Ht 5' 2.5" (1.588 m)   Wt 188 lb (85.3 kg)   SpO2 98%   BMI 33.84 kg/m?  ? ?General Appearance: Over nourished and in no  apparent distress. ? ?Eyes: PERRLA, EOMs, conjunctiva no swelling or erythema, normal fundi and vessels. ?Sinuses: No frontal/maxillary tenderness ?ENT/Mouth: EACs patent / TMs  nl. Nares clear without erythema, swelling, mucoid exudates. Oral hygiene is good. No erythema, swelling, or exudate. Tongue normal, non-obstructing. Tonsils not swollen or erythematous. Hearing normal.  ?Neck: Supple, thyroid not palpable. No bruits, nodes or JVD. ?Respiratory: Respiratory effort normal.  BS equal and clear bilateral without rales, rhonci, wheezing or stridor. ?Cardio: Heart sounds are normal with regular rate and rhythm and no murmurs, rubs or gallops. Peripheral pulses are normal and equal bilaterally without edema. No aortic or femoral bruits. ?Chest: symmetric with normal excursions and percussion. ?Breasts: Symmetric, without lumps, nipple discharge, retractions, or fibrocystic changes.  ?Abdomen: Flat, soft with bowel sounds active.  Nontender, no guarding, rebound, hernias, masses, or organomegaly.  ?Lymphatics: Non tender without lymphadenopathy.  ?Genitourinary:  ?Musculoskeletal: Full ROM all peripheral extremities, joint stability, 5/5 strength, and normal gait. ?Skin: Warm and dry without rashes, lesions, cyanosis, clubbing or  ecchymosis.  ?Neuro: Cranial nerves intact, reflexes equal bilaterally. Normal muscle tone, no cerebellar symptoms. Sensation intact.  ?Pysch: Alert and oriented X 3, normal affect, Insight and Judgment appropriate.  ? ? ?Assessment and Plan ? ?1. Essential hypertension ? ?- EKG 12-Lead ?- Urinalysis, Routine w reflex microscopic ?- Microalbumin / creatinine urine ratio ?- CBC with Differential/Platelet ? ?2. Hyperlipidemia, mixed ? ?- EKG 12-Lead ?- Lipid panel ?- TSH ? ?3. Abnormal glucose ? ?- EKG 12-Lead ?- Hemoglobin A1c ?- Insulin, random ? ?4. Vitamin D deficiency ? ?- VITAMIN D 25 Hydroxy  ? ?5. Class 2 severe obesity with body mass index  ?(BMI 33.84)  with serious comorbidity  ([place]) ? ?- Hemoglobin A1c ? ?6. Screening for heart disease ? ?- EKG 12-Lead ? ?7. FHx: heart disease ? ?- EKG 12-Lead ? ?8. Screening for colorectal cancer ? ?- POC Hemoccult Bld/Stl  ?- CBC with Differential/Platel

## 2022-03-05 NOTE — Patient Instructions (Signed)

## 2022-03-06 ENCOUNTER — Encounter: Payer: Self-pay | Admitting: Internal Medicine

## 2022-03-06 ENCOUNTER — Ambulatory Visit (INDEPENDENT_AMBULATORY_CARE_PROVIDER_SITE_OTHER): Payer: Medicare Other | Admitting: Internal Medicine

## 2022-03-06 VITALS — BP 128/82 | HR 77 | Temp 97.9°F | Resp 16 | Ht 62.5 in | Wt 188.0 lb

## 2022-03-06 DIAGNOSIS — R7309 Other abnormal glucose: Secondary | ICD-10-CM

## 2022-03-06 DIAGNOSIS — E559 Vitamin D deficiency, unspecified: Secondary | ICD-10-CM | POA: Diagnosis not present

## 2022-03-06 DIAGNOSIS — Z79899 Other long term (current) drug therapy: Secondary | ICD-10-CM | POA: Diagnosis not present

## 2022-03-06 DIAGNOSIS — Z136 Encounter for screening for cardiovascular disorders: Secondary | ICD-10-CM

## 2022-03-06 DIAGNOSIS — Z8249 Family history of ischemic heart disease and other diseases of the circulatory system: Secondary | ICD-10-CM | POA: Diagnosis not present

## 2022-03-06 DIAGNOSIS — I10 Essential (primary) hypertension: Secondary | ICD-10-CM | POA: Diagnosis not present

## 2022-03-06 DIAGNOSIS — Z1211 Encounter for screening for malignant neoplasm of colon: Secondary | ICD-10-CM | POA: Diagnosis not present

## 2022-03-06 DIAGNOSIS — Z1212 Encounter for screening for malignant neoplasm of rectum: Secondary | ICD-10-CM | POA: Diagnosis not present

## 2022-03-06 DIAGNOSIS — E782 Mixed hyperlipidemia: Secondary | ICD-10-CM

## 2022-03-07 NOTE — Progress Notes (Signed)
<><><><><><><><><><><><><><><><><><><><><><><><><><><><><><><><><> ?<><><><><><><><><><><><><><><><><><><><><><><><><><><><><><><><><> ?-   Test results slightly outside the reference range are not unusual. ?If there is anything important, I will review this with you,  ?otherwise it is considered normal test values.  ?If you have further questions,  ?please do not hesitate to contact me at the office or via My Chart.  ?<><><><><><><><><><><><><><><><><><><><><><><><><><><><><><><><><> ?<><><><><><><><><><><><><><><><><><><><><><><><><><><><><><><><><> ? ?-  A1c = 6.1% - Blood sugar and A1c are  STILL elevated in the borderline and  ?early or pre-diabetes range which has the same  ? ?300% increased risk for heart attack, stroke, cancer and  ? ?alzheimer- type vascular dementia as full blown diabetes.  ? ?But the good news is that diet, exercise with  ?weight loss can cure the early diabetes at this point. ? ?Your blood sugar and A1c are elevated.   ? ?Being diabetic has a  300% increased risk for heart attack,  ?stroke, cancer, and alzheimer- type vascular dementia.  ? ?It is very important that you work harder with diet by  ?avoiding all foods that are white except chicken,   ?fish & calliflower. ? ?- Avoid white rice  ?(brown & wild rice is OK),  ? ?- Avoid white potatoes  ?(sweet potatoes in moderation is OK),  ? ?White bread or wheat bread or anything made out of  ? ?white flour like bagels, donuts, rolls, buns, biscuits, cakes, ? ?- pastries, cookies, pizza crust, and pasta (made from  ?white flour & egg whites)  ? ?- vegetarian pasta or spinach or wheat pasta is OK. ? ?- Multigrain breads like Arnold's, Pepperidge Farm or  ? ?multigrain sandwich thins or high fiber breads like  ? ?[place] bread or "Dave's Killer" breads that are  ?4 to 5 grams fiber per slice !  are best.   ? ?Diet, exercise and weight loss can reverse and cure  ?diabetes in the early stages.   ? ?- Diet, exercise and weight loss is very important  in the  ? ?control and prevention of complications of diabetes which ? affects every system in your body, ie.  ? ?-Brain - dementia/stroke,  ?- eyes - glaucoma/blindness,  ?- heart - heart attack/heart failure,  ?- kidneys - dialysis,  ?- stomach - gastric paralysis,  ?- intestines - malabsorption,  ?- nerves - severe painful neuritis,  ?- circulation - gangrene & loss of a leg(s)  ?- and finally  . . . . . . . . . . . . . . . . . .   ? ?- cancer and Alzheimers. ?<><><><><><><><><><><><><><><><><><><><><><><><><><><><><><><><><> ?<><><><><><><><><><><><><><><><><><><><><><><><><><><><><><><><><> ? ?-  U/A is Normal Now  - No sign of Infection ?<><><><><><><><><><><><><><><><><><><><><><><><><><><><><><><><><> ?<><><><><><><><><><><><><><><><><><><><><><><><><><><><><><><><><> ? ?-  Total Chol = 144     &    LDL Chol = 73    -  Excellent  ? ?- Very low risk for Heart Attack  / Stroke ?<><><><><><><><><><><><><><><><><><><><><><><><><><><><><><><><><> ?<><><><><><><><><><><><><><><><><><><><><><><><><><><><><><><><><> ? ?-  Vitamin D = 97   - Excellent     !     -   Please keep dose same  ! ?<><><><><><><><><><><><><><><><><><><><><><><><><><><><><><><><><> ?<><><><><><><><><><><><><><><><><><><><><><><><><><><><><><><><><> ? ?-  All Else - CBC - Kidneys - Electrolytes - Liver - Magnesium & Thyroid   ? ?- all  Normal / OK ?<><><><><><><><><><><><><><><><><><><><><><><><><><><><><><><><><> ?<><><><><><><><><><><><><><><><><><><><><><><><><><><><><><><><><> ? ?-  Keep up the [place] work  !   ? ?<><><><><><><><><><><><><><><><><><><><><><><><><><><><><><><><><> ?<><><><><><><><><><><><><><><><><><><><><><><><><><><><><><><><><> ? ? ? ? ? ? ? ? ? ? ? ? ? ? ? ? ? ? ? ? ? ? ? ? ? ? ? ? ? ? ? ? ? ? ?

## 2022-03-09 LAB — URINALYSIS, ROUTINE W REFLEX MICROSCOPIC
Bilirubin Urine: NEGATIVE
Glucose, UA: NEGATIVE
Hgb urine dipstick: NEGATIVE
Ketones, ur: NEGATIVE
Leukocytes,Ua: NEGATIVE
Nitrite: NEGATIVE
Protein, ur: NEGATIVE
Specific Gravity, Urine: 1.017 (ref 1.001–1.035)
pH: 6.5 (ref 5.0–8.0)

## 2022-03-09 LAB — COMPLETE METABOLIC PANEL WITH GFR
AG Ratio: 2 (calc) (ref 1.0–2.5)
ALT: 14 U/L (ref 6–29)
AST: 18 U/L (ref 10–35)
Albumin: 4.1 g/dL (ref 3.6–5.1)
Alkaline phosphatase (APISO): 75 U/L (ref 37–153)
BUN: 18 mg/dL (ref 7–25)
CO2: 26 mmol/L (ref 20–32)
Calcium: 9.7 mg/dL (ref 8.6–10.4)
Chloride: 106 mmol/L (ref 98–110)
Creat: 0.78 mg/dL (ref 0.60–1.00)
Globulin: 2.1 g/dL (calc) (ref 1.9–3.7)
Glucose, Bld: 101 mg/dL — ABNORMAL HIGH (ref 65–99)
Potassium: 4.4 mmol/L (ref 3.5–5.3)
Sodium: 141 mmol/L (ref 135–146)
Total Bilirubin: 0.4 mg/dL (ref 0.2–1.2)
Total Protein: 6.2 g/dL (ref 6.1–8.1)
eGFR: 82 mL/min/{1.73_m2} (ref >=60)

## 2022-03-09 LAB — CBC WITH DIFFERENTIAL/PLATELET
Absolute Monocytes: 760 cells/uL (ref 200–950)
Basophils Absolute: 38 cells/uL (ref 0–200)
Basophils Relative: 0.4 %
Eosinophils Absolute: 133 cells/uL (ref 15–500)
Eosinophils Relative: 1.4 %
HCT: 39.1 % (ref 35.0–45.0)
Hemoglobin: 13.3 g/dL (ref 11.7–15.5)
Lymphs Abs: 1891 cells/uL (ref 850–3900)
MCH: 31.2 pg (ref 27.0–33.0)
MCHC: 34 g/dL (ref 32.0–36.0)
MCV: 91.8 fL (ref 80.0–100.0)
MPV: 10.5 fL (ref 7.5–12.5)
Monocytes Relative: 8 %
Neutro Abs: 6679 cells/uL (ref 1500–7800)
Neutrophils Relative %: 70.3 %
Platelets: 208 10*3/uL (ref 140–400)
RBC: 4.26 10*6/uL (ref 3.80–5.10)
RDW: 12.9 % (ref 11.0–15.0)
Total Lymphocyte: 19.9 %
WBC: 9.5 10*3/uL (ref 3.8–10.8)

## 2022-03-09 LAB — LIPID PANEL
Cholesterol: 144 mg/dL (ref <200)
HDL: 55 mg/dL (ref >=50)
LDL Cholesterol (Calc): 73 mg/dL (calc)
Non-HDL Cholesterol (Calc): 89 mg/dL (calc) (ref <130)
Total CHOL/HDL Ratio: 2.6 (calc) (ref <5.0)
Triglycerides: 84 mg/dL (ref <150)

## 2022-03-09 LAB — MAGNESIUM: Magnesium: 2 mg/dL (ref 1.5–2.5)

## 2022-03-09 LAB — MICROALBUMIN / CREATININE URINE RATIO
Creatinine, Urine: 68 mg/dL (ref 20–275)
Microalb Creat Ratio: 3 mcg/mg creat (ref <30)
Microalb, Ur: 0.2 mg/dL

## 2022-03-09 LAB — TSH: TSH: 1.15 mIU/L (ref 0.40–4.50)

## 2022-03-09 LAB — HEMOGLOBIN A1C
Hgb A1c MFr Bld: 6.1 % of total Hgb — ABNORMAL HIGH (ref <5.7)
Mean Plasma Glucose: 128 mg/dL
eAG (mmol/L): 7.1 mmol/L

## 2022-03-09 LAB — VITAMIN D 25 HYDROXY (VIT D DEFICIENCY, FRACTURES): Vit D, 25-Hydroxy: 97 ng/mL (ref 30–100)

## 2022-03-09 LAB — INSULIN, RANDOM: Insulin: 12.7 u[IU]/mL

## 2022-03-10 NOTE — Progress Notes (Signed)
Patient is aware of lab results and instructions. -e welch

## 2022-03-17 ENCOUNTER — Other Ambulatory Visit: Payer: Self-pay | Admitting: Internal Medicine

## 2022-03-18 ENCOUNTER — Ambulatory Visit: Payer: Medicare Other | Admitting: Nurse Practitioner

## 2022-03-18 NOTE — Progress Notes (Deleted)
? ?  Shawna [name] [facility] 05/25/1951 [id number] ? ? ?History:  71 y.o. G0 presents for breast and pelvic exam. No GYN complaints. Postmenopausal - no HRT, no bleeding. Normal pap and mammogram history. History of left DCIS, 1970 LSO.  ? ?Gynecologic History ?No LMP recorded. Patient is postmenopausal. ?  ?Contraception: post menopausal status ?Sexually active: No ? ?Health Maintenance ?Last Pap: 02/29/2020. Results were: Normal ?Last mammogram: 12/23/2021. Results were: Normal ?Last colonoscopy: 06/17/2018. Results were: Normal, 5-year recall d/t family hx ?Last Dexa: 04/01/2021 . Results were: T-score -1.7, FRAX 9.7% / 1.4% ? ?Past medical history, past surgical history, family history and social history were all reviewed and documented in the EPIC chart. Widowed. Retired. Father with history of colon cancer.  ? ?ROS:  A ROS was performed and pertinent positives and negatives are included. ? ?Exam: ? ?There were no vitals filed for this visit. ? ?There is no height or weight on file to calculate BMI. ? ?General appearance:  Normal ?Thyroid:  Symmetrical, normal in size, without palpable masses or nodularity. ?Respiratory ? Auscultation:  Clear without wheezing or rhonchi ?Cardiovascular ? Auscultation:  Regular rate, without rubs, murmurs or gallops ? Edema/varicosities:  Not grossly evident ?Abdominal ? Soft,nontender, without masses, guarding or rebound. ? Liver/spleen:  No organomegaly noted ? Hernia:  None appreciated ? Skin ? Inspection:  Grossly normal ?  ?Breasts: Examined lying and sitting.  ? Right: Without masses, retractions, discharge or axillary adenopathy. ? ? Left: Without masses, retractions, discharge or axillary adenopathy. ?Gentitourinary  ? Inguinal/mons:  Normal without inguinal adenopathy ? External genitalia:  Normal ? BUS/Urethra/Skene's glands:  Normal ? Vagina:  Atrophic changes ? Cervix:  Normal ? Uterus:  Difficult to palpate due to body habitus but no gross masses or tenderness ? Adnexa/parametria:    ?  Rt: Without masses or tenderness. ?  Lt: Without masses or tenderness. ? Anus and perineum: Normal ? Digital rectal exam: Normal sphincter tone without palpated masses or tenderness ? ?Assessment/Plan:  71 y.o. G0 for breast and pelvic exam.  ? ?Well female exam with routine gynecological exam - Education provided on SBEs, importance of preventative screenings, current guidelines, high calcium diet, regular exercise, and multivitamin daily. Labs with PCP.  ? ?Postmenopausal - no HRT, no bleeding.  ? ?Osteopenia of multiple sites - 03/2021 T-score -1.7. Continue Vitamin D supplement and increase exercise.  ? ?Screening for cervical cancer - Normal Pap history.  Discussed current guidelines and option to stop screening. We will reassess at 3-year interval as patient is unsure.  ? ?Screening for breast cancer - History of left DCIS. Continue annual screenings.  Normal breast exam today. ? ?Screening for colon cancer - 2019 colonoscopy. Will repeat at GI's recommended interval.  ? ?Return in 1 year for annual.  ? ? ?[name] DNP, 10:37 AM 03/18/2022 ? ?

## 2022-03-24 ENCOUNTER — Encounter: Payer: Self-pay | Admitting: Nurse Practitioner

## 2022-03-24 ENCOUNTER — Ambulatory Visit (INDEPENDENT_AMBULATORY_CARE_PROVIDER_SITE_OTHER): Payer: Medicare Other | Admitting: Nurse Practitioner

## 2022-03-24 VITALS — BP 126/80 | Ht 62.0 in | Wt 186.0 lb

## 2022-03-24 DIAGNOSIS — Z780 Asymptomatic menopausal state: Secondary | ICD-10-CM

## 2022-03-24 DIAGNOSIS — Z01419 Encounter for gynecological examination (general) (routine) without abnormal findings: Secondary | ICD-10-CM

## 2022-03-24 DIAGNOSIS — M8589 Other specified disorders of bone density and structure, multiple sites: Secondary | ICD-10-CM

## 2022-03-24 NOTE — Progress Notes (Signed)
? ?  Shawna [name] [facility] 1951-05-22 [id number] ? ? ?History:  71 y.o. G0 presents for breast and pelvic exam. No GYN complaints. Postmenopausal - no HRT, no bleeding. Normal pap and mammogram history. History of left DCIS in 2006, 1970 LSO for cyst.  ? ?Gynecologic History ?No LMP recorded. Patient is postmenopausal. ?  ?Contraception: post menopausal status ?Sexually active: No ? ?Health Maintenance ?Last Pap: 02/29/2020. Results were: Normal ?Last mammogram: 12/23/2021. Results were: Normal ?Last colonoscopy: 06/17/2018. Results were: Normal, 5-year recall d/t family hx ?Last Dexa: 04/01/2021 . Results were: T-score -1.7, FRAX 9.7% / 1.4% ? ?Past medical history, past surgical history, family history and social history were all reviewed and documented in the EPIC chart. Widowed. Retired. Father with history of colon cancer.  ? ?ROS:  A ROS was performed and pertinent positives and negatives are included. ? ?Exam: ? ?Vitals:  ? 03/24/22 1057  ?BP: 126/80  ?Weight: 186 lb (84.4 kg)  ?Height: 5\' 2"  (1.575 m)  ? ? ?Body mass index is 34.02 kg/m?. ? ?General appearance:  Normal ?Thyroid:  Symmetrical, normal in size, without palpable masses or nodularity. ?Respiratory ? Auscultation:  Clear without wheezing or rhonchi ?Cardiovascular ? Auscultation:  Regular rate, without rubs, murmurs or gallops ? Edema/varicosities:  Not grossly evident ?Abdominal ? Soft,nontender, without masses, guarding or rebound. ? Liver/spleen:  No organomegaly noted ? Hernia:  None appreciated ? Skin ? Inspection:  Grossly normal ?  ?Breasts: Examined lying and sitting.  ? Right: Without masses, retractions, discharge or axillary adenopathy. ? ? Left: Without masses, retractions, discharge or axillary adenopathy. ?Gentitourinary  ? Inguinal/mons:  Normal without inguinal adenopathy ? External genitalia:  Normal ? BUS/Urethra/Skene's glands:  Normal ? Vagina:  Atrophic changes ? Cervix:  Normal ? Uterus:  Difficult to palpate due to body habitus but no  gross masses or tenderness ? Adnexa/parametria:   ?  Rt: Without masses or tenderness. ?  Lt: Without masses or tenderness. ? Anus and perineum: Normal ? Digital rectal exam: Normal sphincter tone without palpated masses or tenderness ? ?Patient informed chaperone available to be present for breast and pelvic exam. Patient has requested no chaperone to be present. Patient has been advised what will be completed during breast and pelvic exam.  ? ?Assessment/Plan:  71 y.o. G0 for breast and pelvic exam.  ? ?Well female exam with routine gynecological exam - Education provided on SBEs, importance of preventative screenings, current guidelines, high calcium diet, regular exercise, and multivitamin daily. Labs with PCP.  ? ?Postmenopausal - no HRT, no bleeding.  ? ?Osteopenia of multiple sites - 03/2021 T-score -1.7. Continue Vitamin D supplement and increase exercise. Will plan for DXA next year. Recommend increasing exercise. ? ?Screening for cervical cancer - Normal Pap history.  Discussed current guidelines and option to stop screening. We will reassess at 3-year interval as patient is unsure.  ? ?Screening for breast cancer - History of left DCIS in 2006. Continue annual screenings.  Normal breast exam today. ? ?Screening for colon cancer - 2019 colonoscopy. Will repeat at GI's recommended interval. Father with history of colon cancer in his 50s.  ? ?Return in 1 year for annual.  ? ? ? ? ?[name] DNP, 11:25 AM 03/24/2022 ? ?

## 2022-06-03 DIAGNOSIS — H11153 Pinguecula, bilateral: Secondary | ICD-10-CM | POA: Diagnosis not present

## 2022-06-03 DIAGNOSIS — H04123 Dry eye syndrome of bilateral lacrimal glands: Secondary | ICD-10-CM | POA: Diagnosis not present

## 2022-07-09 ENCOUNTER — Ambulatory Visit (INDEPENDENT_AMBULATORY_CARE_PROVIDER_SITE_OTHER): Payer: Medicare Other | Admitting: Nurse Practitioner

## 2022-07-09 ENCOUNTER — Encounter: Payer: Self-pay | Admitting: Nurse Practitioner

## 2022-07-09 VITALS — BP 134/70 | HR 59 | Temp 96.8°F | Ht 62.5 in | Wt 181.0 lb

## 2022-07-09 DIAGNOSIS — N3000 Acute cystitis without hematuria: Secondary | ICD-10-CM

## 2022-07-09 DIAGNOSIS — R3915 Urgency of urination: Secondary | ICD-10-CM

## 2022-07-09 DIAGNOSIS — R300 Dysuria: Secondary | ICD-10-CM | POA: Diagnosis not present

## 2022-07-09 DIAGNOSIS — R350 Frequency of micturition: Secondary | ICD-10-CM | POA: Diagnosis not present

## 2022-07-09 MED ORDER — CIPROFLOXACIN HCL 250 MG PO TABS: 250.0000 mg | ORAL_TABLET | Freq: Two times a day (BID) | ORAL | 0 refills | Status: AC

## 2022-07-09 NOTE — Progress Notes (Signed)
Assessment and Plan:  TIMIKA MUENCH was seen today for an episodic.  Diagnoses and all order for this visit:  1. Acute cystitis without hematuria Continue to stay well hydrated. Suggest AZO OTC  Suggest Cranberry supplement  - ciprofloxacin (CIPRO) 250 MG tablet; Take 1 tablet (250 mg total) by mouth 2 (two) times daily for 10 days.  Dispense: 20 tablet; Refill: 0 - Urinalysis w microscopic + reflex cultur  2. Dysuria Stay well hydrated. AZO OTC Suggest Cranberry supplement  - ciprofloxacin (CIPRO) 250 MG tablet; Take 1 tablet (250 mg total) by mouth 2 (two) times daily for 10 days.  Dispense: 20 tablet; Refill: 0 - Urinalysis w microscopic + reflex cultur  3. Urinary frequency  - ciprofloxacin (CIPRO) 250 MG tablet; Take 1 tablet (250 mg total) by mouth 2 (two) times daily for 10 days.  Dispense: 20 tablet; Refill: 0 - Urinalysis w microscopic + reflex cultur  4. Urinary urgency  - ciprofloxacin (CIPRO) 250 MG tablet; Take 1 tablet (250 mg total) by mouth 2 (two) times daily for 10 days.  Dispense: 20 tablet; Refill: 0 - Urinalysis w microscopic + reflex cultur   Report to ER for any increase in fever, chills, N/V, blood in urine. Notify office for further evaluation and treatment, questions or concerns if s/s fail to improve. The risks and benefits of my recommendations, as well as other treatment options were discussed with the patient today. Questions were answered.  Further disposition pending results of labs. Discussed med's effects and SE's.    Over 15 minutes of exam, counseling, chart review, and critical decision making was performed.   Future Appointments  Date Time Provider [place]  08/24/2022  4:00 PM Alycia Rossetti, NP [facility] None  11/25/2022 11:30 AM Darrol Jump, NP [facility] None  03/15/2023 10:00 AM Unk Pinto, MD [facility] None     ------------------------------------------------------------------------------------------------------------------   HPI BP 134/70   Pulse (!) 59   Temp (!) 96.8 F (36 C)   Ht 5' 2.5" (1.588 m)   Wt 181 lb (82.1 kg)   SpO2 97%   BMI 32.58 kg/m     EDAN SERRATORE is a 71 y.o. female who complains of burning with urination, frequency, and urgency. She has had symptoms for 2 weeks. Patient denies back pain and vaginal discharge. Patient does not have a history of recurrent UTI. Patient does not have a history of pyelonephritis. She has not taken any medications for treatment.       Past Medical History:  Diagnosis Date   Cancer ([place])    Breast   DCIS (ductal carcinoma in situ) of breast    left   Hypercholesteremia    Osteopenia 04/2018   T score -1.7.  Overall stable from prior DEXA   Serous cyst of ovary    left     Allergies  Allergen Reactions   Nitrofurantoin Monohyd Macro Nausea Only    Current Outpatient Medications on File Prior to Visit  Medication Sig   aspirin 81 MG tablet Take 81 mg by mouth daily.   bisoprolol-hydrochlorothiazide (ZIAC) 5-6.25 MG tablet Take  1 tablet  every Morning  for BP   Cholecalciferol (VITAMIN D PO) Take 2,000 Units by mouth. Takes 10000 iu daily   Magnesium 250 MG TABS Take by mouth daily.   Multiple Vitamin (MULTIVITAMIN) tablet Take 1 tablet by mouth daily.   rosuvastatin (CRESTOR) 20 MG tablet Take 1 tablet Daily for Cholesterol   phentermine (ADIPEX-P) 37.5 MG  tablet 1/2 TO 1 TABLET EVERY MORNING FOR DIETING AND WEIGHT LOSS (Patient not taking: Reported on 07/09/2022)   No current facility-administered medications on file prior to visit.    ROS: all negative except what is noted in the HPI.    Physical Exam:  BP 134/70   Pulse (!) 59   Temp (!) 96.8 F (36 C)   Ht 5' 2.5" (1.588 m)   Wt 181 lb (82.1 kg)   SpO2 97%   BMI 32.58 kg/m   General Appearance: NAD.  Awake, conversant and cooperative. Eyes: PERRLA,  EOMs intact.  Sclera white.  Conjunctiva without erythema. Sinuses: No frontal/maxillary tenderness.  No nasal discharge. Nares patent.  ENT/Mouth: Ext aud canals clear.  Bilateral TMs w/DOL and without erythema or bulging. Hearing intact.  Posterior pharynx without swelling or exudate.  Tonsils without swelling or erythema.  Neck: Supple.  No masses, nodules or thyromegaly. Respiratory: Effort is regular with non-labored breathing. Breath sounds are equal bilaterally without rales, rhonchi, wheezing or stridor.  Cardio: RRR with no MRGs. Brisk peripheral pulses without edema.  Abdomen: Active BS in all four quadrants.  Soft and non-tender without guarding, rebound tenderness, hernias or masses. Lymphatics: Non tender without lymphadenopathy.  Musculoskeletal: Full ROM, 5/5 strength, normal ambulation.  No clubbing or cyanosis. Skin: Appropriate color for ethnicity. Warm without rashes, lesions, ecchymosis, ulcers.  Neuro: CN II-XII grossly normal. Normal muscle tone without cerebellar symptoms and intact sensation.   Psych: AO X 3,  appropriate mood and affect, insight and judgment.     Darrol Jump, NP 4:09 PM [facility]

## 2022-07-09 NOTE — Patient Instructions (Signed)
Urinary Tract Infection, Adult  A urinary tract infection (UTI) is an infection of any part of the urinary tract. The urinary tract includes the kidneys, ureters, bladder, and urethra. These organs make, store, and get rid of urine in the body. An upper UTI affects the ureters and kidneys. A lower UTI affects the bladder and urethra. What are the causes? Most urinary tract infections are caused by bacteria in your genital area around your urethra, where urine leaves your body. These bacteria grow and cause inflammation of your urinary tract. What increases the risk? You are more likely to develop this condition if: You have a urinary catheter that stays in place. You are not able to control when you urinate or have a bowel movement (incontinence). You are female and you: Use a spermicide or diaphragm for birth control. Have low estrogen levels. Are pregnant. You have certain genes that increase your risk. You are sexually active. You take antibiotic medicines. You have a condition that causes your flow of urine to slow down, such as: An enlarged prostate, if you are female. Blockage in your urethra. A kidney stone. A nerve condition that affects your bladder control (neurogenic bladder). Not getting enough to drink, or not urinating often. You have certain medical conditions, such as: Diabetes. A weak disease-fighting system (immunesystem). Sickle cell disease. Gout. Spinal cord injury. What are the signs or symptoms? Symptoms of this condition include: Needing to urinate right away (urgency). Frequent urination. This may include small amounts of urine each time you urinate. Pain or burning with urination. Blood in the urine. Urine that smells bad or unusual. Trouble urinating. Cloudy urine. Vaginal discharge, if you are female. Pain in the abdomen or the lower back. You may also have: Vomiting or a decreased appetite. Confusion. Irritability or tiredness. A fever or  chills. Diarrhea. The first symptom in older adults may be confusion. In some cases, they may not have any symptoms until the infection has worsened. How is this diagnosed? This condition is diagnosed based on your medical history and a physical exam. You may also have other tests, including: Urine tests. Blood tests. Tests for STIs (sexually transmitted infections). If you have had more than one UTI, a cystoscopy or imaging studies may be done to determine the cause of the infections. How is this treated? Treatment for this condition includes: Antibiotic medicine. Over-the-counter medicines to treat discomfort. Drinking enough water to stay hydrated. If you have frequent infections or have other conditions such as a kidney stone, you may need to see a health care provider who specializes in the urinary tract (urologist). In rare cases, urinary tract infections can cause sepsis. Sepsis is a life-threatening condition that occurs when the body responds to an infection. Sepsis is treated in the hospital with IV antibiotics, fluids, and other medicines. Follow these instructions at home:  Medicines Take over-the-counter and prescription medicines only as told by your health care provider. If you were prescribed an antibiotic medicine, take it as told by your health care provider. Do not stop using the antibiotic even if you start to feel better. General instructions Make sure you: Empty your bladder often and completely. Do not hold urine for long periods of time. Empty your bladder after sex. Wipe from front to back after urinating or having a bowel movement if you are female. Use each tissue only one time when you wipe. Drink enough fluid to keep your urine pale yellow. Keep all follow-up visits. This is important. Contact a health   care provider if: Your symptoms do not get better after 1-2 days. Your symptoms go away and then return. Get help right away if: You have severe pain in  your back or your lower abdomen. You have a fever or chills. You have nausea or vomiting. Summary A urinary tract infection (UTI) is an infection of any part of the urinary tract, which includes the kidneys, ureters, bladder, and urethra. Most urinary tract infections are caused by bacteria in your genital area. Treatment for this condition often includes antibiotic medicines. If you were prescribed an antibiotic medicine, take it as told by your health care provider. Do not stop using the antibiotic even if you start to feel better. Keep all follow-up visits. This is important. This information is not intended to replace advice given to you by your health care provider. Make sure you discuss any questions you have with your health care provider. Document Revised: 07/05/2020 Document Reviewed: 07/05/2020 Elsevier Patient Education  2023 [employer] Inc[employer]  

## 2022-07-11 LAB — URINALYSIS W MICROSCOPIC + REFLEX CULTURE
Bacteria, UA: NONE SEEN /HPF
Bilirubin Urine: NEGATIVE
Glucose, UA: NEGATIVE
Hgb urine dipstick: NEGATIVE
Hyaline Cast: NONE SEEN /LPF
Ketones, ur: NEGATIVE
Nitrites, Initial: NEGATIVE
Protein, ur: NEGATIVE
RBC / HPF: NONE SEEN /HPF (ref 0–2)
Specific Gravity, Urine: 1.009 (ref 1.001–1.035)
Squamous Epithelial / HPF: NONE SEEN /HPF (ref <=5)
pH: 6.5 (ref 5.0–8.0)

## 2022-07-11 LAB — URINE CULTURE
MICRO NUMBER:: 13736324
Result:: NO GROWTH
SPECIMEN QUALITY:: ADEQUATE

## 2022-07-11 LAB — CULTURE INDICATED

## 2022-08-17 ENCOUNTER — Ambulatory Visit (INDEPENDENT_AMBULATORY_CARE_PROVIDER_SITE_OTHER): Payer: Medicare Other | Admitting: Nurse Practitioner

## 2022-08-17 ENCOUNTER — Encounter: Payer: Self-pay | Admitting: Nurse Practitioner

## 2022-08-17 VITALS — BP 158/80 | HR 81 | Temp 96.6°F | Ht 62.5 in | Wt 187.4 lb

## 2022-08-17 DIAGNOSIS — J069 Acute upper respiratory infection, unspecified: Secondary | ICD-10-CM

## 2022-08-17 DIAGNOSIS — R0789 Other chest pain: Secondary | ICD-10-CM | POA: Diagnosis not present

## 2022-08-17 DIAGNOSIS — Z1152 Encounter for screening for COVID-19: Secondary | ICD-10-CM

## 2022-08-17 DIAGNOSIS — R0981 Nasal congestion: Secondary | ICD-10-CM

## 2022-08-17 LAB — POC COVID19 BINAXNOW: SARS Coronavirus 2 Ag: NEGATIVE

## 2022-08-17 MED ORDER — PROMETHAZINE-DM 6.25-15 MG/5ML PO SYRP: 5.0000 mL | ORAL_SOLUTION | Freq: Four times a day (QID) | ORAL | 0 refills | Status: DC | PRN

## 2022-08-17 MED ORDER — PREDNISONE 20 MG PO TABS: ORAL_TABLET | ORAL | 0 refills | Status: DC

## 2022-08-17 MED ORDER — AZITHROMYCIN 250 MG PO TABS: ORAL_TABLET | ORAL | 1 refills | Status: DC

## 2022-08-17 NOTE — Progress Notes (Signed)
Assessment and Plan:  Shawna [name] was seen today for an episodic visit.  Diagnoses and all order for this visit:  1. Encounter for screening for COVID-19 Negative  - POC COVID-19  2. Upper respiratory infection with cough and congestion Stay well hydrated. Rest  - predniSONE (DELTASONE) 20 MG tablet; Take 2 tabs (40 mg) for 3 days followed by 1 tab (20 mg) for 4 days.  Dispense: 10 tablet; Refill: 0 - azithromycin (ZITHROMAX) 250 MG tablet; Take 2 tablets (500 mg) on Day 1 followed by 1 tablet  (250 mg) daily until complete.  Dispense: 6 each; Refill: 1 - promethazine-dextromethorphan (PROMETHAZINE-DM) 6.25-15 MG/5ML syrup; Take 5 mLs by mouth 4 (four) times daily as needed for cough.  Dispense: 240 mL; Refill: 0  3. Nasal congestion  - predniSONE (DELTASONE) 20 MG tablet; Take 2 tabs (40 mg) for 3 days followed by 1 tab (20 mg) for 4 days.  Dispense: 10 tablet; Refill: 0 - azithromycin (ZITHROMAX) 250 MG tablet; Take 2 tablets (500 mg) on Day 1 followed by 1 tablet  (250 mg) daily until complete.  Dispense: 6 each; Refill: 1 - promethazine-dextromethorphan (PROMETHAZINE-DM) 6.25-15 MG/5ML syrup; Take 5 mLs by mouth 4 (four) times daily as needed for cough.  Dispense: 240 mL; Refill: 0  4. Chest tightness  - predniSONE (DELTASONE) 20 MG tablet; Take 2 tabs (40 mg) for 3 days followed by 1 tab (20 mg) for 4 days.  Dispense: 10 tablet; Refill: 0 - azithromycin (ZITHROMAX) 250 MG tablet; Take 2 tablets (500 mg) on Day 1 followed by 1 tablet  (250 mg) daily until complete.  Dispense: 6 each; Refill: 1 - promethazine-dextromethorphan (PROMETHAZINE-DM) 6.25-15 MG/5ML syrup; Take 5 mLs by mouth 4 (four) times daily as needed for cough.  Dispense: 240 mL; Refill: 0  Report to ER or call 911 for any increase in difficulty breathing.   Notify office for further evaluation and treatment, questions or concerns if s/s fail to improve. The risks and benefits of my recommendations, as well as  other treatment options were discussed with the patient today. Questions were answered.  Further disposition pending results of labs. Discussed med's effects and SE's.    Over 15 minutes of exam, counseling, chart review, and critical decision making was performed.   Future Appointments  Date Time Provider [place]  08/24/2022  4:00 PM Alycia Rossetti, NP [facility] None  11/25/2022 11:30 AM Darrol Jump, NP [facility] None  03/15/2023 10:00 AM Unk Pinto, MD [facility] None    ------------------------------------------------------------------------------------------------------------------   HPI BP (!) 158/80   Pulse 81   Temp (!) 96.6 F (35.9 C)   Ht 5' 2.5" (1.588 m)   Wt 187 lb 6.4 oz (85 kg)   SpO2 95%   BMI 33.73 kg/m    Patient complains of symptoms of a URI, possible sinusitis. Symptoms include sore throat, chest tightness, congestion, nasal congestion, and productive cough with  yellow colored sputum. Onset of symptoms was 4 days ago, and has been unchanged since that time. Treatment to date: cough suppressants. Denies fever, chills, N/V.  Past Medical History:  Diagnosis Date   Cancer ([place])    Breast   DCIS (ductal carcinoma in situ) of breast    left   Hypercholesteremia    Osteopenia 04/2018   T score -1.7.  Overall stable from prior DEXA   Serous cyst of ovary    left     Allergies  Allergen Reactions   Nitrofurantoin Monohyd Macro  Nausea Only    Current Outpatient Medications on File Prior to Visit  Medication Sig   aspirin 81 MG tablet Take 81 mg by mouth daily.   bisoprolol-hydrochlorothiazide (ZIAC) 5-6.25 MG tablet Take  1 tablet  every Morning  for BP   Cholecalciferol (VITAMIN D PO) Take 2,000 Units by mouth. Takes 10000 iu daily   Magnesium 250 MG TABS Take by mouth daily.   Multiple Vitamin (MULTIVITAMIN) tablet Take 1 tablet by mouth daily.   rosuvastatin (CRESTOR) 20 MG tablet Take 1 tablet Daily for Cholesterol    phentermine (ADIPEX-P) 37.5 MG tablet 1/2 TO 1 TABLET EVERY MORNING FOR DIETING AND WEIGHT LOSS (Patient not taking: Reported on 07/09/2022)   No current facility-administered medications on file prior to visit.    ROS: all negative except what is noted in the HPI.   Physical Exam:  BP (!) 158/80   Pulse 81   Temp (!) 96.6 F (35.9 C)   Ht 5' 2.5" (1.588 m)   Wt 187 lb 6.4 oz (85 kg)   SpO2 95%   BMI 33.73 kg/m   General Appearance: NAD.  Awake, conversant and cooperative. Eyes: PERRLA, EOMs intact.  Sclera white.  Conjunctiva without erythema. Sinuses: No frontal/maxillary tenderness.  No nasal discharge. Nares patent.  ENT/Mouth: Ext aud canals clear.  Bilateral TMs w/DOL and without erythema or bulging. Hearing intact.  Posterior pharynx without swelling or exudate.  Tonsils without swelling or erythema.  Neck: Supple.  No masses, nodules or thyromegaly. Respiratory: Effort is regular with non-labored breathing. Breath sounds are equal bilaterally without rales, rhonchi, wheezing or stridor.  Cardio: RRR with no MRGs. Brisk peripheral pulses without edema.  Abdomen: Active BS in all four quadrants.  Soft and non-tender without guarding, rebound tenderness, hernias or masses. Lymphatics: Non tender without lymphadenopathy.  Musculoskeletal: Full ROM, 5/5 strength, normal ambulation.  No clubbing or cyanosis. Skin: Appropriate color for ethnicity. Warm without rashes, lesions, ecchymosis, ulcers.  Neuro: CN II-XII grossly normal. Normal muscle tone without cerebellar symptoms and intact sensation.   Psych: AO X 3,  appropriate mood and affect, insight and judgment.     Darrol Jump, NP 11:50 AM [facility]

## 2022-08-17 NOTE — Patient Instructions (Signed)

## 2022-08-20 NOTE — Progress Notes (Unsigned)
MEDICARE ANNUAL WELLNESS VISIT AND 3 MONTH  Assessment:   Encounter for Medicare annual wellness exam  Essential hypertension - continue medications, DASH diet, exercise and monitor at home. Call if greater than 130/80.   History of ductal carcinoma in situ (DCIS) of breast, left, s/p resection UTD on mammograms Continue to monitor  Hyperlipidemia -continue medications, check lipids, decrease fatty foods, increase activity.   Abnormal Glucose Discussed general issues about diabetes pathophysiology and management., Educational material distributed., Suggested low cholesterol diet., Encouraged aerobic exercise., Discussed foot care., Reminded to get yearly retinal exam. Check A1C annually; monitor weight and serum glucose trends;  CMP/GFR  Vitamin D deficiency Continue supplement  Medication management CBC, CMP/GFR, magnesium   Obesity Long discussion about weight loss, diet, and exercise Discussed final goal weight and current weight loss goal (<165lb) Patient will work on high protein snacks Patient on phentermine with benefit and no SE, taking drug breaks; continue close follow up. Return in     Over 40 minutes of exam, counseling, chart review and critical decision making was performed Future Appointments  Date Time Provider [place]  08/24/2022  4:00 PM Alycia Rossetti, NP [facility] None  11/25/2022 11:30 AM Darrol Jump, NP [facility] None  03/15/2023 10:00 AM Unk Pinto, MD [facility] None     Plan:   During the course of the visit the patient was educated and counseled about appropriate screening and preventive services including:   Pneumococcal vaccine  Prevnar 13 Influenza vaccine Td vaccine Screening electrocardiogram Bone densitometry screening Colorectal cancer screening Diabetes screening Glaucoma screening Nutrition counseling  Advanced directives: requested   Subjective:  Shawna [name] is a 71 y.o. female who presents  for Medicare Annual Wellness Visit and OV.     Patient is s/p lumpectomy of L breast DCIS in 2006. Continues with annual mammograms via breast center.   BMI is There is no height or weight on file to calculate BMI., she is working on diet and exercise. She has lost 7 pounds in the past 3 months- currently on Phentermine.  She had some mild headaches with Topamax so had not been taking They deny palpitations, anxiety, trouble sleeping, elevated BP.  Wt Readings from Last 3 Encounters:  08/17/22 187 lb 6.4 oz (85 kg)  07/09/22 181 lb (82.1 kg)  03/24/22 186 lb (84.4 kg)    Typical breakfast/lunch: whole grain bread sandwich with turkey/lettuce/tomato and mustard, or an egg or cereal, fruit Typical snack: crackers Typical dinner: veggies with protein (baked or grilled)  Exercise: active around house and yard, but no intentional exercise  Water intake: unsweet tea, water   Her blood pressure has been controlled at home, today their BP is    She does not workout but has part time job and takes care of her husband. She denies chest pain, shortness of breath, dizziness.   She is on cholesterol medication, Crestor 20 mg QD and denies myalgias. Her cholesterol is at goal. The cholesterol last visit was:   Lab Results  Component Value Date   CHOL 144 03/06/2022   HDL 55 03/06/2022   LDLCALC 73 03/06/2022   TRIG 84 03/06/2022   CHOLHDL 2.6 03/06/2022    She has been working on diet and exercise for prediabetes, and denies paresthesia of the feet, polydipsia, polyuria and visual disturbances. Last A1C in the office was:  Lab Results  Component Value Date   HGBA1C 6.1 (H) 03/06/2022   Last GFR: Lab Results  Component Value Date  GFRNONAA 74 05/06/2021   Patient is on Vitamin D supplement.   Lab Results  Component Value Date   VD25OH 97 03/06/2022       Medication Review: Current Outpatient Medications on File Prior to Visit  Medication Sig Dispense Refill   aspirin 81 MG tablet  Take 81 mg by mouth daily.     azithromycin (ZITHROMAX) 250 MG tablet Take 2 tablets (500 mg) on Day 1 followed by 1 tablet  (250 mg) daily until complete. 6 each 1   bisoprolol-hydrochlorothiazide (ZIAC) 5-6.25 MG tablet Take  1 tablet  every Morning  for BP 90 tablet 3   Cholecalciferol (VITAMIN D PO) Take 2,000 Units by mouth. Takes 10000 iu daily     Magnesium 250 MG TABS Take by mouth daily.     Multiple Vitamin (MULTIVITAMIN) tablet Take 1 tablet by mouth daily.     phentermine (ADIPEX-P) 37.5 MG tablet 1/2 TO 1 TABLET EVERY MORNING FOR DIETING AND WEIGHT LOSS (Patient not taking: Reported on 07/09/2022) 30 tablet 0   predniSONE (DELTASONE) 20 MG tablet Take 2 tabs (40 mg) for 3 days followed by 1 tab (20 mg) for 4 days. 10 tablet 0   promethazine-dextromethorphan (PROMETHAZINE-DM) 6.25-15 MG/5ML syrup Take 5 mLs by mouth 4 (four) times daily as needed for cough. 240 mL 0   rosuvastatin (CRESTOR) 20 MG tablet Take 1 tablet Daily for Cholesterol 90 tablet 3   No current facility-administered medications on file prior to visit.    Allergies  Allergen Reactions   Nitrofurantoin Monohyd Macro Nausea Only    Current Problems (verified) Patient Active Problem List   Diagnosis Date Noted   Medication management 09/05/2014   Obesity (BMI 30.0-34.9) 09/05/2014   Essential hypertension 11/24/2013   Hyperlipidemia 11/24/2013   Abnormal glucose (prediabetes) 11/24/2013   Vitamin D deficiency 11/24/2013   DCIS (ductal carcinoma in situ) of breast, Left lumpectomy. 09/23/2005.     Screening Tests Immunization History  Administered Date(s) Administered   DTaP 06/26/1999   Influenza Split 09/05/2014, 10/04/2015   Influenza Whole 09/01/2013   Influenza, High Dose Seasonal PF 08/12/2017, 09/01/2018, 09/11/2019, 09/12/2020, 09/22/2021   Influenza-Unspecified 09/09/2016   PFIZER Comirnaty(Gray Top)Covid-19 Tri-Sucrose Vaccine 11/05/2020   PFIZER(Purple Top)SARS-COV-2 Vaccination 03/04/2020,  03/26/2020   PPD Test 03/05/2014, 03/11/2015, 03/30/2016   Pneumococcal Conjugate-13 04/22/2017   Pneumococcal Polysaccharide-23 02/04/2006, 09/01/2018   Tdap 09/13/2009   Zoster, Live 10/04/2015   Preventative care: Last colonoscopy: 06/17/2018, q 5 years, dad with colon cancer, Dr. Oletta Lamas due 2024 Last mammogram: 12/18/20 negative repeat 1 year Last pap smear/pelvic exam: 2019, due 12/2018 follows Dr. Phineas Real DEXA: 04/01/21, osteopenia Xray Cervical spine 2014  Prior vaccinations: TD or Tdap: 2010, declines, will get if needed Influenza: 09/22/21  Pneumococcal: 2019 Prevnar13: 2018 Shingles/Zostavax: 2016  Names of Other Physician/Practitioners you currently use: 1. [facility] here for primary care 2. Dr. Delman Cheadle, eye doctor, last visit 2022 3. Dr. Felipa Eth, dentist, last visit 2022, q 6 months   Patient Care Team: Unk Pinto, MD as PCP - General (Internal Medicine)  SURGICAL HISTORY She  has a past surgical history that includes Salpingoophorectomy (1978); Breast lumpectomy (Left, 2006); and Colonoscopy (N/A, 05/24/2013). FAMILY HISTORY Her family history includes Breast cancer in her paternal grandmother; Cancer in her sister; Colon cancer in her father; Diabetes in her sister; Heart disease in her sister; Hypertension in her mother. SOCIAL HISTORY She  reports that she has never smoked. She has never used smokeless tobacco.  She reports that she does not drink alcohol and does not use drugs.   MEDICARE WELLNESS OBJECTIVES: Physical activity:   Cardiac risk factors:   Depression/mood screen:      03/05/2022   11:11 PM  Depression screen PHQ 2/9  Decreased Interest 0  Down, Depressed, Hopeless 0  PHQ - 2 Score 0    ADLs:     03/05/2022   11:14 PM 11/13/2021    2:53 PM  In your present state of health, do you have any difficulty performing the following activities:  Hearing? 0 0  Vision? 0 0  Difficulty concentrating or  making decisions? 0 0  Walking or climbing stairs? 0 0  Dressing or bathing? 0 0  Doing errands, shopping? 0 0      Cognitive Testing  Alert? Yes  Normal Appearance?Yes  Oriented to person? Yes  Place? Yes   Time? Yes  Recall of three objects?  Yes  Can perform simple calculations? Yes  Displays appropriate judgment?Yes  Can read the correct time from a watch face?Yes  EOL planning:    Review of Systems  Constitutional: Negative.  Negative for malaise/fatigue and weight loss.  HENT: Negative.  Negative for hearing loss and tinnitus.   Eyes: Negative.  Negative for blurred vision and double vision.  Respiratory: Negative.  Negative for cough, sputum production, shortness of breath and wheezing.   Cardiovascular: Negative.  Negative for chest pain, palpitations, orthopnea, claudication, leg swelling and PND.  Gastrointestinal: Negative.  Negative for abdominal pain, blood in stool, constipation, diarrhea, heartburn, melena, nausea and vomiting.  Genitourinary: Negative.   Musculoskeletal: Negative.  Negative for falls, joint pain and myalgias.  Skin: Negative.  Negative for rash.  Neurological: Negative.  Negative for dizziness, tingling, sensory change, weakness and headaches.  Endo/Heme/Allergies: Negative.  Negative for polydipsia.  Psychiatric/Behavioral: Negative.  Negative for depression, memory loss, substance abuse and suicidal ideas. The patient is not nervous/anxious and does not have insomnia.   All other systems reviewed and are negative.    Objective:     There were no vitals filed for this visit.  There is no height or weight on file to calculate BMI.  General appearance: alert, no distress, WD/WN, female HEENT: normocephalic, sclerae anicteric, TMs pearly, nares patent, no discharge or erythema, pharynx normal Oral cavity: MMM, no lesions Neck: supple, no lymphadenopathy, no thyromegaly, no masses Heart: RRR, normal S1, S2, no murmurs Lungs: CTA bilaterally,  no wheezes, rhonchi, or rales Abdomen: +bs, soft, non tender, non distended, no masses, no hepatomegaly, no splenomegaly Musculoskeletal: nontender, no swelling, no obvious deformity Extremities:  no cyanosis, no clubbing, nonpitting edema bil of ankles/feet Pulses: 2+ symmetric, upper and lower extremities, normal cap refill Neurological: alert, oriented x 3, CN2-12 intact, strength normal upper extremities and lower extremities, sensation normal throughout, DTRs 2+ throughout, no cerebellar signs, gait normal Psychiatric: normal affect, behavior normal, pleasant   Medicare Attestation I have personally reviewed: The patient's medical and social history Their use of alcohol, tobacco or illicit drugs Their current medications and supplements The patient's functional ability including ADLs,fall risks, home safety risks, cognitive, and hearing and visual impairment Diet and physical activities Evidence for depression or mood disorders  The patient's weight, height, BMI, and visual acuity have been recorded in the chart.  I have made referrals, counseling, and provided education to the patient based on review of the above and I have provided the patient with a written personalized care plan for preventive services.  Alycia Rossetti, NP   08/20/2022

## 2022-08-24 ENCOUNTER — Ambulatory Visit (INDEPENDENT_AMBULATORY_CARE_PROVIDER_SITE_OTHER): Payer: Medicare Other | Admitting: Nurse Practitioner

## 2022-08-24 ENCOUNTER — Encounter: Payer: Self-pay | Admitting: Nurse Practitioner

## 2022-08-24 VITALS — BP 140/70 | HR 68 | Temp 97.5°F | Ht 62.5 in | Wt 188.0 lb

## 2022-08-24 DIAGNOSIS — Z86000 Personal history of in-situ neoplasm of breast: Secondary | ICD-10-CM

## 2022-08-24 DIAGNOSIS — E559 Vitamin D deficiency, unspecified: Secondary | ICD-10-CM | POA: Diagnosis not present

## 2022-08-24 DIAGNOSIS — E782 Mixed hyperlipidemia: Secondary | ICD-10-CM | POA: Diagnosis not present

## 2022-08-24 DIAGNOSIS — J4520 Mild intermittent asthma, uncomplicated: Secondary | ICD-10-CM

## 2022-08-24 DIAGNOSIS — R7309 Other abnormal glucose: Secondary | ICD-10-CM

## 2022-08-24 DIAGNOSIS — I10 Essential (primary) hypertension: Secondary | ICD-10-CM | POA: Diagnosis not present

## 2022-08-24 DIAGNOSIS — Z79899 Other long term (current) drug therapy: Secondary | ICD-10-CM | POA: Diagnosis not present

## 2022-08-24 DIAGNOSIS — Z0000 Encounter for general adult medical examination without abnormal findings: Secondary | ICD-10-CM

## 2022-08-24 DIAGNOSIS — E669 Obesity, unspecified: Secondary | ICD-10-CM | POA: Diagnosis not present

## 2022-08-24 NOTE — Patient Instructions (Addendum)
Trellegy daily one puff in the AM, rinse mouth out after use  Use Generic Zyrtec or Allegra x 2 weeks once a day  Allergies, Adult An allergy is a condition in which the body's defense system (immune system) comes in contact with an allergen and reacts to it. An allergen is anything that causes an allergic reaction. Allergens cause the immune system to make proteins for fighting infections (antibodies). These antibodies cause cells to release chemicals called histamines that set off the symptoms of an allergic reaction. Allergies often affect the nasal passages (allergic rhinitis), eyes (allergic conjunctivitis), skin (atopic dermatitis), and stomach. Allergies can be mild, moderate, or severe. They cannot spread from person to person. Allergies can develop at any age and may be outgrown. What are the causes? This condition is caused by allergens. Common allergens include: Outdoor allergens, such as pollen, car fumes, and mold. Indoor allergens, such as dust, smoke, mold, and pet dander. Other allergens, such as foods, medicines, scents, insect bites or stings, and other skin irritants. What increases the risk? You are more likely to develop this condition if you have: Family members with allergies. Family members who have any condition that may be caused by allergens, such as asthma. This may make you more likely to have other allergies. What are the signs or symptoms? Symptoms of this condition depend on the severity of the allergy. Mild to moderate symptoms Runny nose, stuffy nose (nasal congestion), or sneezing. Itchy mouth, ears, or throat. A feeling of mucus dripping down the back of your throat (postnasal drip). Sore throat. Itchy, red, watery, or puffy eyes. Skin rash, or itchy, red, swollen areas of skin (hives). Stomach cramps or bloating. Severe symptoms Severe allergies to food, medicine, or insect bites may cause anaphylaxis, which can be life-threatening. Symptoms include: A  red (flushed) face. Wheezing or coughing. Swollen lips, tongue, or mouth. Tight or swollen throat. Chest pain or tightness, or rapid heartbeat. Trouble breathing or shortness of breath. Pain in the abdomen, vomiting, or diarrhea. Dizziness or fainting. How is this diagnosed? This condition is diagnosed based on your symptoms, your family and medical history, and a physical exam. You may also have tests, including: Skin tests to see how your skin reacts to allergens that may be causing your symptoms. Tests include: Skin prick test. For this test, an allergen is introduced to your body through a small opening in the skin. Intradermal skin test. For this test, a small amount of allergen is injected under the first layer of your skin. Patch test. For this test, a small amount of allergen is placed on your skin. The area is covered and then checked after a few days. Blood tests. A challenge test. For this test, you will eat or breathe in a small amount of allergen to see if you have an allergic reaction. You may also be asked to: Keep a food diary. This is a record of all the foods, drinks, and symptoms you have in a day. Try an elimination diet. To do this: Remove certain foods from your diet. Add those foods back one by one to find out if any foods cause an allergic reaction. How is this treated?     Treatment for allergies depends on your symptoms. Treatment may include: Cold, wet cloths (cold compresses) to soothe itching and swelling. Eye drops or nasal sprays. Nasal irrigation to help clear your mucus or keep the nasal passages moist. A humidifier to add moisture to the air. Skin creams to treat  rashes or itching. Oral antihistamines or other medicines to block the reaction or to treat inflammation. Diet changes to remove foods that cause allergies. Being exposed again and again to tiny amounts of allergens to help you build a defense against it (tolerance). This is called  immunotherapy. Examples include: Allergy shot. You receive an injection that contains an allergen. Sublingual immunotherapy. You take a small dose of allergen under your tongue. Emergency injection for anaphylaxis. You give yourself a shot using a syringe (auto-injector) that contains the amount of medicine you need. Your health care provider will teach you how to give yourself an injection. Follow these instructions at home: Medicines  Take or apply over-the-counter and prescription medicines only as told by your health care provider. Always carry your auto-injector pen if you are at risk of anaphylaxis. Give yourself an injection as told by your health care provider. Eating and drinking Follow instructions from your health care provider about eating or drinking restrictions. Drink enough fluid to keep your urine pale yellow. General instructions Wear a medical alert bracelet or necklace to let others know that you have had anaphylaxis before. Avoid known allergens whenever possible. Keep all follow-up visits as told by your health care provider. This is important. Contact a health care provider if: Your symptoms do not get better with treatment. Get help right away if: You have symptoms of anaphylaxis. These include: Swollen mouth, tongue, or throat. Pain or tightness in your chest. Trouble breathing or shortness of breath. Dizziness or fainting. Severe abdominal pain, vomiting, or diarrhea. These symptoms may represent a serious problem that is an emergency. Do not wait to see if the symptoms will go away. Get medical help right away. Call your local emergency services (911 in the [place] Do not drive yourself to the hospital. Summary Take or apply over-the-counter and prescription medicines only as told by your health care provider. Avoid known allergens when possible. Always carry your auto-injector pen if you are at risk of anaphylaxis. Give yourself an injection as told by your  health care provider. Wear a medical alert bracelet or necklace to let others know that you have had anaphylaxis before. Anaphylaxis is a life-threatening emergency. Get help right away. This information is not intended to replace advice given to you by your health care provider. Make sure you discuss any questions you have with your health care provider. Document Revised: 07/22/2020 Document Reviewed: 10/04/2019 Elsevier Patient Education  [place].

## 2022-08-25 LAB — LIPID PANEL
Cholesterol: 147 mg/dL (ref <200)
HDL: 56 mg/dL (ref >=50)
LDL Cholesterol (Calc): 68 mg/dL (calc)
Non-HDL Cholesterol (Calc): 91 mg/dL (calc) (ref <130)
Total CHOL/HDL Ratio: 2.6 (calc) (ref <5.0)
Triglycerides: 150 mg/dL — ABNORMAL HIGH (ref <150)

## 2022-08-25 LAB — HEMOGLOBIN A1C
Hgb A1c MFr Bld: 6 % of total Hgb — ABNORMAL HIGH (ref <5.7)
Mean Plasma Glucose: 126 mg/dL
eAG (mmol/L): 7 mmol/L

## 2022-08-25 LAB — CBC WITH DIFFERENTIAL/PLATELET
Absolute Monocytes: 274 cells/uL (ref 200–950)
Basophils Absolute: 36 cells/uL (ref 0–200)
Basophils Relative: 0.3 %
Eosinophils Absolute: 0 cells/uL — ABNORMAL LOW (ref 15–500)
Eosinophils Relative: 0 %
HCT: 38.9 % (ref 35.0–45.0)
Hemoglobin: 13.1 g/dL (ref 11.7–15.5)
Lymphs Abs: 1726 cells/uL (ref 850–3900)
MCH: 30.6 pg (ref 27.0–33.0)
MCHC: 33.7 g/dL (ref 32.0–36.0)
MCV: 90.9 fL (ref 80.0–100.0)
MPV: 10.9 fL (ref 7.5–12.5)
Monocytes Relative: 2.3 %
Neutro Abs: 9865 cells/uL — ABNORMAL HIGH (ref 1500–7800)
Neutrophils Relative %: 82.9 %
Platelets: 227 10*3/uL (ref 140–400)
RBC: 4.28 10*6/uL (ref 3.80–5.10)
RDW: 13.5 % (ref 11.0–15.0)
Total Lymphocyte: 14.5 %
WBC: 11.9 10*3/uL — ABNORMAL HIGH (ref 3.8–10.8)

## 2022-08-25 LAB — COMPLETE METABOLIC PANEL WITH GFR
AG Ratio: 2 (calc) (ref 1.0–2.5)
ALT: 60 U/L — ABNORMAL HIGH (ref 6–29)
AST: 23 U/L (ref 10–35)
Albumin: 4.3 g/dL (ref 3.6–5.1)
Alkaline phosphatase (APISO): 71 U/L (ref 37–153)
BUN: 23 mg/dL (ref 7–25)
CO2: 24 mmol/L (ref 20–32)
Calcium: 9.5 mg/dL (ref 8.6–10.4)
Chloride: 105 mmol/L (ref 98–110)
Creat: 0.94 mg/dL (ref 0.60–1.00)
Globulin: 2.2 g/dL (calc) (ref 1.9–3.7)
Glucose, Bld: 132 mg/dL — ABNORMAL HIGH (ref 65–99)
Potassium: 4.7 mmol/L (ref 3.5–5.3)
Sodium: 140 mmol/L (ref 135–146)
Total Bilirubin: 0.4 mg/dL (ref 0.2–1.2)
Total Protein: 6.5 g/dL (ref 6.1–8.1)
eGFR: 65 mL/min/{1.73_m2} (ref >=60)

## 2022-09-23 ENCOUNTER — Ambulatory Visit (INDEPENDENT_AMBULATORY_CARE_PROVIDER_SITE_OTHER): Payer: Medicare Other

## 2022-09-23 VITALS — Temp 98.0°F

## 2022-09-23 DIAGNOSIS — Z23 Encounter for immunization: Secondary | ICD-10-CM | POA: Diagnosis not present

## 2022-09-30 DIAGNOSIS — H52203 Unspecified astigmatism, bilateral: Secondary | ICD-10-CM | POA: Diagnosis not present

## 2022-09-30 DIAGNOSIS — H1789 Other corneal scars and opacities: Secondary | ICD-10-CM | POA: Diagnosis not present

## 2022-09-30 DIAGNOSIS — Z23 Encounter for immunization: Secondary | ICD-10-CM | POA: Diagnosis not present

## 2022-09-30 DIAGNOSIS — H5213 Myopia, bilateral: Secondary | ICD-10-CM | POA: Diagnosis not present

## 2022-11-17 ENCOUNTER — Other Ambulatory Visit: Payer: Self-pay | Admitting: Internal Medicine

## 2022-11-17 DIAGNOSIS — Z1231 Encounter for screening mammogram for malignant neoplasm of breast: Secondary | ICD-10-CM

## 2022-11-25 ENCOUNTER — Ambulatory Visit (INDEPENDENT_AMBULATORY_CARE_PROVIDER_SITE_OTHER): Payer: Medicare Other | Admitting: Nurse Practitioner

## 2022-11-25 ENCOUNTER — Encounter: Payer: Self-pay | Admitting: Nurse Practitioner

## 2022-11-25 VITALS — BP 138/80 | HR 66 | Temp 96.9°F | Ht 62.5 in | Wt 185.0 lb

## 2022-11-25 DIAGNOSIS — R7309 Other abnormal glucose: Secondary | ICD-10-CM | POA: Diagnosis not present

## 2022-11-25 DIAGNOSIS — R6889 Other general symptoms and signs: Secondary | ICD-10-CM | POA: Diagnosis not present

## 2022-11-25 DIAGNOSIS — E559 Vitamin D deficiency, unspecified: Secondary | ICD-10-CM | POA: Diagnosis not present

## 2022-11-25 DIAGNOSIS — E782 Mixed hyperlipidemia: Secondary | ICD-10-CM | POA: Diagnosis not present

## 2022-11-25 DIAGNOSIS — I10 Essential (primary) hypertension: Secondary | ICD-10-CM | POA: Diagnosis not present

## 2022-11-25 DIAGNOSIS — Z0001 Encounter for general adult medical examination with abnormal findings: Secondary | ICD-10-CM

## 2022-11-25 DIAGNOSIS — Z86000 Personal history of in-situ neoplasm of breast: Secondary | ICD-10-CM

## 2022-11-25 DIAGNOSIS — E669 Obesity, unspecified: Secondary | ICD-10-CM | POA: Diagnosis not present

## 2022-11-25 DIAGNOSIS — Z79899 Other long term (current) drug therapy: Secondary | ICD-10-CM

## 2022-11-25 NOTE — Progress Notes (Signed)
MEDICARE ANNUAL WELLNESS VISIT AND 3 MONTH  Assessment:   Encounter for Medicare annual wellness exam  Essential hypertension Discussed DASH (Dietary Approaches to Stop Hypertension) DASH diet is lower in sodium than a typical American diet. Cut back on foods that are high in saturated fat, cholesterol, and trans fats. Eat more whole-grain foods, fish, poultry, and nuts Remain active and exercise as tolerated daily.  Monitor BP at home-Call if greater than 130/80.  Check CMP/CBC   History of ductal carcinoma in situ (DCIS) of breast, left, s/p resection UTD on mammograms Continue to monitor  Hyperlipidemia Discussed lifestyle modifications. Recommended diet heavy in fruits and veggies, omega 3's. Decrease consumption of animal meats, cheeses, and dairy products. Remain active and exercise as tolerated. Continue to monitor. Check lipids/TSH   Prediabetes Education: Reviewed 'ABCs' of diabetes management  Discussed goals to be met and/or maintained include A1C (<7) Blood pressure (<130/80) Cholesterol (LDL <70) Continue Eye Exam yearly  Continue Dental Exam Q6 mo Discussed dietary recommendations Discussed Physical Activity recommendations Check A1C   Vitamin D deficiency Continue supplement Monitor levels  Medication management All medications discussed and reviewed in full. All questions and concerns regarding medications addressed.    Obesity Discussed appropriate BMI Diet modification. Physical activity. Encouraged/praised to build confidence.  Elevated triglycerides Discussed lifestyle modifications. Recommended diet heavy in fruits and veggies, omega 3's. Decrease consumption of animal meats, cheeses, and dairy products. Remain active and exercise as tolerated. Continue to monitor. Check lipids/TSH  Orders Placed This Encounter  Procedures   CBC with Differential/Platelet   COMPLETE METABOLIC PANEL WITH GFR   Lipid panel   VITAMIN D 25 Hydroxy  (Vit-D Deficiency, Fractures)   Hemoglobin A1c   Notify office for further evaluation and treatment, questions or concerns if any reported s/s fail to improve.   The patient was advised to call back or seek an in-person evaluation if any symptoms worsen or if the condition fails to improve as anticipated.   Further disposition pending results of labs. Discussed med's effects and SE's.    I discussed the assessment and treatment plan with the patient. The patient was provided an opportunity to ask questions and all were answered. The patient agreed with the plan and demonstrated an understanding of the instructions.  Discussed med's effects and SE's. Screening labs and tests as requested with regular follow-up as recommended.  I provided 25 minutes of face-to-face time during this encounter including counseling, chart review, and critical decision making was preformed.  Future Appointments  Date Time Provider [place]  01/12/2023 12:10 PM [facility] 2 [facility]  03/15/2023 10:00 AM Unk Pinto, MD [facility] None  11/26/2023 11:00 AM Darrol Jump, NP [facility] None    Subjective:  Shawna [name] is a 71 y.o. female who presents for general follow up.     Overall she reports feeling well today.  She has no new or additional concerns.   Patient is s/p lumpectomy of L breast DCIS in 2006. Continues with annual mammograms via breast center.   BMI is Body mass index is 33.3 kg/m., she is working on diet and exercise.  Wt Readings from Last 3 Encounters:  11/25/22 185 lb (83.9 kg)  08/24/22 188 lb (85.3 kg)  08/17/22 187 lb 6.4 oz (85 kg)    Her blood pressure has been controlled at home, today their BP is BP: 138/80  She does not workout but has part time job and takes care of her husband. She denies chest  pain, shortness of breath, dizziness.   She is on cholesterol medication, Crestor 20 mg QD and denies myalgias. Her cholesterol is at goal. The  cholesterol last visit was:   Lab Results  Component Value Date   CHOL 147 08/24/2022   HDL 56 08/24/2022   LDLCALC 68 08/24/2022   TRIG 150 (H) 08/24/2022   CHOLHDL 2.6 08/24/2022    She has been working on diet and exercise for prediabetes, and denies paresthesia of the feet, polydipsia, polyuria and visual disturbances. Last A1C in the office was:  Lab Results  Component Value Date   HGBA1C 6.0 (H) 08/24/2022   Last GFR: Lab Results  Component Value Date   GFRNONAA 74 05/06/2021   Patient is on Vitamin D supplement.   Lab Results  Component Value Date   VD25OH 97 03/06/2022       Medication Review: Current Outpatient Medications on File Prior to Visit  Medication Sig Dispense Refill   aspirin 81 MG tablet Take 81 mg by mouth daily.     bisoprolol-hydrochlorothiazide (ZIAC) 5-6.25 MG tablet Take  1 tablet  every Morning  for BP 90 tablet 3   Cholecalciferol (VITAMIN D PO) Take 2,000 Units by mouth. Takes 10000 iu daily     Magnesium 250 MG TABS Take by mouth daily.     Multiple Vitamin (MULTIVITAMIN) tablet Take 1 tablet by mouth daily.     phentermine (ADIPEX-P) 37.5 MG tablet 1/2 TO 1 TABLET EVERY MORNING FOR DIETING AND WEIGHT LOSS 30 tablet 0   rosuvastatin (CRESTOR) 20 MG tablet Take 1 tablet Daily for Cholesterol 90 tablet 3   promethazine-dextromethorphan (PROMETHAZINE-DM) 6.25-15 MG/5ML syrup Take 5 mLs by mouth 4 (four) times daily as needed for cough. (Patient not taking: Reported on 08/24/2022) 240 mL 0   No current facility-administered medications on file prior to visit.    Allergies  Allergen Reactions   Nitrofurantoin Monohyd Macro Nausea Only    Current Problems (verified) Patient Active Problem List   Diagnosis Date Noted   Medication management 09/05/2014   Obesity (BMI 30.0-34.9) 09/05/2014   Essential hypertension 11/24/2013   Hyperlipidemia 11/24/2013   Abnormal glucose (prediabetes) 11/24/2013   Vitamin D deficiency 11/24/2013   DCIS  (ductal carcinoma in situ) of breast, Left lumpectomy. 09/23/2005.     Screening Tests Immunization History  Administered Date(s) Administered   DTaP 06/26/1999   Influenza Split 09/05/2014, 10/04/2015   Influenza Whole 09/01/2013   Influenza, High Dose Seasonal PF 08/12/2017, 09/01/2018, 09/11/2019, 09/12/2020, 09/22/2021, 09/23/2022   Influenza-Unspecified 09/09/2016   PFIZER Comirnaty(Gray Top)Covid-19 Tri-Sucrose Vaccine 11/05/2020   PFIZER(Purple Top)SARS-COV-2 Vaccination 03/04/2020, 03/26/2020   PPD Test 03/05/2014, 03/11/2015, 03/30/2016   Pneumococcal Conjugate-13 04/22/2017   Pneumococcal Polysaccharide-23 02/04/2006, 09/01/2018   Respiratory Syncytial Virus Vaccine,Recomb Aduvanted(Arexvy) 09/06/2022   Tdap 09/13/2009   Zoster, Live 10/04/2015   Colonoscopy: 2019 Recall Due 2024 Pap: 2015 last Sees GYN yearly Mammogram: 12/2021  Flu:  09/2022 Covid:  Booster 10/2022 RSV:  10/2022  Names of Other Physician/Practitioners you currently use: 1. [facility] here for primary care 2. Dr. Delman Cheadle, eye doctor, last visit 2023 3. Dr. Felipa Eth, dentist, last visit 2023, q 6 months 4. Dr. Sherilyn Banker Hearing test completed 11/2022   Patient Care Team: Unk Pinto, MD as PCP - General (Internal Medicine)  SURGICAL HISTORY She  has a past surgical history that includes Salpingoophorectomy (1978); Breast lumpectomy (Left, 2006); and Colonoscopy (N/A, 05/24/2013). FAMILY HISTORY Her family history includes Breast cancer  in her paternal grandmother; Cancer in her sister; Colon cancer in her father; Diabetes in her sister; Heart disease in her sister; Hypertension in her mother. SOCIAL HISTORY She  reports that she has never smoked. She has never used smokeless tobacco. She reports that she does not drink alcohol and does not use drugs.  Review of Systems  Constitutional: Negative.  Negative for malaise/fatigue and weight loss.  HENT:  Negative.  Negative for hearing loss and tinnitus.   Eyes: Negative.  Negative for blurred vision and double vision.  Respiratory: Negative.  Negative for cough, sputum production, shortness of breath and wheezing.   Cardiovascular: Negative.  Negative for chest pain, palpitations, orthopnea, claudication, leg swelling and PND.  Gastrointestinal: Negative.  Negative for abdominal pain, blood in stool, constipation, diarrhea, heartburn, melena, nausea and vomiting.  Genitourinary: Negative.   Musculoskeletal: Negative.  Negative for falls, joint pain and myalgias.  Skin: Negative.  Negative for rash.  Neurological: Negative.  Negative for dizziness, tingling, sensory change, weakness and headaches.  Endo/Heme/Allergies: Negative.  Negative for polydipsia.  Psychiatric/Behavioral: Negative.  Negative for depression, memory loss, substance abuse and suicidal ideas. The patient is not nervous/anxious and does not have insomnia.   All other systems reviewed and are negative.    Objective:     Today's Vitals   11/25/22 1128  BP: 138/80  Pulse: 66  Temp: (!) 96.9 F (36.1 C)  SpO2: 98%  Weight: 185 lb (83.9 kg)  Height: 5' 2.5" (1.588 m)   Body mass index is 33.3 kg/m.  General appearance: alert, no distress, WD/WN, female HEENT: normocephalic, sclerae anicteric, TMs pearly, nares patent, no discharge or erythema, pharynx normal Oral cavity: MMM, no lesions Neck: supple, no lymphadenopathy, no thyromegaly, no masses Heart: RRR, normal S1, S2, no murmurs Lungs: CTA bilaterally, no wheezes, rhonchi, or rales Abdomen: +bs, soft, non tender, non distended, no masses, no hepatomegaly, no splenomegaly Musculoskeletal: nontender, no swelling, no obvious deformity Extremities:  no cyanosis, no clubbing, nonpitting edema bil of ankles/feet Pulses: 2+ symmetric, upper and lower extremities, normal cap refill Neurological: alert, oriented x 3, CN2-12 intact, strength normal upper extremities  and lower extremities, sensation normal throughout, DTRs 2+ throughout, no cerebellar signs, gait normal Psychiatric: normal affect, behavior normal, pleasant   Shawna Abed, NP   11/25/2022

## 2022-11-25 NOTE — Patient Instructions (Signed)

## 2022-11-26 ENCOUNTER — Telehealth: Payer: Self-pay | Admitting: Nurse Practitioner

## 2022-11-26 DIAGNOSIS — I10 Essential (primary) hypertension: Secondary | ICD-10-CM

## 2022-11-26 LAB — COMPLETE METABOLIC PANEL WITH GFR
AG Ratio: 1.9 (calc) (ref 1.0–2.5)
ALT: 13 U/L (ref 6–29)
AST: 19 U/L (ref 10–35)
Albumin: 4.3 g/dL (ref 3.6–5.1)
Alkaline phosphatase (APISO): 69 U/L (ref 37–153)
BUN/Creatinine Ratio: 22 (calc) (ref 6–22)
BUN: 22 mg/dL (ref 7–25)
CO2: 29 mmol/L (ref 20–32)
Calcium: 10 mg/dL (ref 8.6–10.4)
Chloride: 102 mmol/L (ref 98–110)
Creat: 1.01 mg/dL — ABNORMAL HIGH (ref 0.60–1.00)
Globulin: 2.3 g/dL (calc) (ref 1.9–3.7)
Glucose, Bld: 91 mg/dL (ref 65–99)
Potassium: 5.2 mmol/L (ref 3.5–5.3)
Sodium: 140 mmol/L (ref 135–146)
Total Bilirubin: 0.5 mg/dL (ref 0.2–1.2)
Total Protein: 6.6 g/dL (ref 6.1–8.1)
eGFR: 60 mL/min/{1.73_m2} (ref >=60)

## 2022-11-26 LAB — CBC WITH DIFFERENTIAL/PLATELET
Absolute Monocytes: 494 cells/uL (ref 200–950)
Basophils Absolute: 43 cells/uL (ref 0–200)
Basophils Relative: 0.7 %
Eosinophils Absolute: 98 cells/uL (ref 15–500)
Eosinophils Relative: 1.6 %
HCT: 40.6 % (ref 35.0–45.0)
Hemoglobin: 14.2 g/dL (ref 11.7–15.5)
Lymphs Abs: 2098 cells/uL (ref 850–3900)
MCH: 31.8 pg (ref 27.0–33.0)
MCHC: 35 g/dL (ref 32.0–36.0)
MCV: 91 fL (ref 80.0–100.0)
MPV: 10.6 fL (ref 7.5–12.5)
Monocytes Relative: 8.1 %
Neutro Abs: 3367 cells/uL (ref 1500–7800)
Neutrophils Relative %: 55.2 %
Platelets: 171 10*3/uL (ref 140–400)
RBC: 4.46 10*6/uL (ref 3.80–5.10)
RDW: 12.8 % (ref 11.0–15.0)
Total Lymphocyte: 34.4 %
WBC: 6.1 10*3/uL (ref 3.8–10.8)

## 2022-11-26 LAB — HEMOGLOBIN A1C
Hgb A1c MFr Bld: 6.1 % of total Hgb — ABNORMAL HIGH (ref <5.7)
Mean Plasma Glucose: 128 mg/dL
eAG (mmol/L): 7.1 mmol/L

## 2022-11-26 LAB — LIPID PANEL
Cholesterol: 138 mg/dL (ref <200)
HDL: 56 mg/dL (ref >=50)
LDL Cholesterol (Calc): 67 mg/dL (calc)
Non-HDL Cholesterol (Calc): 82 mg/dL (calc) (ref <130)
Total CHOL/HDL Ratio: 2.5 (calc) (ref <5.0)
Triglycerides: 73 mg/dL (ref <150)

## 2022-11-26 LAB — VITAMIN D 25 HYDROXY (VIT D DEFICIENCY, FRACTURES): Vit D, 25-Hydroxy: 84 ng/mL (ref 30–100)

## 2022-11-26 MED ORDER — ROSUVASTATIN CALCIUM 20 MG PO TABS: ORAL_TABLET | ORAL | 3 refills | Status: DC

## 2022-11-26 MED ORDER — BISOPROLOL-HYDROCHLOROTHIAZIDE 5-6.25 MG PO TABS: ORAL_TABLET | ORAL | 3 refills | Status: DC

## 2022-11-26 NOTE — Addendum Note (Signed)
Addended by: Chancy Hurter on: 11/26/2022 04:29 PM   Modules accepted: Orders

## 2022-11-26 NOTE — Telephone Encounter (Signed)
Pt is requesting a refill on Rosuvastatin and Ziac to Shawna [name]

## 2023-01-08 ENCOUNTER — Ambulatory Visit: Payer: Medicare Other | Admitting: Nurse Practitioner

## 2023-01-08 ENCOUNTER — Other Ambulatory Visit: Payer: Self-pay

## 2023-01-08 ENCOUNTER — Encounter: Payer: Self-pay | Admitting: Nurse Practitioner

## 2023-01-08 VITALS — HR 72 | Temp 98.1°F

## 2023-01-08 DIAGNOSIS — R6889 Other general symptoms and signs: Secondary | ICD-10-CM | POA: Diagnosis not present

## 2023-01-08 DIAGNOSIS — N6310 Unspecified lump in the right breast, unspecified quadrant: Secondary | ICD-10-CM

## 2023-01-08 DIAGNOSIS — N6313 Unspecified lump in the right breast, lower outer quadrant: Secondary | ICD-10-CM

## 2023-01-08 DIAGNOSIS — U071 COVID-19: Secondary | ICD-10-CM | POA: Diagnosis not present

## 2023-01-08 DIAGNOSIS — Z1152 Encounter for screening for COVID-19: Secondary | ICD-10-CM | POA: Diagnosis not present

## 2023-01-08 LAB — POCT INFLUENZA A/B
Influenza A, POC: NEGATIVE
Influenza B, POC: NEGATIVE

## 2023-01-08 LAB — POC COVID19 BINAXNOW: SARS Coronavirus 2 Ag: POSITIVE — AB

## 2023-01-08 MED ORDER — AZITHROMYCIN 250 MG PO TABS: ORAL_TABLET | ORAL | 1 refills | Status: DC

## 2023-01-08 MED ORDER — DEXAMETHASONE 1 MG PO TABS: ORAL_TABLET | ORAL | 0 refills | Status: DC

## 2023-01-08 NOTE — Progress Notes (Signed)
THIS ENCOUNTER IS A VIRTUAL VISIT DUE TO COVID-19 - PATIENT WAS NOT SEEN IN THE OFFICE.  PATIENT HAS CONSENTED TO VIRTUAL VISIT / TELEMEDICINE VISIT   Virtual Visit via telephone Note  I connected with  Shawna [name] on 01/08/2023 by telephone.  I verified that I am speaking with the correct person using two identifiers.    I discussed the limitations of evaluation and management by telemedicine and the availability of in person appointments. The patient expressed understanding and agreed to proceed.  History of Present Illness:  Pulse 72   Temp 98.1 F (36.7 C)   SpO2 98%  72 y.o. patient contacted office reporting URI sx . she tested positive by test in office parking lot. OV was conducted by telephone to minimize exposure. This patient was vaccinated for covid 19, last 11/05/20  Sx began 2  weeks ago with congestion, productive cough of yellow color. Denies body aches, fatigue, fever, nausea, vomiting and diarrhea  Treatments tried so far: tylenol severe cold and sinus  Exposures: unknown- cruise 3 weeks ago   She also reports that 3 weeks ago she hit her right breast and it developed a large bruise, bruise is resolving but has felt a lump adjacent to the nipple on that side. She does have a history of breast cancer and is concerned   Does have an upcoming mammogram  Medications   Current Outpatient Medications (Cardiovascular):    bisoprolol-hydrochlorothiazide (ZIAC) 5-6.25 MG tablet, Take  1 tablet  every Morning  for BP   rosuvastatin (CRESTOR) 20 MG tablet, Take 1 tablet Daily for Cholesterol  Current Outpatient Medications (Respiratory):    promethazine-dextromethorphan (PROMETHAZINE-DM) 6.25-15 MG/5ML syrup, Take 5 mLs by mouth 4 (four) times daily as needed for cough. (Patient not taking: Reported on 08/24/2022)  Current Outpatient Medications (Analgesics):    aspirin 81 MG tablet, Take 81 mg by mouth daily.   Current Outpatient Medications (Other):     Cholecalciferol (VITAMIN D PO), Take 2,000 Units by mouth. Takes 10000 iu daily   Magnesium 250 MG TABS, Take by mouth daily.   Multiple Vitamin (MULTIVITAMIN) tablet, Take 1 tablet by mouth daily.   phentermine (ADIPEX-P) 37.5 MG tablet, 1/2 TO 1 TABLET EVERY MORNING FOR DIETING AND WEIGHT LOSS  Allergies:  Allergies  Allergen Reactions   Nitrofurantoin Monohyd Macro Nausea Only    Problem list She has DCIS (ductal carcinoma in situ) of breast, Left lumpectomy. 09/23/2005.; Essential hypertension; Hyperlipidemia; Abnormal glucose (prediabetes); Vitamin D deficiency; Medication management; and Obesity (BMI 30.0-34.9) on their problem list.   Social History:   reports that she has never smoked. She has never used smokeless tobacco. She reports that she does not drink alcohol and does not use drugs.  Observations/Objective:  General : Well sounding patient in no apparent distress HEENT: no hoarseness, no cough for duration of visit Lungs: speaks in complete sentences, no audible wheezing, no apparent distress Neurological: alert, oriented x 3 Psychiatric: pleasant, judgement appropriate   Assessment and Plan:  Covid 19 Covid 19 positive per rapid screening test in office parking lot today Risk factors include: has DCIS (ductal carcinoma in situ) of breast, Left lumpectomy. 09/23/2005.; Essential hypertension; Hyperlipidemia; Abnormal glucose (prediabetes); Vitamin D deficiency; Medication management; and Obesity (BMI 30.0-34.9) on their problem list.  Symptoms are: mild Immue support reviewed  Take tylenol PRN temp 101+ Push hydration Regular ambulation or calf exercises exercises for clot prevention and 81 mg ASA unless contraindicated Sx supportive therapy suggested Follow up via  mychart or telephone if needed Advised patient obtain O2 monitor; present to ED if persistently <90% or with severe dyspnea, CP, fever uncontrolled by tylenol, confusion, sudden decline Should remain in  isolation 5 days from testing positive and then wear a mask when around other people for the following 5 days  [name] was seen today for acute visit.  Diagnoses and all orders for this visit:  Flu-like symptoms -     POCT Influenza A/B- negative  Encounter for screening for COVID-19 -     POC COVID-19- positive  COVID -     dexamethasone (DECADRON) 1 MG tablet; Take 3 tabs for 3 days, 2 tabs for 3 days 1 tab for 5 days. Take with food. -     azithromycin (ZITHROMAX) 250 MG tablet; Take 2 tablets (500 mg) on  Day 1,  followed by 1 tablet (250 mg) once daily on Days 2 through 5.   Unspecified lump in the right breast, lower outer quadrant Keep regularly scheduled mammogram appointment and will add diagnostic in case additional images are needed -     MM DIAG BREAST TOMO BILATERAL; Future     Follow Up Instructions:  I discussed the assessment and treatment plan with the patient. The patient was provided an opportunity to ask questions and all were answered. The patient agreed with the plan and demonstrated an understanding of the instructions.   The patient was advised to call back or seek an in-person evaluation if the symptoms worsen or if the condition fails to improve as anticipated.  I provided 20 minutes of non-face-to-face time during this encounter.   Alycia Rossetti, NP

## 2023-01-08 NOTE — Patient Instructions (Signed)
Immue support reviewed  Take tylenol PRN temp 101+ Push hydration Regular ambulation or calf exercises exercises for clot prevention and 81 mg ASA unless contraindicated Sx supportive therapy suggested Follow up via mychart or telephone if needed Advised patient obtain O2 monitor; present to ED if persistently <90% or with severe dyspnea, CP, fever uncontrolled by tylenol, confusion, sudden decline Should remain in isolation 5 days from testing positive and then wear a mask when around other people for the following 5 days

## 2023-01-12 ENCOUNTER — Ambulatory Visit
Admission: RE | Admit: 2023-01-12 | Discharge: 2023-01-12 | Disposition: A | Discharge status: Home / Self Care | Payer: Medicare Other | Source: Ambulatory Visit | Attending: Internal Medicine | Admitting: Internal Medicine

## 2023-01-12 DIAGNOSIS — Z1231 Encounter for screening mammogram for malignant neoplasm of breast: Secondary | ICD-10-CM

## 2023-01-13 ENCOUNTER — Other Ambulatory Visit: Payer: Self-pay | Admitting: Nurse Practitioner

## 2023-01-13 DIAGNOSIS — N6310 Unspecified lump in the right breast, unspecified quadrant: Secondary | ICD-10-CM

## 2023-01-13 DIAGNOSIS — N6313 Unspecified lump in the right breast, lower outer quadrant: Secondary | ICD-10-CM

## 2023-01-15 ENCOUNTER — Encounter: Payer: Self-pay | Admitting: Nurse Practitioner

## 2023-01-25 ENCOUNTER — Ambulatory Visit
Admission: RE | Admit: 2023-01-25 | Discharge: 2023-01-25 | Disposition: A | Discharge status: Home / Self Care | Payer: Medicare Other | Source: Ambulatory Visit | Attending: Nurse Practitioner | Admitting: Nurse Practitioner

## 2023-01-25 ENCOUNTER — Other Ambulatory Visit: Payer: Self-pay | Admitting: Nurse Practitioner

## 2023-01-25 DIAGNOSIS — N6310 Unspecified lump in the right breast, unspecified quadrant: Secondary | ICD-10-CM

## 2023-01-25 DIAGNOSIS — N6313 Unspecified lump in the right breast, lower outer quadrant: Secondary | ICD-10-CM

## 2023-01-25 DIAGNOSIS — Z853 Personal history of malignant neoplasm of breast: Secondary | ICD-10-CM | POA: Diagnosis not present

## 2023-01-26 NOTE — Progress Notes (Signed)
PATIENT IS AWARE OF MAMMOGRAM RESULTS. Shawna [name] [facility]

## 2023-03-14 ENCOUNTER — Encounter: Payer: Self-pay | Admitting: Internal Medicine

## 2023-03-14 NOTE — Progress Notes (Unsigned)
Annual Screening/Preventative Visit & Comprehensive Evaluation &  Examination   Future Appointments  Date Time Provider Department  03/15/2023                      cpe 10:00 AM Lucky Cowboy, MD [facility]  11/26/2023                 wellness 11:00 AM Adela Glimpse, NP [facility]  03/17/2024                     cpe 10:00 AM Lucky Cowboy, MD [facility]        This very nice 72 y.o.  WWF  (widowed 07/22/2020) presents for a Screening /Preventative Visit & comprehensive evaluation and management of multiple medical co-morbidities.  Patient has been followed for HTN, HLD, Prediabetes  and Vitamin D Deficiency.  Patient has hx/o Lt Breast Lumpectomy in 2006.          HTN predates since 2010. Patient's BP has been controlled at home and patient denies any cardiac symptoms as chest pain, palpitations, shortness of breath, dizziness or ankle swelling. Today's BP is at goal -  130/70 .        Patient's hyperlipidemia is controlled with diet and Rosuvastatin. Patient denies myalgias or other medication SE's. Last lipids were at goal :  Lab Results  Component Value Date   CHOL 138 11/25/2022   HDL 56 11/25/2022   LDLCALC 67 11/25/2022   TRIG 73 11/25/2022   CHOLHDL 2.5 11/25/2022         Patient has hx/o Morbid Obesity  (BMI 33+) & history of PreDiabetes (A1c 5.8% /2010 and 6.1% /2011 ) and patient denies reactive hypoglycemic symptoms, visual blurring, diabetic polys or paresthesias. Last A1c was not at goal :  Lab Results  Component Value Date   HGBA1C 6.1 (H) 11/25/2022         Finally, patient has history of Vitamin D Deficiency ("36" /2008) and last Vitamin D was at goal :  Lab Results  Component Value Date   VD25OH 84 11/25/2022       Current Outpatient Medications on File Prior to Visit  Medication Sig   aspirin 81 MG tablet Take daily.   bisoprolol-hctz 5-6.25 MG tablet Take  1 tablet  every Morning    VITAMIN D  Takes 10,000 u daily   Magnesium 250 MG  TABS Take daily.   Multiple Vitamin  Take 1 tablet daily.   phentermine 37.5 MG tablet Take 1/2 to 1 tablet every Morning    rosuvastatin  20 MG tablet Take 1 tablet Daily      Allergies  Allergen Reactions   Nitrofurantoin Monohyd Macro Nausea Only     Past Medical History:  Diagnosis Date   Cancer (HCC)    Breast   DCIS (ductal carcinoma in situ) of breast    left   Hypercholesteremia    Osteopenia 04/2018   T score -1.7.  Overall stable from prior DEXA   Serous cyst of ovary    left     Health Maintenance  Topic Date Due   Zoster Vaccines- Shingrix (1 of 2) Never done   COVID-19 Vaccine (4 - Booster for Pfizer series) 12/31/2020   TETANUS/TDAP  11/13/2022 (Originally 09/14/2019)   MAMMOGRAM  12/24/2023   Pneumonia Vaccine 8+ Years old  Completed   INFLUENZA VACCINE  Completed   DEXA SCAN  Completed   Hepatitis C Screening  Completed   HPV VACCINES  Aged Out   PAP SMEAR-Modifier  Discontinued     Immunization History  Administered Date(s) Administered   DTaP 06/26/1999   Influenza Split 09/05/2014, 10/04/2015   Influenza Whole 09/01/2013   Influenza, High Dose 09/11/2019, 09/12/2020, 09/22/2021   Influenza 09/09/2016   PFIZER Cov-19 Vacc 11/05/2020   PFIZERSARS-COV-2 Vacc 03/04/2020, 03/26/2020   PPD Test 03/05/2014, 03/11/2015, 03/30/2016   Pneumococcal - 13 04/22/2017   Pneumococcal - 23 02/04/2006, 09/01/2018   Tdap 09/13/2009   Zoster, Live 10/04/2015     Last Colon - 06/17/2018 - Dr Neomia GlassJL Edwards -  Recc 5 year f/u due July 2024   Last MGM - 01/25/2023    Past Surgical History:  Procedure Laterality Date   BREAST LUMPECTOMY Left 2006   COLONOSCOPY N/A 05/24/2013   Dr Shela CommonsJ Karis JubaL Edwards   SALPING OOPHORECTOMY , left  1978     Family History  Problem Relation Age of Onset   Hypertension Mother    Colon cancer Father    Diabetes Sister    Heart disease Sister    Cancer Sister        ? Type   Breast cancer Paternal Grandmother        Age  72's     Social History   Tobacco Use   Smoking status: Never   Smokeless tobacco: Never  Vaping Use   Vaping Use: Never used  Substance Use Topics   Alcohol use: No    Alcohol/week: 0.0 standard drinks   Drug use: No      ROS Constitutional: Denies fever, chills, weight loss/gain, headaches, insomnia,  night sweats, and change in appetite. Does c/o fatigue. Eyes: Denies redness, blurred vision, diplopia, discharge, itchy, watery eyes.  ENT: Denies discharge, congestion, post nasal drip, epistaxis, sore throat, earache, hearing loss, dental pain, Tinnitus, Vertigo, Sinus pain, snoring.  Cardio: Denies chest pain, palpitations, irregular heartbeat, syncope, dyspnea, diaphoresis, orthopnea, PND, claudication, edema Respiratory: denies cough, dyspnea, DOE, pleurisy, hoarseness, laryngitis, wheezing.  Gastrointestinal: Denies dysphagia, heartburn, reflux, water brash, pain, cramps, nausea, vomiting, bloating, diarrhea, constipation, hematemesis, melena, hematochezia, jaundice, hemorrhoids Genitourinary: Denies dysuria, frequency, urgency, nocturia, hesitancy, discharge, hematuria, flank pain Breast: Breast lumps, nipple discharge, bleeding.  Musculoskeletal: Denies arthralgia, myalgia, stiffness, Jt. Swelling, pain, limp, and strain/sprain. Denies falls. Skin: Denies puritis, rash, hives, warts, acne, eczema, changing in skin lesion Neuro: No weakness, tremor, incoordination, spasms, paresthesia, pain Psychiatric: Denies confusion, memory loss, sensory loss. Denies Depression. Endocrine: Denies change in weight, skin, hair change, nocturia, and paresthesia, diabetic polys, visual blurring, hyper / hypo glycemic episodes.  Heme/Lymph: No excessive bleeding, bruising, enlarged lymph nodes.  Physical Exam  BP 130/70   Pulse 65   Temp 97.9 F (36.6 C)   Resp 16   Ht 5' 2.5" (1.588 m)   Wt 188 lb 9.6 oz (85.5 kg)   SpO2 98%   BMI 33.95 kg/m   General Appearance: Over nourished  and in no apparent distress.  Eyes: PERRLA, EOMs, conjunctiva no swelling or erythema, normal fundi and vessels. Sinuses: No frontal/maxillary tenderness ENT/Mouth: EACs patent / TMs  nl. Nares clear without erythema, swelling, mucoid exudates. Oral hygiene is good. No erythema, swelling, or exudate. Tongue normal, non-obstructing. Tonsils not swollen or erythematous. Hearing normal.  Neck: Supple, thyroid not palpable. No bruits, nodes or JVD. Respiratory: Respiratory effort normal.  BS equal and clear bilateral without rales, rhonci, wheezing or stridor. Cardio: Heart sounds are normal with regular rate and rhythm  and no murmurs, rubs or gallops. Peripheral pulses are normal and equal bilaterally without edema. No aortic or femoral bruits. Chest: symmetric with normal excursions and percussion. Breasts: Symmetric, without lumps, nipple discharge, retractions, or fibrocystic changes.  Abdomen: Flat, soft with bowel sounds active. Nontender, no guarding, rebound, hernias, masses, or organomegaly.  Lymphatics: Non tender without lymphadenopathy.  Genitourinary:  Musculoskeletal: Full ROM all peripheral extremities, joint stability, 5/5 strength, and normal gait. Skin: Warm and dry without rashes, lesions, cyanosis, clubbing or  ecchymosis.  Neuro: Cranial nerves intact, reflexes equal bilaterally. Normal muscle tone, no cerebellar symptoms. Sensation intact.  Pysch: Alert and oriented X 3, normal affect, Insight and Judgment appropriate.    Assessment and Plan  1. Essential hypertension  - EKG 12-Lead - Urinalysis, Routine w reflex microscopic - Microalbumin / creatinine urine ratio - CBC with Differential/Platelet - COMPLETE METABOLIC PANEL WITH GFR - Magnesium - TSH  2. Hyperlipidemia, mixed  - EKG 12-Lead - Lipid panel - TSH  3. Abnormal glucose (prediabetes)  - EKG 12-Lead - Hemoglobin A1c - Insulin, random  4. Vitamin D deficiency  - VITAMIN D 25 Hydroxy  5.  Screening for colorectal cancer  - POC Hemoccult Bld/Stl   6. Class 2 severe obesity with body mass index (BMI)  of 35 to 39.9 with serious comorbidity  - TSH  7. Screening for heart disease  - EKG 12-Lead  8. FHx: heart disease  - EKG 12-Lead  9. Medication management  - Urinalysis, Routine w reflex microscopic - Microalbumin / creatinine urine ratio - CBC with Differential/Platelet - COMPLETE METABOLIC PANEL WITH GFR - Magnesium - Lipid panel - TSH - Hemoglobin A1c - Insulin, random - VITAMIN D 25 Hydroxy        Patient was counseled in prudent diet to achieve/maintain BMI less than 25 for weight control, BP monitoring, regular exercise and medications. Discussed med's effects and SE's. Screening labs and tests as requested with regular follow-up as recommended. Over 40 minutes of exam, counseling, chart review and high complex critical decision making was performed.   Marinus Maw, MD

## 2023-03-14 NOTE — Patient Instructions (Signed)

## 2023-03-15 ENCOUNTER — Encounter: Payer: Self-pay | Admitting: Internal Medicine

## 2023-03-15 ENCOUNTER — Ambulatory Visit (INDEPENDENT_AMBULATORY_CARE_PROVIDER_SITE_OTHER): Payer: Medicare Other | Admitting: Internal Medicine

## 2023-03-15 VITALS — BP 130/70 | HR 65 | Temp 97.9°F | Resp 16 | Ht 62.5 in | Wt 188.6 lb

## 2023-03-15 DIAGNOSIS — Z136 Encounter for screening for cardiovascular disorders: Secondary | ICD-10-CM | POA: Diagnosis not present

## 2023-03-15 DIAGNOSIS — E782 Mixed hyperlipidemia: Secondary | ICD-10-CM

## 2023-03-15 DIAGNOSIS — R7309 Other abnormal glucose: Secondary | ICD-10-CM | POA: Diagnosis not present

## 2023-03-15 DIAGNOSIS — Z79899 Other long term (current) drug therapy: Secondary | ICD-10-CM

## 2023-03-15 DIAGNOSIS — E559 Vitamin D deficiency, unspecified: Secondary | ICD-10-CM | POA: Diagnosis not present

## 2023-03-15 DIAGNOSIS — Z8249 Family history of ischemic heart disease and other diseases of the circulatory system: Secondary | ICD-10-CM

## 2023-03-15 DIAGNOSIS — Z1211 Encounter for screening for malignant neoplasm of colon: Secondary | ICD-10-CM

## 2023-03-15 DIAGNOSIS — I10 Essential (primary) hypertension: Secondary | ICD-10-CM | POA: Diagnosis not present

## 2023-03-16 LAB — CBC WITH DIFFERENTIAL/PLATELET
Absolute Monocytes: 508 cells/uL (ref 200–950)
Basophils Absolute: 40 cells/uL (ref 0–200)
Basophils Relative: 0.6 %
Eosinophils Absolute: 79 cells/uL (ref 15–500)
Eosinophils Relative: 1.2 %
HCT: 41.3 % (ref 35.0–45.0)
Hemoglobin: 14 g/dL (ref 11.7–15.5)
Lymphs Abs: 1690 cells/uL (ref 850–3900)
MCH: 30.8 pg (ref 27.0–33.0)
MCHC: 33.9 g/dL (ref 32.0–36.0)
MCV: 91 fL (ref 80.0–100.0)
MPV: 10.6 fL (ref 7.5–12.5)
Monocytes Relative: 7.7 %
Neutro Abs: 4283 cells/uL (ref 1500–7800)
Neutrophils Relative %: 64.9 %
Platelets: 208 10*3/uL (ref 140–400)
RBC: 4.54 10*6/uL (ref 3.80–5.10)
RDW: 13.4 % (ref 11.0–15.0)
Total Lymphocyte: 25.6 %
WBC: 6.6 10*3/uL (ref 3.8–10.8)

## 2023-03-16 LAB — COMPLETE METABOLIC PANEL WITH GFR
AG Ratio: 2 (calc) (ref 1.0–2.5)
ALT: 17 U/L (ref 6–29)
AST: 22 U/L (ref 10–35)
Albumin: 4.4 g/dL (ref 3.6–5.1)
Alkaline phosphatase (APISO): 70 U/L (ref 37–153)
BUN: 17 mg/dL (ref 7–25)
CO2: 28 mmol/L (ref 20–32)
Calcium: 9.9 mg/dL (ref 8.6–10.4)
Chloride: 102 mmol/L (ref 98–110)
Creat: 0.96 mg/dL (ref 0.60–1.00)
Globulin: 2.2 g/dL (calc) (ref 1.9–3.7)
Glucose, Bld: 99 mg/dL (ref 65–99)
Potassium: 4.7 mmol/L (ref 3.5–5.3)
Sodium: 140 mmol/L (ref 135–146)
Total Bilirubin: 0.6 mg/dL (ref 0.2–1.2)
Total Protein: 6.6 g/dL (ref 6.1–8.1)
eGFR: 63 mL/min/{1.73_m2} (ref >=60)

## 2023-03-16 LAB — LIPID PANEL
Cholesterol: 135 mg/dL (ref <200)
HDL: 56 mg/dL (ref >=50)
LDL Cholesterol (Calc): 59 mg/dL (calc)
Non-HDL Cholesterol (Calc): 79 mg/dL (calc) (ref <130)
Total CHOL/HDL Ratio: 2.4 (calc) (ref <5.0)
Triglycerides: 110 mg/dL (ref <150)

## 2023-03-16 LAB — URINALYSIS, ROUTINE W REFLEX MICROSCOPIC
Bacteria, UA: NONE SEEN /HPF
Bilirubin Urine: NEGATIVE
Glucose, UA: NEGATIVE
Hgb urine dipstick: NEGATIVE
Hyaline Cast: NONE SEEN /LPF
Ketones, ur: NEGATIVE
Nitrite: NEGATIVE
Protein, ur: NEGATIVE
Specific Gravity, Urine: 1.012 (ref 1.001–1.035)
Squamous Epithelial / HPF: NONE SEEN /HPF (ref <=5)
pH: 5.5 (ref 5.0–8.0)

## 2023-03-16 LAB — HEMOGLOBIN A1C
Hgb A1c MFr Bld: 6.2 % of total Hgb — ABNORMAL HIGH (ref <5.7)
Mean Plasma Glucose: 131 mg/dL
eAG (mmol/L): 7.3 mmol/L

## 2023-03-16 LAB — MICROALBUMIN / CREATININE URINE RATIO
Creatinine, Urine: 93 mg/dL (ref 20–275)
Microalb Creat Ratio: 2 mg/g creat (ref <30)
Microalb, Ur: 0.2 mg/dL

## 2023-03-16 LAB — INSULIN, RANDOM: Insulin: 19.7 u[IU]/mL — ABNORMAL HIGH

## 2023-03-16 LAB — MAGNESIUM: Magnesium: 1.9 mg/dL (ref 1.5–2.5)

## 2023-03-16 LAB — TSH: TSH: 2.02 mIU/L (ref 0.40–4.50)

## 2023-03-16 LAB — VITAMIN D 25 HYDROXY (VIT D DEFICIENCY, FRACTURES): Vit D, 25-Hydroxy: 96 ng/mL (ref 30–100)

## 2023-03-16 NOTE — Progress Notes (Signed)
^<^<^<^<^<^<^<^<^<^<^<^<^<^<^<^<^<^<^<^<^<^<^<^<^<^<^<^<^<^<^<^<^<^<^<^<^ ^>^>^>^>^>^>^>^>^>^>^>^>^>^>^>^>^>^>^>^>^>^>^>^>^>^>^>^>^>^>^>^>^>^>^>^>^ -   Test results slightly outside the reference range are not unusual. If there is anything important, I will review this with you,  otherwise it is considered normal test values.  If you have further questions,  please do not hesitate to contact me at the office or via My Chart.  ^<^<^<^<^<^<^<^<^<^<^<^<^<^<^<^<^<^<^<^<^<^<^<^<^<^<^<^<^<^<^<^<^<^<^<^<^ ^>^>^>^>^>^>^>^>^>^>^>^>^>^>^>^>^>^>^>^>^>^>^>^>^>^>^>^>^>^>^>^>^>^>^>^>^  -  A1c = 6.2%  - Blood sugar and A1c are elevated in the borderline and                                                           early or pre-diabetes range which has the same   300% increased risk for heart attack, stroke, cancer and                                               alzheimer- type vascular dementia as full blown diabetes.   But the good news is that diet, exercise with weight loss can                                                                                 cure the early diabetes at this point. ^<^<^<^<^<^<^<^<^<^<^<^<^<^<^<^<^<^<^<^<^<^<^<^<^<^<^<^<^<^<^<^<^<^<^<^<^ ^>^>^>^>^>^>^>^>^>^>^>^>^>^>^>^>^>^>^>^>^>^>^>^>^>^>^>^>^>^>^>^>^>^>^>^>^  -  Vitamin D  = 96 - Excellent  !      Please keep dose same   ^<^<^<^<^<^<^<^<^<^<^<^<^<^<^<^<^<^<^<^<^<^<^<^<^<^<^<^<^<^<^<^<^<^<^<^<^ ^>^>^>^>^>^>^>^>^>^>^>^>^>^>^>^>^>^>^>^>^>^>^>^>^>^>^>^>^>^>^>^>^>^>^>^>^  -  Chol = 135   &  LDL = 59   - Both  Excellent   - Very low risk for Heart Attack  / Stroke  ^<^<^<^<^<^<^<^<^<^<^<^<^<^<^<^<^<^<^<^<^<^<^<^<^<^<^<^<^<^<^<^<^<^<^<^<^ ^>^>^>^>^>^>^>^>^>^>^>^>^>^>^>^>^>^>^>^>^>^>^>^>^>^>^>^>^>^>^>^>^>^>^>^>^  -  All Else - CBC - Kidneys - Electrolytes - Liver - Magnesium & Thyroid    - all  Normal /  OK  ^<^<^<^<^<^<^<^<^<^<^<^<^<^<^<^<^<^<^<^<^<^<^<^<^<^<^<^<^<^<^<^<^<^<^<^<^ ^>^>^>^>^>^>^>^>^>^>^>^>^>^>^>^>^>^>^>^>^>^>^>^>^>^>^>^>^>^>^>^>^>^>^>^>^

## 2023-03-17 ENCOUNTER — Other Ambulatory Visit: Payer: Self-pay | Admitting: Internal Medicine

## 2023-03-17 MED ORDER — PHENTERMINE HCL 37.5 MG PO TABS: ORAL_TABLET | ORAL | 0 refills | Status: DC

## 2023-03-24 NOTE — Progress Notes (Signed)
Patient is aware of lab results and instructions. -e welch

## 2023-04-03 IMAGING — MG MM DIGITAL SCREENING BILAT W/ TOMO AND CAD
8 series · 8 of 24 positions shown · non-contrast
Comparison: Previous exam(s).

CLINICAL DATA: Screening.

EXAM:
DIGITAL SCREENING BILATERAL MAMMOGRAM WITH TOMOSYNTHESIS AND CAD
TECHNIQUE: Bilateral screening digital craniocaudal and mediolateral oblique
mammograms were obtained. Bilateral screening digital breast
tomosynthesis was performed. The images were evaluated with
computer-aided detection.

[R CC synth-2D]
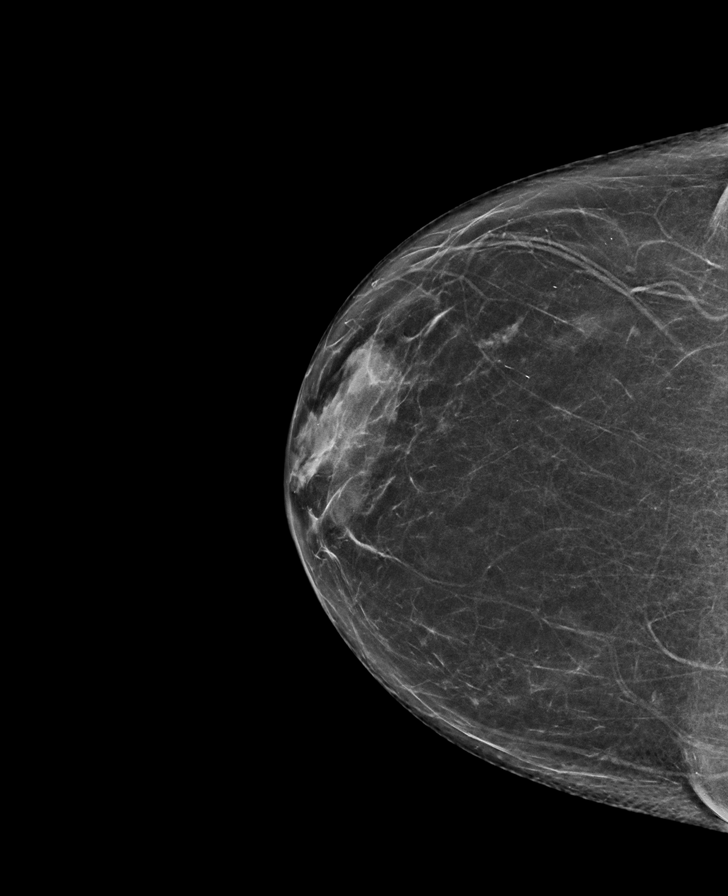

[L MLO synth-2D]
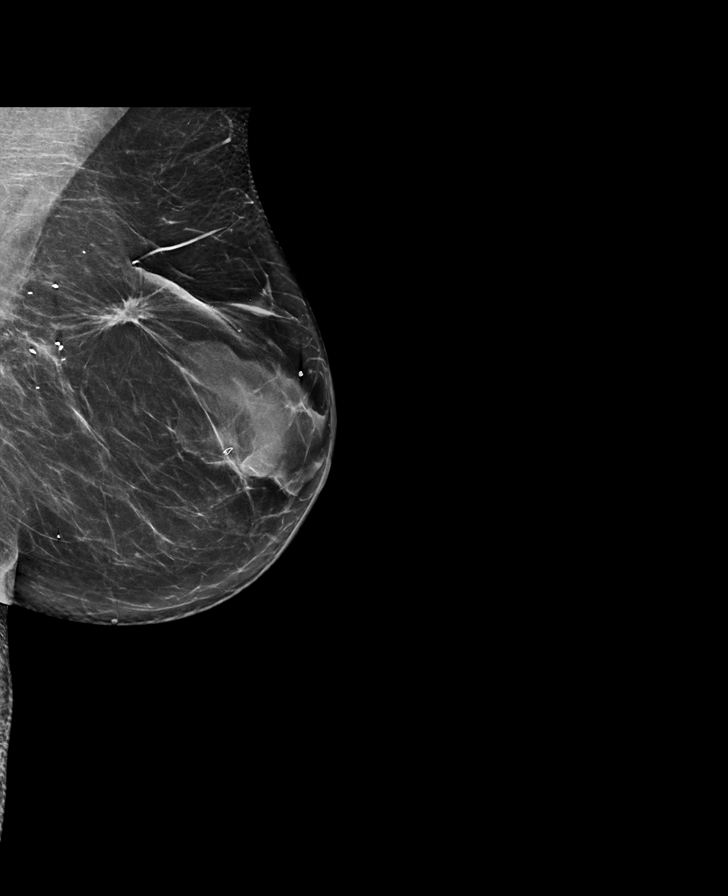

[R MLO synth-2D]
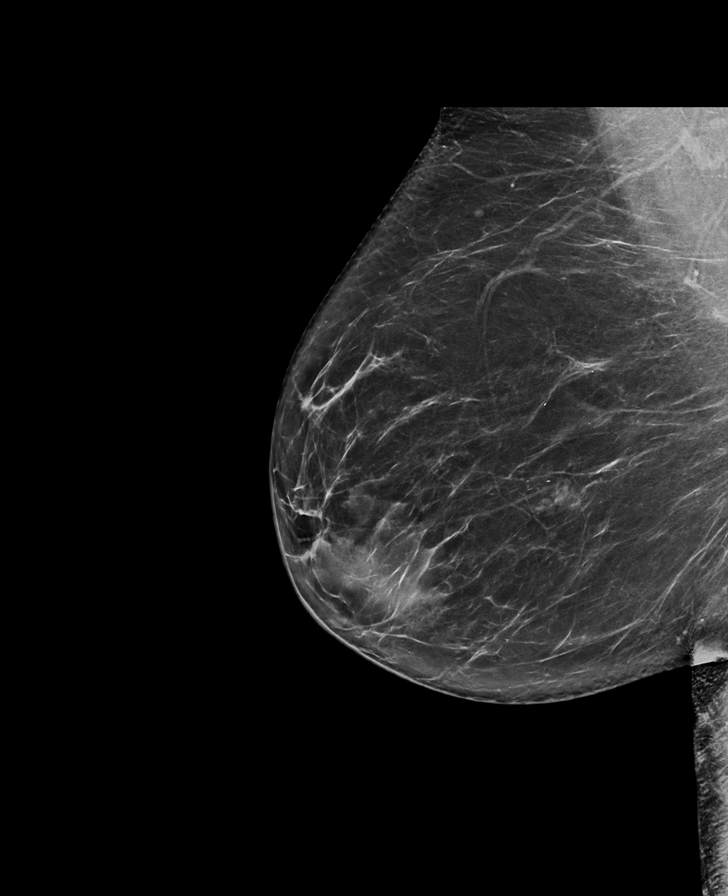

[L CC synth-2D]
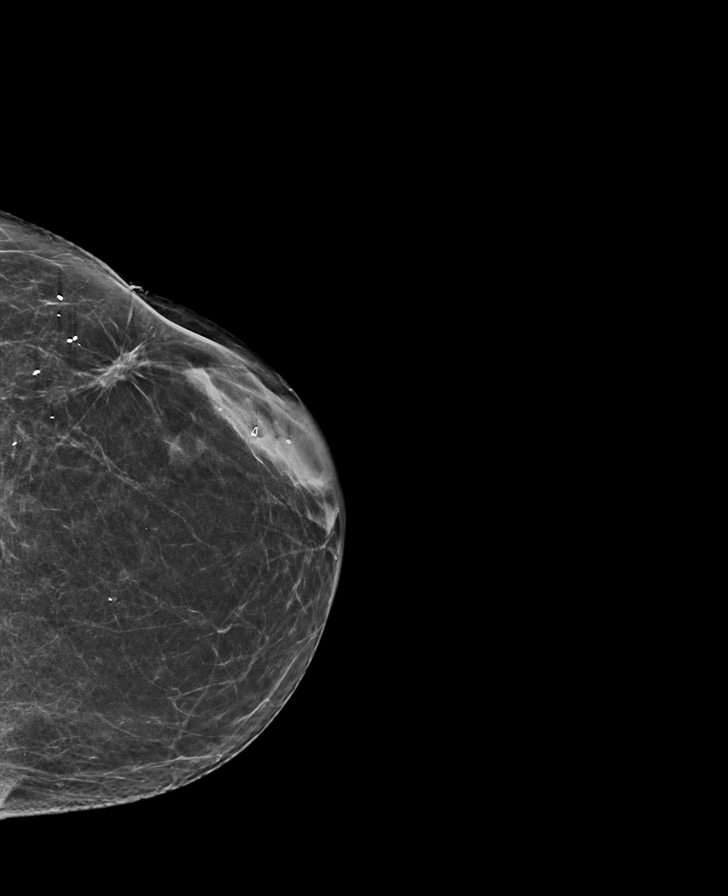

[L MLO tomo · tomo slice 39/78.0]
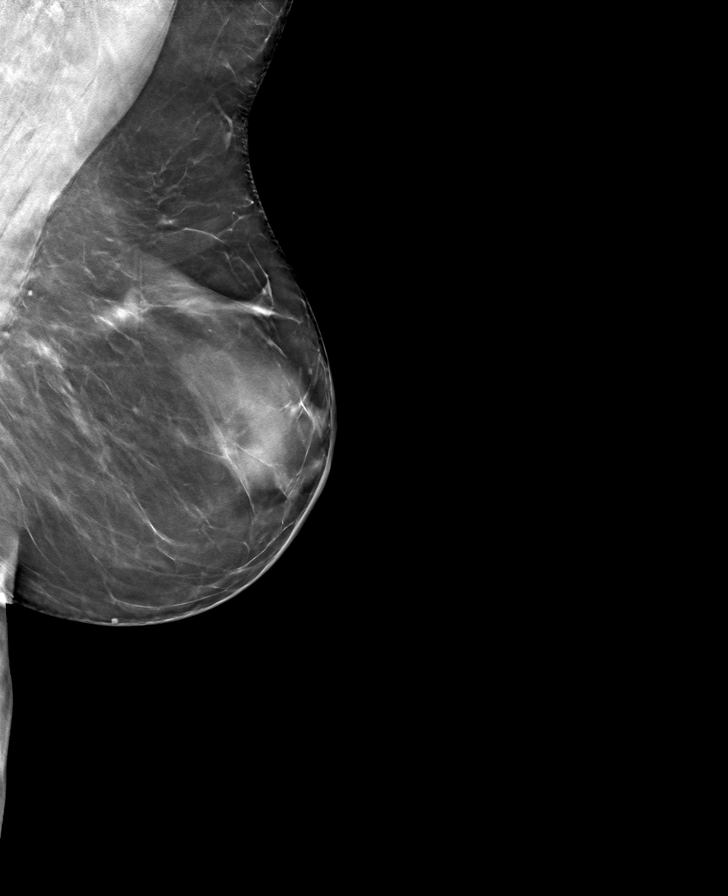

[R CC tomo · tomo slice 35/68.0]
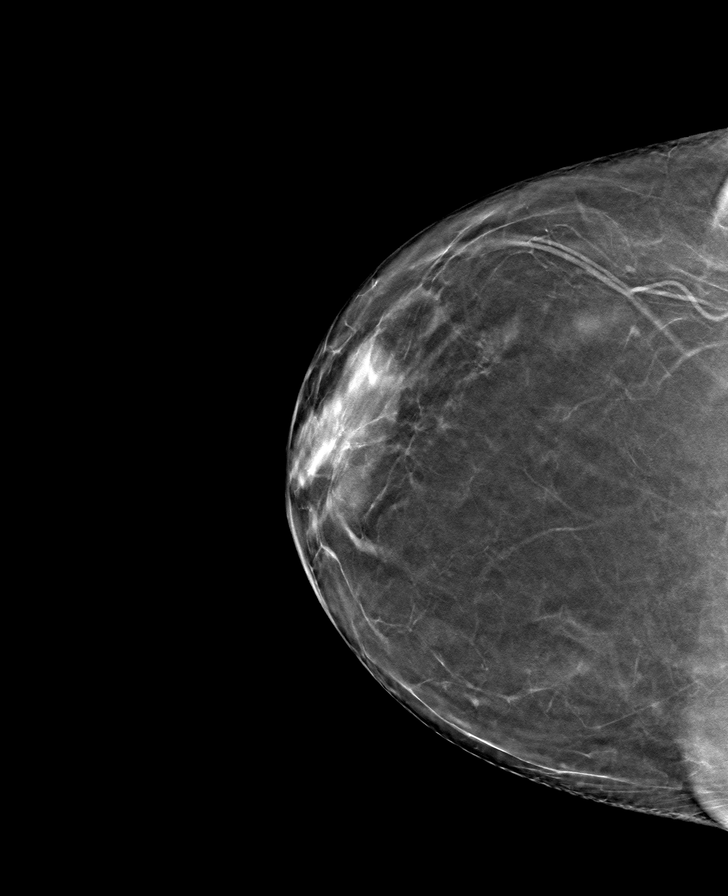

[R MLO tomo · tomo slice 40/79.0]
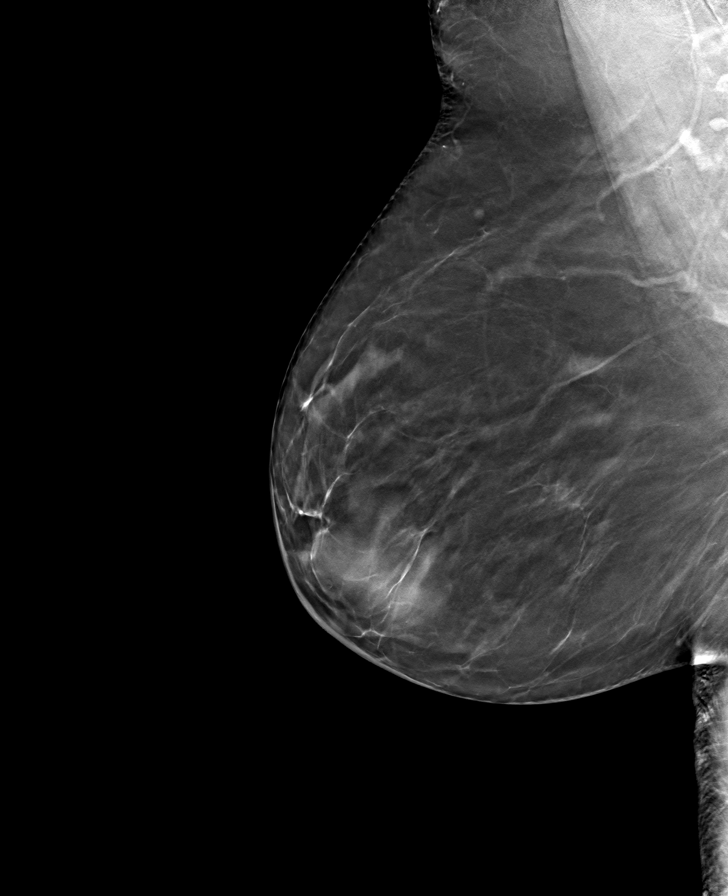

[L CC tomo · tomo slice 31/62.0]
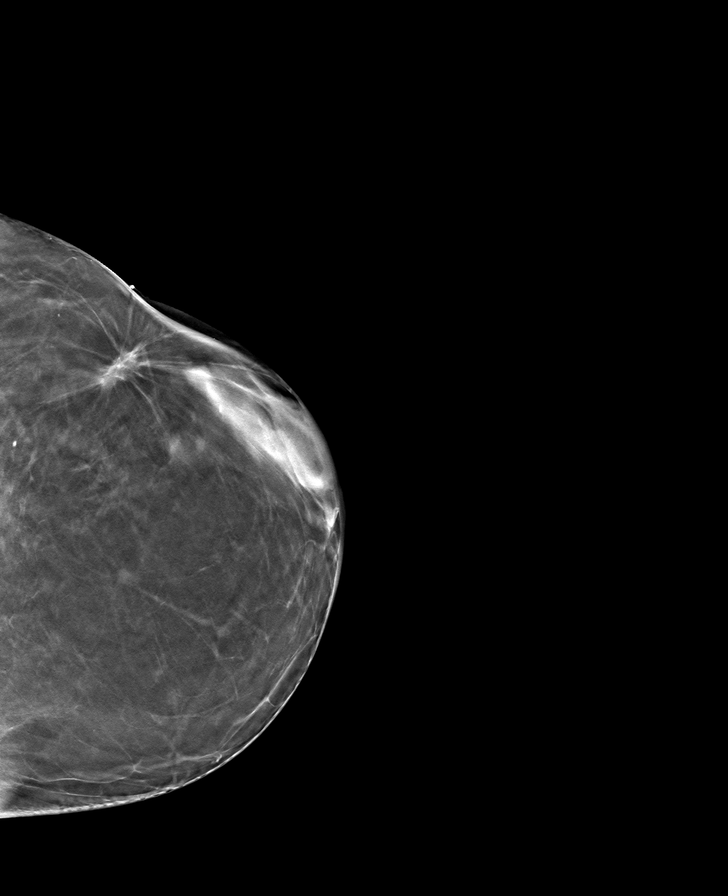

[8 of 24 positions shown; findings below may reference images not displayed]

ACR Breast Density Category b: There are scattered areas of
fibroglandular density.
FINDINGS: There are no findings suspicious for malignancy.
IMPRESSION: No mammographic evidence of malignancy. A result letter of this
screening mammogram will be mailed directly to the patient.

RECOMMENDATION:
Screening mammogram in one year. (Code:51-O-LD2)

BI-RADS CATEGORY  1: Negative.

## 2023-04-15 ENCOUNTER — Ambulatory Visit (INDEPENDENT_AMBULATORY_CARE_PROVIDER_SITE_OTHER): Payer: Medicare Other | Admitting: Nurse Practitioner

## 2023-04-15 ENCOUNTER — Encounter: Payer: Self-pay | Admitting: Nurse Practitioner

## 2023-04-15 VITALS — BP 128/70 | HR 70 | Temp 97.3°F | Ht 62.5 in | Wt 191.4 lb

## 2023-04-15 DIAGNOSIS — I10 Essential (primary) hypertension: Secondary | ICD-10-CM

## 2023-04-15 DIAGNOSIS — H7291 Unspecified perforation of tympanic membrane, right ear: Secondary | ICD-10-CM

## 2023-04-15 MED ORDER — DEXAMETHASONE 2 MG PO TABS: ORAL_TABLET | ORAL | 0 refills | Status: DC

## 2023-04-15 MED ORDER — CIPROFLOXACIN HCL 500 MG PO TABS: 500.0000 mg | ORAL_TABLET | Freq: Two times a day (BID) | ORAL | 0 refills | Status: DC

## 2023-04-15 NOTE — Progress Notes (Signed)
Assessment and Plan:  Shawna [name] was seen today for follow-up.  Diagnoses and all orders for this visit:  Perforated eardrum, right Appears to be healing well- no fresh blood Return in 1 month and will do ear lavage to remove dried blood  Essential hypertension - continue medications, DASH diet, exercise and monitor at home. Call if greater than 130/80.        Further disposition pending results of labs. Discussed med's effects and SE's.   Over 30 minutes of exam, counseling, chart review, and critical decision making was performed.   Future Appointments  Date Time Provider Department Center  06/16/2023 10:30 AM Adela Glimpse, NP [facility] None  09/20/2023 10:30 AM Lucky Cowboy, MD [facility] None  12/27/2023  2:30 PM Adela Glimpse, NP [facility] None  04/03/2024 10:00 AM Lucky Cowboy, MD [facility] None    ------------------------------------------------------------------------------------------------------------------   HPI BP 122/78   Pulse (!) 53   Temp (!) 97.3 F (36.3 C)   Ht 5' 2.5" (1.588 m)   Wt 192 lb 9.6 oz (87.4 kg)   SpO2 98%   BMI 34.67 kg/m    72 y.o.female presents for reevaluation of perforated eardrum. She has completed her course pf medication and notes pain is improved and hearing is better    She is currently on Ziac 5/6.25 mg daily and is having no complications BP Readings from Last 3 Encounters:  04/19/23 122/78  04/15/23 128/70  03/15/23 130/70     Past Medical History:  Diagnosis Date   Cancer (HCC)    Breast   DCIS (ductal carcinoma in situ) of breast    left   Hypercholesteremia    Osteopenia 04/2018   T score -1.7.  Overall stable from prior DEXA   Serous cyst of ovary    left     Allergies  Allergen Reactions   Nitrofurantoin Monohyd Macro Nausea Only    Current Outpatient Medications on File Prior to Visit  Medication Sig   Ascorbic Acid (VITAMIN C) 1000 MG tablet Take 1,000 mg by mouth daily.   aspirin 81  MG tablet Take 81 mg by mouth daily.   bisoprolol-hydrochlorothiazide (ZIAC) 5-6.25 MG tablet Take  1 tablet  every Morning  for BP   Cholecalciferol (VITAMIN D PO) Take 2,000 Units by mouth. Takes 16109 iu daily   ciprofloxacin (CIPRO) 500 MG tablet Take 1 tablet (500 mg total) by mouth 2 (two) times daily for 4 days.   dexamethasone (DECADRON) 2 MG tablet Take 2 tabs for 3 days,1 tabs for 3 days  Take with food.   Magnesium 250 MG TABS Take by mouth daily.   Multiple Vitamin (MULTIVITAMIN) tablet Take 1 tablet by mouth daily.   phentermine (ADIPEX-P) 37.5 MG tablet 1/2 TO 1 TABLET EVERY MORNING FOR DIETING AND WEIGHT LOSS   rosuvastatin (CRESTOR) 20 MG tablet Take 1 tablet Daily for Cholesterol   Zinc 50 MG TABS Take by mouth.   No current facility-administered medications on file prior to visit.    ROS: all negative except above.   Physical Exam:  BP 122/78   Pulse (!) 53   Temp (!) 97.3 F (36.3 C)   Ht 5' 2.5" (1.588 m)   Wt 192 lb 9.6 oz (87.4 kg)   SpO2 98%   BMI 34.67 kg/m   General Appearance: Well nourished, in no apparent distress. Eyes: PERRLA, EOMs, conjunctiva no swelling or erythema Sinuses: No Frontal/maxillary tenderness ENT/Mouth: Ext aud canals clear, L TM without erythema, bulging.R TM dried blood  in canal blocking TM, no erythema No erythema, swelling, or exudate on post pharynx.  Hearing normal.  Neck: Supple, thyroid normal.  Respiratory: Respiratory effort normal, BS equal bilaterally without rales, rhonchi, wheezing or stridor.  Cardio: RRR with no MRGs. Brisk peripheral pulses without edema.  Lymphatics: Non tender without lymphadenopathy.  Musculoskeletal: Full ROM, 5/5 strength, normal gait.  Skin: Warm, dry without rashes, lesions, ecchymosis.  Neuro: Cranial nerves intact. Normal muscle tone, no cerebellar symptoms. Sensation intact.  Psych: Awake and oriented X 3, normal affect, Insight and Judgment appropriate.     Raynelle Dick, NP 10:47  AM [name]

## 2023-04-15 NOTE — Progress Notes (Signed)
Assessment and Plan: Terika was seen today for otalgia.  Diagnoses and all orders for this visit:  Essential hypertension - continue medications, DASH diet, exercise and monitor at home. Call if greater than 130/80.   Perforated eardrum, right Use Ciprofloxacin(antibiotic) twice a day for 4 days Use dexamethasone(steroid) 2 tabs for 3 days and 1 tab for 3 days Keep the ear dry, can use cotton ball in ear Follow up in 4-5 daysTake with food. -     ciprofloxacin (CIPRO) 500 MG tablet; Take 1 tablet (500 mg total) by mouth 2 (two) times daily for 4 days. -     dexamethasone (DECADRON) 2 MG tablet; Take 2 tabs for 3 days,1 tabs for 3 days        Further disposition pending results of labs. Discussed med's effects and SE's.   Over 30 minutes of exam, counseling, chart review, and critical decision making was performed.   Future Appointments  Date Time Provider Department Center  06/16/2023 10:30 AM Adela Glimpse, NP [facility] None  09/20/2023 10:30 AM Lucky Cowboy, MD [facility] None  12/27/2023  2:30 PM Adela Glimpse, NP [facility] None  04/03/2024 10:00 AM Lucky Cowboy, MD [facility] None    ------------------------------------------------------------------------------------------------------------------   HPI BP 128/70   Pulse 70   Temp (!) 97.3 F (36.3 C)   Ht 5' 2.5" (1.588 m)   Wt 191 lb 6.4 oz (86.8 kg)   SpO2 94%   BMI 34.45 kg/m   71 y.o.female presents for sharp pain at night and pain resolved the same night. She took a qtip superficially and there was a little blood.  Has had a few quick sharp pains intermittently over the past few days.  Has also had some fullness feeling in her ears. Hearing is not as good since she has been feeling the fullness.  Also some tenderness on right side of neck under her ear   BP is controlled with Ziac 5/6.25 mg QD.  Denies headaches, chest pain, shortness of breath and dizziness BP Readings from Last 3 Encounters:   04/15/23 128/70  03/15/23 130/70  11/25/22 138/80   BMI is Body mass index is 34.45 kg/m., she has  been working on diet and exercise, will occasionally use Phentermine Wt Readings from Last 3 Encounters:  04/15/23 191 lb 6.4 oz (86.8 kg)  03/15/23 188 lb 9.6 oz (85.5 kg)  11/25/22 185 lb (83.9 kg)       Past Medical History:  Diagnosis Date   Cancer (HCC)    Breast   DCIS (ductal carcinoma in situ) of breast    left   Hypercholesteremia    Osteopenia 04/2018   T score -1.7.  Overall stable from prior DEXA   Serous cyst of ovary    left     Allergies  Allergen Reactions   Nitrofurantoin Monohyd Macro Nausea Only    Current Outpatient Medications on File Prior to Visit  Medication Sig   Ascorbic Acid (VITAMIN C) 1000 MG tablet Take 1,000 mg by mouth daily.   aspirin 81 MG tablet Take 81 mg by mouth daily.   bisoprolol-hydrochlorothiazide (ZIAC) 5-6.25 MG tablet Take  1 tablet  every Morning  for BP   Cholecalciferol (VITAMIN D PO) Take 2,000 Units by mouth. Takes 16109 iu daily   Magnesium 250 MG TABS Take by mouth daily.   Multiple Vitamin (MULTIVITAMIN) tablet Take 1 tablet by mouth daily.   phentermine (ADIPEX-P) 37.5 MG tablet 1/2 TO 1 TABLET EVERY MORNING FOR DIETING AND  WEIGHT LOSS   rosuvastatin (CRESTOR) 20 MG tablet Take 1 tablet Daily for Cholesterol   Zinc 50 MG TABS Take by mouth.   No current facility-administered medications on file prior to visit.    ROS: all negative except above.   Physical Exam:  BP 128/70   Pulse 70   Temp (!) 97.3 F (36.3 C)   Ht 5' 2.5" (1.588 m)   Wt 191 lb 6.4 oz (86.8 kg)   SpO2 94%   BMI 34.45 kg/m   General Appearance: Well nourished, in no apparent distress. Eyes: PERRLA, EOMs, conjunctiva no swelling or erythema Sinuses: No Frontal/maxillary tenderness ENT/Mouth: Ext aud canals clear, L TM without erythema, bulging.  R TM blood blocking view of TM. No erythema, swelling, or exudate on post pharynx.   Tonsils not swollen or erythematous. Hearing normal.  Neck: Supple, thyroid normal.  Respiratory: Respiratory effort normal, BS equal bilaterally without rales, rhonchi, wheezing or stridor.  Cardio: RRR with no MRGs. Brisk peripheral pulses without edema.  Abdomen: Soft, + BS.  Non tender, no guarding, rebound, hernias, masses. Lymphatics: Non tender without lymphadenopathy.  Musculoskeletal: Full ROM, 5/5 strength, normal gait.  Skin: Warm, dry without rashes, lesions, ecchymosis.  Neuro: Cranial nerves intact. Normal muscle tone, no cerebellar symptoms. Sensation intact.  Psych: Awake and oriented X 3, normal affect, Insight and Judgment appropriate.     Raynelle Dick, NP 10:11 AM [facility]

## 2023-04-15 NOTE — Patient Instructions (Addendum)
Use Ciprofloxacin(antibiotic) twice a day for 4 days Use dexamethasone(steroid) 2 tabs for 3 days and 1 tab for 3 days  Keep the ear dry, can use cotton ball in ear  Follow up in 4-5 days  Eardrum Rupture  An eardrum rupture is a hole (perforation) in the eardrum. The eardrum is a thin, round tissue inside the ear. It allows you to hear. This condition may cause pain and hearing loss. There is often little or no long-term hearing loss. What are the causes? This condition may be caused by: An infection. An injury from: Putting a thin object into the ear. Getting hit on the side of the head. Falling onto water or a flat surface. Changes in pressure that can happen from flying, scuba diving, or a very loud noise. Inserting a cotton swab in the ear. Long-term ear problems. Surgery on the ear. Getting a tube called a PE tube removed or having it fall out. This is a tube placed during a surgery to help with ear problems. What increases the risk? Having had PE tubes put in your ears. Having an ear infection. Playing sports that: Involve balls or contact with other players. Take place in water, such as diving, scuba diving, or waterskiing. What are the signs or symptoms? Pain. Ringing in the ear. Fluid leaking from the ear. Hearing loss. Dizziness. How is this treated? The eardrum often heals on its own in a few weeks. If your eardrum does not heal, your doctor may recommend surgery to fix the eardrum. You may also need antibiotic medicine to help prevent infection. Follow these instructions at home: Medicines Take over-the-counter and prescription medicines only as told by your doctor. If you were prescribed an antibiotic medicine, use it as told by your doctor. Do not stop using it even if you start to feel better. Caring for your ear Keep your ear dry. Follow instructions from your doctor about how to keep your ear dry. You may need to wear waterproof earplugs when bathing and  swimming. If told, put heat on the affected ear to help with pain. Do this as often as told by your doctor. Use the heat source that your doctor recommends, such as a moist heat pack or a heating pad. Place a towel between your skin and the heat source. Leave the heat on for 20-30 minutes. Take off the heat if your skin turns bright red. This is very important. If you cannot feel pain, heat, or cold, you have a greater risk of getting burned. General instructions Return to your normal activities when your doctor says that it is safe. When you play sports in which ear injuries may happen, wear headgear with ear protection. Talk to your doctor before you fly on an airplane. Keep all follow-up visits. Contact a doctor if: You have a fever. You have ear pain. You have mucus or blood leaking from your ear. You cannot hear. You have ringing in the ear. You feel dizzy. Get help right away if: You have sudden hearing loss. You are very dizzy. You get very bad pain in your ear. You have weakness in your face. You cannot move parts of your face. These symptoms may be an emergency. Do not wait to see if the symptoms will go away. Get help right away. Call your local emergency services (911 in the [place] Summary An eardrum rupture is a tear that makes a hole in the eardrum. The eardrum will likely heal on its own within a few  weeks. Follow instructions from your doctor about how to keep your ear dry and protected as it heals. This information is not intended to replace advice given to you by your health care provider. Make sure you discuss any questions you have with your health care provider. Document Revised: 10/14/2020 Document Reviewed: 10/14/2020 Elsevier Patient Education  2023 [employer].

## 2023-04-19 ENCOUNTER — Encounter: Payer: Self-pay | Admitting: Nurse Practitioner

## 2023-04-19 ENCOUNTER — Ambulatory Visit (INDEPENDENT_AMBULATORY_CARE_PROVIDER_SITE_OTHER): Payer: Medicare Other | Admitting: Nurse Practitioner

## 2023-04-19 VITALS — BP 122/78 | HR 53 | Temp 97.3°F | Ht 62.5 in | Wt 192.6 lb

## 2023-04-19 DIAGNOSIS — I10 Essential (primary) hypertension: Secondary | ICD-10-CM | POA: Diagnosis not present

## 2023-04-19 DIAGNOSIS — H7291 Unspecified perforation of tympanic membrane, right ear: Secondary | ICD-10-CM

## 2023-05-18 NOTE — Progress Notes (Unsigned)
Assessment and Plan:  There are no diagnoses linked to this encounter.    Further disposition pending results of labs. Discussed med's effects and SE's.   Over 30 minutes of exam, counseling, chart review, and critical decision making was performed.   Future Appointments  Date Time Provider Department Center  05/20/2023  9:30 AM Raynelle Dick, NP [facility] None  06/17/2023  2:00 PM Adela Glimpse, NP [facility] None  09/20/2023 10:30 AM Lucky Cowboy, MD [facility] None  12/27/2023  2:30 PM Adela Glimpse, NP [facility] None  04/03/2024 10:00 AM Lucky Cowboy, MD [facility] None    ------------------------------------------------------------------------------------------------------------------   HPI There were no vitals taken for this visit. 72 y.o.female presents for  Past Medical History:  Diagnosis Date   Cancer (HCC)    Breast   DCIS (ductal carcinoma in situ) of breast    left   Hypercholesteremia    Osteopenia 04/2018   T score -1.7.  Overall stable from prior DEXA   Serous cyst of ovary    left     Allergies  Allergen Reactions   Nitrofurantoin Monohyd Macro Nausea Only    Current Outpatient Medications on File Prior to Visit  Medication Sig   Ascorbic Acid (VITAMIN C) 1000 MG tablet Take 1,000 mg by mouth daily.   aspirin 81 MG tablet Take 81 mg by mouth daily.   bisoprolol-hydrochlorothiazide (ZIAC) 5-6.25 MG tablet Take  1 tablet  every Morning  for BP   Cholecalciferol (VITAMIN D PO) Take 2,000 Units by mouth. Takes 16109 iu daily   Magnesium 250 MG TABS Take by mouth daily.   Multiple Vitamin (MULTIVITAMIN) tablet Take 1 tablet by mouth daily.   phentermine (ADIPEX-P) 37.5 MG tablet 1/2 TO 1 TABLET EVERY MORNING FOR DIETING AND WEIGHT LOSS   rosuvastatin (CRESTOR) 20 MG tablet Take 1 tablet Daily for Cholesterol   Zinc 50 MG TABS Take by mouth.   No current facility-administered medications on file prior to visit.    ROS: all negative  except above.   Physical Exam:  There were no vitals taken for this visit.  General Appearance: Well nourished, in no apparent distress. Eyes: PERRLA, EOMs, conjunctiva no swelling or erythema Sinuses: No Frontal/maxillary tenderness ENT/Mouth: Ext aud canals clear, TMs without erythema, bulging. No erythema, swelling, or exudate on post pharynx.  Tonsils not swollen or erythematous. Hearing normal.  Neck: Supple, thyroid normal.  Respiratory: Respiratory effort normal, BS equal bilaterally without rales, rhonchi, wheezing or stridor.  Cardio: RRR with no MRGs. Brisk peripheral pulses without edema.  Abdomen: Soft, + BS.  Non tender, no guarding, rebound, hernias, masses. Lymphatics: Non tender without lymphadenopathy.  Musculoskeletal: Full ROM, 5/5 strength, normal gait.  Skin: Warm, dry without rashes, lesions, ecchymosis.  Neuro: Cranial nerves intact. Normal muscle tone, no cerebellar symptoms. Sensation intact.  Psych: Awake and oriented X 3, normal affect, Insight and Judgment appropriate.     Raynelle Dick, NP 3:29 PM [facility]

## 2023-05-20 ENCOUNTER — Ambulatory Visit (INDEPENDENT_AMBULATORY_CARE_PROVIDER_SITE_OTHER): Payer: Medicare Other | Admitting: Nurse Practitioner

## 2023-05-20 ENCOUNTER — Encounter: Payer: Self-pay | Admitting: Nurse Practitioner

## 2023-05-20 VITALS — BP 122/68 | HR 69 | Temp 97.6°F | Ht 62.5 in | Wt 191.0 lb

## 2023-05-20 DIAGNOSIS — H6121 Impacted cerumen, right ear: Secondary | ICD-10-CM | POA: Diagnosis not present

## 2023-05-20 NOTE — Patient Instructions (Signed)

## 2023-06-16 ENCOUNTER — Ambulatory Visit: Payer: Medicare Other | Admitting: Nurse Practitioner

## 2023-06-17 ENCOUNTER — Encounter: Payer: Self-pay | Admitting: Nurse Practitioner

## 2023-06-17 ENCOUNTER — Ambulatory Visit (INDEPENDENT_AMBULATORY_CARE_PROVIDER_SITE_OTHER): Payer: Medicare Other | Admitting: Nurse Practitioner

## 2023-06-17 VITALS — BP 120/70 | HR 61 | Temp 97.4°F | Ht 62.5 in | Wt 192.4 lb

## 2023-06-17 DIAGNOSIS — I10 Essential (primary) hypertension: Secondary | ICD-10-CM | POA: Diagnosis not present

## 2023-06-17 DIAGNOSIS — E559 Vitamin D deficiency, unspecified: Secondary | ICD-10-CM

## 2023-06-17 DIAGNOSIS — Z79899 Other long term (current) drug therapy: Secondary | ICD-10-CM

## 2023-06-17 DIAGNOSIS — R7309 Other abnormal glucose: Secondary | ICD-10-CM

## 2023-06-17 DIAGNOSIS — Z86000 Personal history of in-situ neoplasm of breast: Secondary | ICD-10-CM

## 2023-06-17 DIAGNOSIS — E782 Mixed hyperlipidemia: Secondary | ICD-10-CM

## 2023-06-17 NOTE — Progress Notes (Signed)
Follow Up  Assessment:   Essential hypertension Discussed DASH (Dietary Approaches to Stop Hypertension) DASH diet is lower in sodium than a typical American diet. Cut back on foods that are high in saturated fat, cholesterol, and trans fats. Eat more whole-grain foods, fish, poultry, and nuts Remain active and exercise as tolerated daily.  Monitor BP at home-Call if greater than 130/80.  Check CMP/CBC   History of ductal carcinoma in situ (DCIS) of breast, left, s/p resection UTD on mammograms Continue to monitor  Hyperlipidemia Discussed lifestyle modifications. Recommended diet heavy in fruits and veggies, omega 3's. Decrease consumption of animal meats, cheeses, and dairy products. Remain active and exercise as tolerated. Continue to monitor. Check lipids/TSH   Prediabetes Education: Reviewed 'ABCs' of diabetes management  Discussed goals to be met and/or maintained include A1C (<7) Blood pressure (<130/80) Cholesterol (LDL <70) Continue Eye Exam yearly  Continue Dental Exam Q6 mo Discussed dietary recommendations Discussed Physical Activity recommendations Check A1C   Vitamin D deficiency Continue supplement Monitor levels  Medication management All medications discussed and reviewed in full. All questions and concerns regarding medications addressed.    Obesity Discussed appropriate BMI Diet modification. Physical activity. Encouraged/praised to build confidence.  Orders Placed This Encounter  Procedures   CBC with Differential/Platelet   COMPLETE METABOLIC PANEL WITH GFR   Hemoglobin A1C w/out eAG   Notify office for further evaluation and treatment, questions or concerns if any reported s/s fail to improve.   The patient was advised to call back or seek an in-person evaluation if any symptoms worsen or if the condition fails to improve as anticipated.   Further disposition pending results of labs. Discussed med's effects and SE's.    I  discussed the assessment and treatment plan with the patient. The patient was provided an opportunity to ask questions and all were answered. The patient agreed with the plan and demonstrated an understanding of the instructions.  Discussed med's effects and SE's. Screening labs and tests as requested with regular follow-up as recommended.  I provided 25 minutes of face-to-face time during this encounter including counseling, chart review, and critical decision making was preformed.  Future Appointments  Date Time Provider Department Center  06/17/2023  2:00 PM Adela Glimpse, NP [facility] None  09/20/2023 10:30 AM Lucky Cowboy, MD [facility] None  12/27/2023  2:30 PM Adela Glimpse, NP [facility] None  04/03/2024 10:00 AM Lucky Cowboy, MD [facility] None    Subjective:  Shawna [name] is a 72 y.o. female who presents for general follow up.     Overall she reports feeling well today.  She has no new or additional concerns.   Patient is s/p lumpectomy of L breast DCIS in 2006. Continues with annual mammograms via breast center.   BMI is Body mass index is 34.63 kg/m., she is working on diet and exercise. Has Phentermine on hand.   Wt Readings from Last 3 Encounters:  06/17/23 192 lb 6.4 oz (87.3 kg)  05/20/23 191 lb (86.6 kg)  04/19/23 192 lb 9.6 oz (87.4 kg)    Her blood pressure has been controlled at home, today their BP is BP: 120/70  She does not workout but has part time job and takes care of her husband. She denies chest pain, shortness of breath, dizziness.   She is on cholesterol medication, Crestor 20 mg QD and denies myalgias. Her cholesterol is at goal. The cholesterol last visit was:   Lab Results  Component Value Date   CHOL 135  03/15/2023   HDL 56 03/15/2023   LDLCALC 59 03/15/2023   TRIG 110 03/15/2023   CHOLHDL 2.4 03/15/2023    She has been working on diet and exercise for prediabetes, and denies paresthesia of the feet, polydipsia, polyuria and  visual disturbances. Last A1C in the office was:  Lab Results  Component Value Date   HGBA1C 6.2 (H) 03/15/2023   Last GFR: Lab Results  Component Value Date   [facility] 74 05/06/2021   Patient is on Vitamin D supplement.   Lab Results  Component Value Date   VD25OH 96 03/15/2023       Medication Review: Current Outpatient Medications on File Prior to Visit  Medication Sig Dispense Refill   Ascorbic Acid (VITAMIN C) 1000 MG tablet Take 1,000 mg by mouth daily.     aspirin 81 MG tablet Take 81 mg by mouth daily.     bisoprolol-hydrochlorothiazide (ZIAC) 5-6.25 MG tablet Take  1 tablet  every Morning  for BP 90 tablet 3   Cholecalciferol (VITAMIN D PO) Take 2,000 Units by mouth. Takes 16109 iu daily     Magnesium 250 MG TABS Take by mouth daily.     Multiple Vitamin (MULTIVITAMIN) tablet Take 1 tablet by mouth daily.     phentermine (ADIPEX-P) 37.5 MG tablet 1/2 TO 1 TABLET EVERY MORNING FOR DIETING AND WEIGHT LOSS 90 tablet 0   rosuvastatin (CRESTOR) 20 MG tablet Take 1 tablet Daily for Cholesterol 90 tablet 3   Zinc 50 MG TABS Take by mouth.     No current facility-administered medications on file prior to visit.    Allergies  Allergen Reactions   Nitrofurantoin Monohyd Macro Nausea Only    Current Problems (verified) Patient Active Problem List   Diagnosis Date Noted   Medication management 09/05/2014   Obesity (BMI 30.0-34.9) 09/05/2014   Essential hypertension 11/24/2013   Hyperlipidemia 11/24/2013   Abnormal glucose (prediabetes) 11/24/2013   Vitamin D deficiency 11/24/2013   DCIS (ductal carcinoma in situ) of breast, Left lumpectomy. 09/23/2005.     Screening Tests Immunization History  Administered Date(s) Administered   DTaP 06/26/1999   Influenza Split 09/05/2014, 10/04/2015   Influenza Whole 09/01/2013   Influenza, High Dose Seasonal PF 08/12/2017, 09/01/2018, 09/11/2019, 09/12/2020, 09/22/2021, 09/23/2022   Influenza-Unspecified 09/09/2016   PFIZER  Comirnaty(Gray Top)Covid-19 Tri-Sucrose Vaccine 11/05/2020   PFIZER(Purple Top)SARS-COV-2 Vaccination 03/04/2020, 03/26/2020   PPD Test 03/05/2014, 03/11/2015, 03/30/2016   Pneumococcal Conjugate-13 04/22/2017   Pneumococcal Polysaccharide-23 02/04/2006, 09/01/2018   Respiratory Syncytial Virus Vaccine,Recomb Aduvanted(Arexvy) 09/06/2022   Tdap 09/13/2009   Zoster, Live 10/04/2015    SURGICAL HISTORY She  has a past surgical history that includes Salpingoophorectomy (1978); Breast lumpectomy (Left, 2006); Colonoscopy (N/A, 05/24/2013); Breast biopsy (Left, 2006); and Breast biopsy (Left, 2007). FAMILY HISTORY Her family history includes Breast cancer in her paternal grandmother; Cancer in her sister; Colon cancer in her father; Diabetes in her sister; Heart disease in her sister; Hypertension in her mother. SOCIAL HISTORY She  reports that she has never smoked. She has never used smokeless tobacco. She reports that she does not drink alcohol and does not use drugs.  Review of Systems  Constitutional: Negative.  Negative for malaise/fatigue and weight loss.  HENT: Negative.  Negative for hearing loss and tinnitus.   Eyes: Negative.  Negative for blurred vision and double vision.  Respiratory: Negative.  Negative for cough, sputum production, shortness of breath and wheezing.   Cardiovascular: Negative.  Negative for chest pain, palpitations, orthopnea, claudication,  leg swelling and PND.  Gastrointestinal: Negative.  Negative for abdominal pain, blood in stool, constipation, diarrhea, heartburn, melena, nausea and vomiting.  Genitourinary: Negative.   Musculoskeletal: Negative.  Negative for falls, joint pain and myalgias.  Skin: Negative.  Negative for rash.  Neurological: Negative.  Negative for dizziness, tingling, sensory change, weakness and headaches.  Endo/Heme/Allergies: Negative.  Negative for polydipsia.  Psychiatric/Behavioral: Negative.  Negative for depression, memory loss,  substance abuse and suicidal ideas. The patient is not nervous/anxious and does not have insomnia.   All other systems reviewed and are negative.    Objective:     Today's Vitals   06/17/23 1341  BP: 120/70  Pulse: 61  Temp: (!) 97.4 F (36.3 C)  SpO2: 98%  Weight: 192 lb 6.4 oz (87.3 kg)  Height: 5' 2.5" (1.588 m)   Body mass index is 34.63 kg/m.  General appearance: alert, no distress, WD/WN, female HEENT: normocephalic, sclerae anicteric, TMs pearly, nares patent, no discharge or erythema, pharynx normal Oral cavity: MMM, no lesions Neck: supple, no lymphadenopathy, no thyromegaly, no masses Heart: RRR, normal S1, S2, no murmurs Lungs: CTA bilaterally, no wheezes, rhonchi, or rales Abdomen: +bs, soft, non tender, non distended, no masses, no hepatomegaly, no splenomegaly Musculoskeletal: nontender, no swelling, no obvious deformity Extremities:  no cyanosis, no clubbing, +1 generalized edema bil of ankles/feet Pulses: 2+ symmetric, upper and lower extremities, normal cap refill Neurological: alert, oriented x 3, CN2-12 intact, strength normal upper extremities and lower extremities, sensation normal throughout, DTRs 2+ throughout, no cerebellar signs, gait normal Psychiatric: normal affect, behavior normal, pleasant   Remington Highbaugh, NP   06/17/2023

## 2023-06-17 NOTE — Patient Instructions (Signed)

## 2023-06-18 LAB — CBC WITH DIFFERENTIAL/PLATELET
Absolute Monocytes: 640 cells/uL (ref 200–950)
Basophils Absolute: 32 cells/uL (ref 0–200)
Basophils Relative: 0.4 %
Eosinophils Absolute: 221 cells/uL (ref 15–500)
Eosinophils Relative: 2.8 %
HCT: 39.2 % (ref 35.0–45.0)
Hemoglobin: 13.1 g/dL (ref 11.7–15.5)
Lymphs Abs: 2639 cells/uL (ref 850–3900)
MCH: 30.6 pg (ref 27.0–33.0)
MCHC: 33.4 g/dL (ref 32.0–36.0)
MCV: 91.6 fL (ref 80.0–100.0)
MPV: 10.8 fL (ref 7.5–12.5)
Monocytes Relative: 8.1 %
Neutro Abs: 4369 cells/uL (ref 1500–7800)
Neutrophils Relative %: 55.3 %
Platelets: 196 10*3/uL (ref 140–400)
RBC: 4.28 10*6/uL (ref 3.80–5.10)
RDW: 12.8 % (ref 11.0–15.0)
Total Lymphocyte: 33.4 %
WBC: 7.9 10*3/uL (ref 3.8–10.8)

## 2023-06-18 LAB — COMPLETE METABOLIC PANEL WITH GFR
AG Ratio: 1.9 (calc) (ref 1.0–2.5)
ALT: 15 U/L (ref 6–29)
AST: 18 U/L (ref 10–35)
Albumin: 4.1 g/dL (ref 3.6–5.1)
Alkaline phosphatase (APISO): 73 U/L (ref 37–153)
BUN: 21 mg/dL (ref 7–25)
CO2: 24 mmol/L (ref 20–32)
Calcium: 9.4 mg/dL (ref 8.6–10.4)
Chloride: 104 mmol/L (ref 98–110)
Creat: 0.79 mg/dL (ref 0.60–1.00)
Globulin: 2.2 g/dL (calc) (ref 1.9–3.7)
Glucose, Bld: 85 mg/dL (ref 65–99)
Potassium: 4 mmol/L (ref 3.5–5.3)
Sodium: 138 mmol/L (ref 135–146)
Total Bilirubin: 0.4 mg/dL (ref 0.2–1.2)
Total Protein: 6.3 g/dL (ref 6.1–8.1)
eGFR: 80 mL/min/{1.73_m2} (ref >=60)

## 2023-06-18 LAB — HEMOGLOBIN A1C W/OUT EAG: Hgb A1c MFr Bld: 6.5 % of total Hgb — ABNORMAL HIGH (ref <5.7)

## 2023-08-24 DIAGNOSIS — Z1211 Encounter for screening for malignant neoplasm of colon: Secondary | ICD-10-CM | POA: Diagnosis not present

## 2023-08-24 DIAGNOSIS — K5730 Diverticulosis of large intestine without perforation or abscess without bleeding: Secondary | ICD-10-CM | POA: Diagnosis not present

## 2023-08-24 DIAGNOSIS — K648 Other hemorrhoids: Secondary | ICD-10-CM | POA: Diagnosis not present

## 2023-08-24 DIAGNOSIS — Z800 Family history of malignant neoplasm of digestive organs: Secondary | ICD-10-CM | POA: Diagnosis not present

## 2023-08-24 LAB — HM COLONOSCOPY

## 2023-09-19 ENCOUNTER — Encounter: Payer: Self-pay | Admitting: Internal Medicine

## 2023-09-19 NOTE — Patient Instructions (Signed)

## 2023-09-19 NOTE — Progress Notes (Unsigned)
Future Appointments  Date Time Provider Department  09/20/2023                 6 mo 10:30 AM Lucky Cowboy, MD [facility]  12/27/2023  2:30 PM Adela Glimpse, NP [facility]  04/03/2024                    cpe 10:00 AM Lucky Cowboy, MD [facility]    History of Present Illness:      This very nice 72 y.o. WWF  (widowed 07/22/2020)  presents for 6 month follow up with HTN, HLD, Pre-Diabetes and Vitamin D Deficiency.   Patient has hx/o Lt breast Lumpectomy in 2006.        Patient is treated for HTN (2010) & BP has been controlled at home. Today's BP is 134/80 . Patient has had no complaints of any cardiac type chest pain, palpitations, dyspnea / orthopnea / PND, dizziness, claudication, or dependent edema.        Hyperlipidemia is controlled with diet & meds. Patient denies myalgias or other med SE's. Last Lipids were not at goal:  Lab Results  Component Value Date   CHOL 135 03/15/2023   HDL 56 03/15/2023   LDLCALC 59 03/15/2023   TRIG 110 03/15/2023   CHOLHDL 2.4 03/15/2023     Also, the patient has history of PreDiabetes  (A1c 5.8% /2010 and 6.1% /2011)  and has had no symptoms of reactive hypoglycemia, diabetic polys, paresthesias or visual blurring.  Last A1c was not at goal:  Lab Results  Component Value Date   HGBA1C 6.5 (H) 06/17/2023                                                       Further, the patient also has history of Vitamin D Deficiency ("36" /2008) and supplements vitamin D without any suspected side-effects. Last vitamin D was at goal:  Lab Results  Component Value Date   VD25OH 96 03/15/2023     Current Outpatient Medications  Medication Instructions   aspirin 81 mg  Daily   bisoprolol-hctz  5-6.25 MG tablet Take  1 tablet  every Morning     VITAMIN D   2,000 Units Takes 10,000 iu daily   Magnesium 250 MG TABS Daily   Multiple Vitamin  1 tablet Daily   phentermine  37.5 MG tablet 1/2 -1 TABLET EVERY MORNING   rosuvastatin  20 MG  tablet Take 1 tablet Daily    vitamin C  1,000 mg Daily   Zinc 50 MG TABS Oral    Allergies  Allergen Reactions   Nitrofurantoin Monohyd Macro Nausea Only     PMHx:   Past Medical History:  Diagnosis Date   Cancer (HCC)    Breast   DCIS (ductal carcinoma in situ) of breast    left   Hypercholesteremia    Osteopenia 04/2018   T score -1.7.  Overall stable from prior DEXA   Serous cyst of ovary    left     Immunization History  Administered Date(s) Administered   DTaP 06/26/1999   Influenza Split 09/05/2014, 10/04/2015   Influenza Whole 09/01/2013   Influenza, High Dose  09/01/2018, 09/11/2019, 09/12/2020   Influenza 09/09/2016   PFIZER Covid-19  Vacc 11/05/2020   PFIZER-SARS-COV-2 Vacc 03/04/2020, 03/26/2020  PPD Test 03/05/2014, 03/11/2015, 03/30/2016   Pneumococcal -13 04/22/2017   Pneumococcal -23 02/04/2006, 09/01/2018   Tdap 09/13/2009   Zoster, Live 10/04/2015     Past Surgical History:  Procedure Laterality Date   BREAST LUMPECTOMY Left 2006   COLONOSCOPY N/A 05/24/2013   Dr Edd Arbour   SALPINGOOPHORECTOMY  1978   left    FHx:    Reviewed / unchanged  SHx:    Reviewed / unchanged   Systems Review:  Constitutional: Denies fever, chills, wt changes, headaches, insomnia, fatigue, night sweats, change in appetite. Eyes: Denies redness, blurred vision, diplopia, discharge, itchy, watery eyes.  ENT: Denies discharge, congestion, post nasal drip, epistaxis, sore throat, earache, hearing loss, dental pain, tinnitus, vertigo, sinus pain, snoring.  CV: Denies chest pain, palpitations, irregular heartbeat, syncope, dyspnea, diaphoresis, orthopnea, PND, claudication or edema. Respiratory: denies cough, dyspnea, DOE, pleurisy, hoarseness, laryngitis, wheezing.  Gastrointestinal: Denies dysphagia, odynophagia, heartburn, reflux, water brash, abdominal pain or cramps, nausea, vomiting, bloating, diarrhea, constipation, hematemesis, melena, hematochezia  or  hemorrhoids. Genitourinary: Denies dysuria, frequency, urgency, nocturia, hesitancy, discharge, hematuria or flank pain. Musculoskeletal: Denies arthralgias, myalgias, stiffness, jt. swelling, pain, limping or strain/sprain.  Skin: Denies pruritus, rash, hives, warts, acne, eczema or change in skin lesion(s). Neuro: No weakness, tremor, incoordination, spasms, paresthesia or pain. Psychiatric: Denies confusion, memory loss or sensory loss. Endo: Denies change in weight, skin or hair change.  Heme/Lymph: No excessive bleeding, bruising or enlarged lymph nodes.  Physical Exam  BP 134/80   Pulse 65   Temp 97.9 F (36.6 C)   Resp 16   Ht 5' 2.5" (1.588 m)   Wt 193 lb 3.2 oz (87.6 kg)   SpO2 98%   BMI 34.77 kg/m   Appears  well nourished, well groomed  and in no distress.  Eyes: PERRLA, EOMs, conjunctiva no swelling or erythema. Sinuses: No frontal/maxillary tenderness ENT/Mouth: EAC's clear, TM's nl w/o erythema, bulging. Nares clear w/o erythema, swelling, exudates. Oropharynx clear without erythema or exudates. Oral hygiene is good. Tongue normal, non obstructing. Hearing intact.  Neck: Supple. Thyroid not palpable. Car 2+/2+ without bruits, nodes or JVD. Chest: Respirations nl with BS clear & equal w/o rales, rhonchi, wheezing or stridor.  Cor: Heart sounds normal w/ regular rate and rhythm without sig. murmurs, gallops, clicks or rubs. Peripheral pulses normal and equal  without edema.  Abdomen: Soft & bowel sounds normal. Non-tender w/o guarding, rebound, hernias, masses or organomegaly.  Lymphatics: Unremarkable.  Musculoskeletal: Full ROM all peripheral extremities, joint stability, 5/5 strength and normal gait.  Skin: Warm, dry without exposed rashes, lesions or ecchymosis apparent.  Neuro: Cranial nerves intact, reflexes equal bilaterally. Sensory-motor testing grossly intact. Tendon reflexes grossly intact.  Pysch: Alert & oriented x 3.  Insight and judgement nl &  appropriate. No ideations.  Assessment and Plan:  1. Essential hypertension  - CBC with Differential/Platelet - COMPLETE METABOLIC PANEL WITH GFR - Magnesium - TSH  2. Hyperlipidemia, mixed  - Lipid panel - TSH  3. Abnormal glucose  - Hemoglobin A1c - Insulin, random  4. Vitamin D deficiency  - VITAMIN D 25 Hydroxy  5. Need for immunization against influenza   6. Medication management  - CBC with Differential/Platelet - COMPLETE METABOLIC PANEL WITH GFR - Magnesium - Lipid panel - TSH - Hemoglobin A1c - Insulin, random - VITAMIN D 25 Hydroxy         Discussed  regular exercise, BP monitoring, weight control to achieve/maintain BMI less than 25  and discussed med and SE's. Recommended labs to assess and monitor clinical status with further disposition pending results of labs.  I discussed the assessment and treatment plan with the patient. The patient was provided an opportunity to ask questions and all were answered. The patient agreed with the plan and demonstrated an understanding of the instructions.  I provided over 30 minutes of exam, counseling, chart review and  complex critical decision making.        The patient was advised to call back or seek an in-person evaluation if the symptoms worsen or if the condition fails to improve as anticipated.   Marinus Maw, MD .

## 2023-09-20 ENCOUNTER — Ambulatory Visit (INDEPENDENT_AMBULATORY_CARE_PROVIDER_SITE_OTHER): Payer: Medicare Other | Admitting: Internal Medicine

## 2023-09-20 ENCOUNTER — Encounter: Payer: Self-pay | Admitting: Internal Medicine

## 2023-09-20 VITALS — BP 134/80 | HR 65 | Temp 97.9°F | Resp 16 | Ht 62.5 in | Wt 193.2 lb

## 2023-09-20 DIAGNOSIS — E782 Mixed hyperlipidemia: Secondary | ICD-10-CM | POA: Diagnosis not present

## 2023-09-20 DIAGNOSIS — I10 Essential (primary) hypertension: Secondary | ICD-10-CM

## 2023-09-20 DIAGNOSIS — R7309 Other abnormal glucose: Secondary | ICD-10-CM

## 2023-09-20 DIAGNOSIS — Z79899 Other long term (current) drug therapy: Secondary | ICD-10-CM

## 2023-09-20 DIAGNOSIS — E559 Vitamin D deficiency, unspecified: Secondary | ICD-10-CM

## 2023-09-20 DIAGNOSIS — Z23 Encounter for immunization: Secondary | ICD-10-CM

## 2023-09-21 ENCOUNTER — Encounter: Payer: Self-pay | Admitting: Internal Medicine

## 2023-09-21 DIAGNOSIS — E1129 Type 2 diabetes mellitus with other diabetic kidney complication: Secondary | ICD-10-CM | POA: Insufficient documentation

## 2023-09-21 LAB — CBC WITH DIFFERENTIAL/PLATELET
Absolute Monocytes: 680 {cells}/uL (ref 200–950)
Basophils Absolute: 51 {cells}/uL (ref 0–200)
Basophils Relative: 0.6 %
Eosinophils Absolute: 170 {cells}/uL (ref 15–500)
Eosinophils Relative: 2 %
HCT: 41.8 % (ref 35.0–45.0)
Hemoglobin: 13.7 g/dL (ref 11.7–15.5)
Lymphs Abs: 2448 {cells}/uL (ref 850–3900)
MCH: 31.1 pg (ref 27.0–33.0)
MCHC: 32.8 g/dL (ref 32.0–36.0)
MCV: 94.8 fL (ref 80.0–100.0)
MPV: 10.7 fL (ref 7.5–12.5)
Monocytes Relative: 8 %
Neutro Abs: 5151 {cells}/uL (ref 1500–7800)
Neutrophils Relative %: 60.6 %
Platelets: 206 10*3/uL (ref 140–400)
RBC: 4.41 10*6/uL (ref 3.80–5.10)
RDW: 12.6 % (ref 11.0–15.0)
Total Lymphocyte: 28.8 %
WBC: 8.5 10*3/uL (ref 3.8–10.8)

## 2023-09-21 LAB — VITAMIN D 25 HYDROXY (VIT D DEFICIENCY, FRACTURES): Vit D, 25-Hydroxy: 74 ng/mL (ref 30–100)

## 2023-09-21 LAB — COMPLETE METABOLIC PANEL WITH GFR
AG Ratio: 1.8 (calc) (ref 1.0–2.5)
ALT: 18 U/L (ref 6–29)
AST: 20 U/L (ref 10–35)
Albumin: 4.2 g/dL (ref 3.6–5.1)
Alkaline phosphatase (APISO): 78 U/L (ref 37–153)
BUN: 18 mg/dL (ref 7–25)
CO2: 27 mmol/L (ref 20–32)
Calcium: 9.7 mg/dL (ref 8.6–10.4)
Chloride: 103 mmol/L (ref 98–110)
Creat: 0.9 mg/dL (ref 0.60–1.00)
Globulin: 2.4 g/dL (ref 1.9–3.7)
Glucose, Bld: 89 mg/dL (ref 65–99)
Potassium: 4.9 mmol/L (ref 3.5–5.3)
Sodium: 139 mmol/L (ref 135–146)
Total Bilirubin: 0.5 mg/dL (ref 0.2–1.2)
Total Protein: 6.6 g/dL (ref 6.1–8.1)
eGFR: 68 mL/min/{1.73_m2} (ref >=60)

## 2023-09-21 LAB — LIPID PANEL
Cholesterol: 159 mg/dL (ref <200)
HDL: 54 mg/dL (ref >=50)
LDL Cholesterol (Calc): 81 mg/dL
Non-HDL Cholesterol (Calc): 105 mg/dL (ref <130)
Total CHOL/HDL Ratio: 2.9 (calc) (ref <5.0)
Triglycerides: 137 mg/dL (ref <150)

## 2023-09-21 LAB — HEMOGLOBIN A1C
Hgb A1c MFr Bld: 6.4 %{Hb} — ABNORMAL HIGH (ref <5.7)
Mean Plasma Glucose: 137 mg/dL
eAG (mmol/L): 7.6 mmol/L

## 2023-09-21 LAB — MAGNESIUM: Magnesium: 1.8 mg/dL (ref 1.5–2.5)

## 2023-09-21 LAB — TSH: TSH: 1.21 m[IU]/L (ref 0.40–4.50)

## 2023-09-21 LAB — INSULIN, RANDOM: Insulin: 21 u[IU]/mL — ABNORMAL HIGH

## 2023-09-21 NOTE — Progress Notes (Signed)
<>*<>*<>*<>*<>*<>*<>*<>*<>*<>*<>*<>*<>*<>*<>*<>*<>*<>*<>*<>*<>*<>*<>*<>*<> <>*<>*<>*<>*<>*<>*<>*<>*<>*<>*<>*<>*<>*<>*<>*<>*<>*<>*<>*<>*<>*<>*<>*<>*<>  - >   A1c = 6.4% and since  A1c = 6.5% in July                                                                           - is now considered Diabetic   - So if you would like to try one of the Shots every 1 to 2 weeks to                                                                    help lose weight - Insurance should cover !   <>*<>*<>*<>*<>*<>*<>*<>*<>*<>*<>*<>*<>*<>*<>*<>*<>*<>*<>*<>*<>*<>*<>*<>*<> <>*<>*<>*<>*<>*<>*<>*<>*<>*<>*<>*<>*<>*<>*<>*<>*<>*<>*<>*<>*<>*<>*<>*<>*<>  -   Chol = 159   &   LDL = 81  - Both  Excellent   - Very low risk for Heart Attack  / Stroke  <>*<>*<>*<>*<>*<>*<>*<>*<>*<>*<>*<>*<>*<>*<>*<>*<>*<>*<>*<>*<>*<>*<>*<>*<> <>*<>*<>*<>*<>*<>*<>*<>*<>*<>*<>*<>*<>*<>*<>*<>*<>*<>*<>*<>*<>*<>*<>*<>*<>  -  Vitamin D = 74  - Excellent - Please keep dosage same   <>*<>*<>*<>*<>*<>*<>*<>*<>*<>*<>*<>*<>*<>*<>*<>*<>*<>*<>*<>*<>*<>*<>*<>*<> <>*<>*<>*<>*<>*<>*<>*<>*<>*<>*<>*<>*<>*<>*<>*<>*<>*<>*<>*<>*<>*<>*<>*<>*<>  -   All Else - CBC - Kidneys - Electrolytes - Liver - Magnesium & Thyroid    - all  Normal / OK  <>*<>*<>*<>*<>*<>*<>*<>*<>*<>*<>*<>*<>*<>*<>*<>*<>*<>*<>*<>*<>*<>*<>*<>*<> <>*<>*<>*<>*<>*<>*<>*<>*<>*<>*<>*<>*<>*<>*<>*<>*<>*<>*<>*<>*<>*<>*<>*<>*<>                              <>*<>*<>*<>*<>*<>*<>*<>*<>*<>*<>*<>*<>*<>*<>*<>*<>*<>*<>*<>*<>*<>*<>*<>*<> <>*<>*<>*<>*<>*<>*<>*<>*<>*<>*<>*<>*<>*<>*<>*<>*<>*<>*<>*<>*<>*<>*<>*<>*<>  -

## 2023-09-22 ENCOUNTER — Encounter: Payer: Self-pay | Admitting: Internal Medicine

## 2023-09-28 DIAGNOSIS — Z23 Encounter for immunization: Secondary | ICD-10-CM | POA: Diagnosis not present

## 2023-10-04 DIAGNOSIS — H5213 Myopia, bilateral: Secondary | ICD-10-CM | POA: Diagnosis not present

## 2023-10-04 DIAGNOSIS — H2513 Age-related nuclear cataract, bilateral: Secondary | ICD-10-CM | POA: Diagnosis not present

## 2023-10-04 DIAGNOSIS — H31001 Unspecified chorioretinal scars, right eye: Secondary | ICD-10-CM | POA: Diagnosis not present

## 2023-11-26 ENCOUNTER — Ambulatory Visit: Payer: Medicare Other | Admitting: Nurse Practitioner

## 2023-12-24 ENCOUNTER — Other Ambulatory Visit: Payer: Self-pay | Admitting: Internal Medicine

## 2023-12-24 DIAGNOSIS — Z1231 Encounter for screening mammogram for malignant neoplasm of breast: Secondary | ICD-10-CM

## 2023-12-27 ENCOUNTER — Ambulatory Visit: Payer: Medicare Other | Admitting: Nurse Practitioner

## 2024-01-04 ENCOUNTER — Ambulatory Visit (INDEPENDENT_AMBULATORY_CARE_PROVIDER_SITE_OTHER): Payer: Medicare Other | Admitting: Nurse Practitioner

## 2024-01-04 ENCOUNTER — Encounter: Payer: Self-pay | Admitting: Nurse Practitioner

## 2024-01-04 VITALS — BP 136/80 | HR 70 | Temp 97.9°F | Resp 16 | Ht 62.5 in | Wt 200.0 lb

## 2024-01-04 DIAGNOSIS — R6889 Other general symptoms and signs: Secondary | ICD-10-CM

## 2024-01-04 DIAGNOSIS — R7309 Other abnormal glucose: Secondary | ICD-10-CM

## 2024-01-04 DIAGNOSIS — I10 Essential (primary) hypertension: Secondary | ICD-10-CM

## 2024-01-04 DIAGNOSIS — E66812 Obesity, class 2: Secondary | ICD-10-CM | POA: Diagnosis not present

## 2024-01-04 DIAGNOSIS — Z86000 Personal history of in-situ neoplasm of breast: Secondary | ICD-10-CM | POA: Diagnosis not present

## 2024-01-04 DIAGNOSIS — E782 Mixed hyperlipidemia: Secondary | ICD-10-CM | POA: Diagnosis not present

## 2024-01-04 DIAGNOSIS — E559 Vitamin D deficiency, unspecified: Secondary | ICD-10-CM | POA: Diagnosis not present

## 2024-01-04 DIAGNOSIS — Z0000 Encounter for general adult medical examination without abnormal findings: Secondary | ICD-10-CM

## 2024-01-04 DIAGNOSIS — Z0001 Encounter for general adult medical examination with abnormal findings: Secondary | ICD-10-CM

## 2024-01-04 NOTE — Progress Notes (Signed)
MEDICARE ANNUAL WELLNESS VISIT AND 3 MONTH  Assessment:   Encounter for Medicare annual wellness exam Due annually  Health maintenance reviewed Healthily lifestyle goals set  Essential hypertension Discussed DASH (Dietary Approaches to Stop Hypertension) DASH diet is lower in sodium than a typical American diet. Cut back on foods that are high in saturated fat, cholesterol, and trans fats. Eat more whole-grain foods, fish, poultry, and nuts Remain active and exercise as tolerated daily.  Monitor BP at home-Call if greater than 130/80.   History of ductal carcinoma in situ (DCIS) of breast, left, s/p resection UTD on mammograms Continue to monitor  Hyperlipidemia Discussed lifestyle modifications. Recommended diet heavy in fruits and veggies, omega 3's. Decrease consumption of animal meats, cheeses, and dairy products. Remain active and exercise as tolerated. Continue to monitor. Check lipids/TSH  Prediabetes Education: Reviewed 'ABCs' of diabetes management  Discussed goals to be met and/or maintained include A1C (<7) Blood pressure (<130/80) Cholesterol (LDL <70) Continue Eye Exam yearly  Continue Dental Exam Q6 mo Discussed dietary recommendations Discussed Physical Activity recommendations  Vitamin D deficiency Continue supplement  Medication management CBC, CMP/GFR, magnesium   Obesity Discussed appropriate BMI Diet modification. Physical activity. Encouraged/praised to build confidence.  Review of lab work stable - obtain NOV.  Notify office for further evaluation and treatment, questions or concerns if any reported s/s fail to improve.   The patient was advised to call back or seek an in-person evaluation if any symptoms worsen or if the condition fails to improve as anticipated.   Further disposition pending results of labs. Discussed med's effects and SE's.    I discussed the assessment and treatment plan with the patient. The patient was provided  an opportunity to ask questions and all were answered. The patient agreed with the plan and demonstrated an understanding of the instructions.  Discussed med's effects and SE's. Screening labs and tests as requested with regular follow-up as recommended.  I provided 35 minutes of face-to-face time during this encounter including counseling, chart review, and critical decision making was preformed.  Today's Plan of Care is based on a patient-centered health care approach known as shared decision making - the decisions, tests and treatments allow for patient preferences and values to be balanced with clinical evidence.    Future Appointments  Date Time Provider Department Center  01/28/2024 11:10 AM [facility] 2 [facility]  04/03/2024 10:00 AM Lucky Cowboy, MD [facility] None     Plan:   During the course of the visit the patient was educated and counseled about appropriate screening and preventive services including:   Pneumococcal vaccine  Prevnar 13 Influenza vaccine Td vaccine Screening electrocardiogram Bone densitometry screening Colorectal cancer screening Diabetes screening Glaucoma screening Nutrition counseling  Advanced directives: requested   Subjective:  Shawna [name] is a 73 y.o. female who presents for Medicare Annual Wellness Visit and OV.     Patient is s/p lumpectomy of L breast DCIS in 2006. Continues with annual mammograms via breast center.   BMI is Body mass index is 36 kg/m., she is working on diet and exercise. Wt Readings from Last 3 Encounters:  01/04/24 200 lb (90.7 kg)  09/20/23 193 lb 3.2 oz (87.6 kg)  06/17/23 192 lb 6.4 oz (87.3 kg)    Her blood pressure has been controlled at home, today their BP is BP: 136/80  She does not workout but has part time job and takes care of her husband. She denies chest pain, shortness of  breath, dizziness.   She is on cholesterol medication, Crestor 20 mg QD and denies myalgias. Her cholesterol  is at goal. The cholesterol last visit was:   Lab Results  Component Value Date   CHOL 159 09/20/2023   HDL 54 09/20/2023   LDLCALC 81 09/20/2023   TRIG 137 09/20/2023   CHOLHDL 2.9 09/20/2023    She has been working on diet and exercise for prediabetes, and denies paresthesia of the feet, polydipsia, polyuria and visual disturbances. Last A1C in the office was:  Lab Results  Component Value Date   HGBA1C 6.4 (H) 09/20/2023   Last GFR: Lab Results  Component Value Date   GFRNONAA 74 05/06/2021   Patient is on Vitamin D supplement.   Lab Results  Component Value Date   VD25OH 74 09/20/2023       Medication Review: Current Outpatient Medications on File Prior to Visit  Medication Sig Dispense Refill   Ascorbic Acid (VITAMIN C) 1000 MG tablet Take 1,000 mg by mouth daily.     aspirin 81 MG tablet Take 81 mg by mouth daily.     bisoprolol-hydrochlorothiazide (ZIAC) 5-6.25 MG tablet Take  1 tablet  every Morning  for BP 90 tablet 3   Cholecalciferol (VITAMIN D PO) Take 2,000 Units by mouth. Takes 16109 iu daily     Magnesium 250 MG TABS Take by mouth daily.     Multiple Vitamin (MULTIVITAMIN) tablet Take 1 tablet by mouth daily.     phentermine (ADIPEX-P) 37.5 MG tablet 1/2 TO 1 TABLET EVERY MORNING FOR DIETING AND WEIGHT LOSS 90 tablet 0   rosuvastatin (CRESTOR) 20 MG tablet Take 1 tablet Daily for Cholesterol 90 tablet 3   Zinc 50 MG TABS Take by mouth.     No current facility-administered medications on file prior to visit.    Allergies  Allergen Reactions   Nitrofurantoin Monohyd Macro Nausea Only    Current Problems (verified) Patient Active Problem List   Diagnosis Date Noted   Type 2 diabetes mellitus with CKD2  ( July 2024) [facility]) 09/21/2023   Medication management 09/05/2014   Obesity (BMI 30.0-34.9) 09/05/2014   Essential hypertension 11/24/2013   Hyperlipidemia, mixed 11/24/2013   Abnormal glucose (prediabetes) 11/24/2013   Vitamin D deficiency 11/24/2013    DCIS (ductal carcinoma in situ) of breast, Left lumpectomy. 09/23/2005.     Screening Tests Immunization History  Administered Date(s) Administered   DTaP 06/26/1999   Influenza Split 09/05/2014, 10/04/2015   Influenza Whole 09/01/2013   Influenza, High Dose Seasonal PF 08/12/2017, 09/01/2018, 09/11/2019, 09/12/2020, 09/22/2021, 09/23/2022, 09/20/2023   Influenza-Unspecified 09/09/2016   PFIZER Comirnaty(Gray Top)Covid-19 Tri-Sucrose Vaccine 11/05/2020   PFIZER(Purple Top)SARS-COV-2 Vaccination 03/04/2020, 03/26/2020   PPD Test 03/05/2014, 03/11/2015, 03/30/2016   Pneumococcal Conjugate-13 04/22/2017   Pneumococcal Polysaccharide-23 02/04/2006, 09/01/2018   Respiratory Syncytial Virus Vaccine,Recomb Aduvanted(Arexvy) 09/06/2022, 09/28/2023   Tdap 09/13/2009   Zoster, Live 10/04/2015   Preventative care: Last colonoscopy: 06/17/2018, q 5 years, dad with colon cancer, Dr. Randa Evens due 2024 - has scheduled Last mammogram: UTD 2024 negative repeat 1 year Last pap smear/pelvic exam: 2019, due 12/2018 follows Dr. Audie Box DEXA: 04/01/21, osteopenia Xray Cervical spine 2014  Prior vaccinations: TD or Tdap: 2010, declines, will get if needed Influenza: 09/2023 Pneumococcal: 2019 Prevnar13: 2018 Shingles/Zostavax: 2016  Names of Other Physician/Practitioners you currently use: 1. [facility] here for primary care 2. Dr. Emily Filbert, eye doctor, last visit 2024 3. Dr. Denyse Amass, dentist, last visit 2024, q 6  months   Patient Care Team: Lucky Cowboy, MD as PCP - General (Internal Medicine)  SURGICAL HISTORY She  has a past surgical history that includes Salpingoophorectomy (1978); Breast lumpectomy (Left, 2006); Colonoscopy (N/A, 05/24/2013); Breast biopsy (Left, 2006); and Breast biopsy (Left, 2007). FAMILY HISTORY Her family history includes Breast cancer in her paternal grandmother; Cancer in her sister; Colon cancer in her father; Diabetes in  her sister; Heart disease in her sister; Hypertension in her mother. SOCIAL HISTORY She  reports that she has never smoked. She has never used smokeless tobacco. She reports that she does not drink alcohol and does not use drugs.   MEDICARE WELLNESS OBJECTIVES: Physical activity: Current Exercise Habits: The patient does not participate in regular exercise at present Cardiac risk factors:   Depression/mood screen:      01/04/2024   10:46 AM  Depression screen PHQ 2/9  Decreased Interest 0  Down, Depressed, Hopeless 0  PHQ - 2 Score 0    ADLs:     01/04/2024   10:46 AM 09/19/2023   10:20 PM  In your present state of health, do you have any difficulty performing the following activities:  Hearing? 0 0  Vision? 0 0  Difficulty concentrating or making decisions? 0 0  Walking or climbing stairs? 0 0  Dressing or bathing? 0 0  Doing errands, shopping? 0 0      Cognitive Testing  Alert? Yes  Normal Appearance?Yes  Oriented to person? Yes  Place? Yes   Time? Yes  Recall of three objects?  Yes  Can perform simple calculations? Yes  Displays appropriate judgment?Yes  Can read the correct time from a watch face?Yes  EOL planning: Does Patient Have a Medical Advance Directive?: No  Review of Systems  Constitutional: Negative.  Negative for malaise/fatigue and weight loss.  HENT: Negative.  Negative for hearing loss and tinnitus.   Eyes: Negative.  Negative for blurred vision and double vision.  Respiratory: Negative.  Negative for cough, sputum production, shortness of breath and wheezing.   Cardiovascular: Negative.  Negative for chest pain, palpitations, orthopnea, claudication, leg swelling and PND.  Gastrointestinal: Negative.  Negative for abdominal pain, blood in stool, constipation, diarrhea, heartburn, melena, nausea and vomiting.  Genitourinary: Negative.   Musculoskeletal: Negative.  Negative for falls, joint pain and myalgias.  Skin: Negative.  Negative for rash.   Neurological: Negative.  Negative for dizziness, tingling, sensory change, weakness and headaches.  Endo/Heme/Allergies: Negative.  Negative for polydipsia.  Psychiatric/Behavioral: Negative.  Negative for depression, memory loss, substance abuse and suicidal ideas. The patient is not nervous/anxious and does not have insomnia.   All other systems reviewed and are negative.    Objective:     Today's Vitals   01/04/24 1005  BP: 136/80  Pulse: 70  Resp: 16  Temp: 97.9 F (36.6 C)  SpO2: 98%  Weight: 200 lb (90.7 kg)  Height: 5' 2.5" (1.588 m)   Body mass index is 36 kg/m.  General appearance: alert, no distress, WD/WN, female HEENT: normocephalic, sclerae anicteric, TMs pearly, nares patent, no discharge or erythema, pharynx normal Oral cavity: MMM, no lesions Neck: supple, no lymphadenopathy, no thyromegaly, no masses Heart: RRR, normal S1, S2, no murmurs Lungs: CTA bilaterally, no wheezes, rhonchi, or rales Abdomen: +bs, soft, non tender, non distended, no masses, no hepatomegaly, no splenomegaly Musculoskeletal: nontender, no swelling, no obvious deformity Extremities:  no cyanosis, no clubbing, nonpitting edema bil of ankles/feet Pulses: 2+ symmetric, upper and lower extremities, normal cap  refill Neurological: alert, oriented x 3, CN2-12 intact, strength normal upper extremities and lower extremities, sensation normal throughout, DTRs 2+ throughout, no cerebellar signs, gait normal Psychiatric: normal affect, behavior normal, pleasant   Medicare Attestation I have personally reviewed: The patient's medical and social history Their use of alcohol, tobacco or illicit drugs Their current medications and supplements The patient's functional ability including ADLs,fall risks, home safety risks, cognitive, and hearing and visual impairment Diet and physical activities Evidence for depression or mood disorders  The patient's weight, height, BMI, and visual acuity have been  recorded in the chart.  I have made referrals, counseling, and provided education to the patient based on review of the above and I have provided the patient with a written personalized care plan for preventive services.     Adela Glimpse, NP   01/04/2024

## 2024-01-04 NOTE — Patient Instructions (Signed)

## 2024-01-28 ENCOUNTER — Ambulatory Visit
Admission: RE | Admit: 2024-01-28 | Discharge: 2024-01-28 | Disposition: A | Discharge status: Home / Self Care | Payer: Medicare Other | Source: Ambulatory Visit | Attending: Internal Medicine | Admitting: Internal Medicine

## 2024-01-28 DIAGNOSIS — Z1231 Encounter for screening mammogram for malignant neoplasm of breast: Secondary | ICD-10-CM

## 2024-02-10 ENCOUNTER — Encounter: Payer: Self-pay | Admitting: *Deleted

## 2024-02-10 ENCOUNTER — Other Ambulatory Visit: Payer: Self-pay

## 2024-02-10 DIAGNOSIS — I10 Essential (primary) hypertension: Secondary | ICD-10-CM

## 2024-02-10 MED ORDER — ROSUVASTATIN CALCIUM 20 MG PO TABS: ORAL_TABLET | ORAL | 0 refills | Status: DC

## 2024-02-10 MED ORDER — BISOPROLOL-HYDROCHLOROTHIAZIDE 5-6.25 MG PO TABS: ORAL_TABLET | ORAL | 0 refills | Status: DC

## 2024-03-17 ENCOUNTER — Encounter: Payer: Medicare Other | Admitting: Internal Medicine

## 2024-04-03 ENCOUNTER — Encounter: Payer: Medicare Other | Admitting: Internal Medicine

## 2024-04-18 ENCOUNTER — Ambulatory Visit (INDEPENDENT_AMBULATORY_CARE_PROVIDER_SITE_OTHER): Payer: Medicare Other | Admitting: Nurse Practitioner

## 2024-04-18 VITALS — BP 124/70 | HR 62 | Ht 62.0 in | Wt 200.0 lb

## 2024-04-18 DIAGNOSIS — M8589 Other specified disorders of bone density and structure, multiple sites: Secondary | ICD-10-CM

## 2024-04-18 DIAGNOSIS — Z01419 Encounter for gynecological examination (general) (routine) without abnormal findings: Secondary | ICD-10-CM | POA: Diagnosis not present

## 2024-04-18 DIAGNOSIS — Z780 Asymptomatic menopausal state: Secondary | ICD-10-CM | POA: Diagnosis not present

## 2024-04-18 NOTE — Progress Notes (Signed)
   Shawna [name] [facility] 28-Feb-1951 [id number]   History:  73 y.o. G0 presents for breast and pelvic exam. No GYN complaints. Postmenopausal - no HRT, no bleeding. Normal pap and mammogram history. History of left DCIS in 2006, 1970 LSO for cyst. HLD managed by PCP.   Gynecologic History No LMP recorded. Patient is postmenopausal.   Contraception: post menopausal status Sexually active: No  Health Maintenance Last Pap: 02/29/2020. Results were: Normal Last mammogram: 01/28/2024. Results were: Normal Last colonoscopy: 08/24/2023. Results were: Normal, 5-year recall d/t family hx Last Dexa: 04/01/2021 . Results were: T-score -1.7, FRAX 9.7% / 1.4%  Past medical history, past surgical history, family history and social history were all reviewed and documented in the EPIC chart. Widowed. Retired. Father with history of colon cancer.   ROS:  A ROS was performed and pertinent positives and negatives are included.  Exam:  Vitals:   04/18/24 1130  BP: 124/70  Pulse: 62  SpO2: 98%  Weight: 200 lb (90.7 kg)  Height: 5\' 2"  (1.575 m)     Body mass index is 36.58 kg/m.  General appearance:  Normal Thyroid :  Symmetrical, normal in size, without palpable masses or nodularity. Respiratory  Auscultation:  Clear without wheezing or rhonchi Cardiovascular  Auscultation:  Regular rate, without rubs, murmurs or gallops  Edema/varicosities:  Not grossly evident Abdominal  Soft,nontender, without masses, guarding or rebound.  Liver/spleen:  No organomegaly noted  Hernia:  None appreciated  Skin  Inspection:  Grossly normal   Breasts: Examined lying and sitting.   Right: Without masses, retractions, discharge or axillary adenopathy.   Left: Without masses, retractions, discharge or axillary adenopathy. Pelvic: External genitalia:  no lesions              Urethra:  normal appearing urethra with no masses, tenderness or lesions              Bartholins and Skenes: normal                 Vagina:  normal appearing vagina with normal color and discharge, no lesions. Atrophic changes              Cervix: no lesions Bimanual Exam:  Uterus:  no masses or tenderness              Adnexa: no mass, fullness, tenderness              Rectovaginal: Deferred              Anus:  normal, no lesions  Doria Garden, NP student present as chaperone.   Assessment/Plan:  73 y.o. G0 for breast and pelvic exam.   Encounter for breast and pelvic examination - Education provided on SBEs, importance of preventative screenings, current guidelines, high calcium  diet, regular exercise, and multivitamin daily. Labs with PCP.   Postmenopausal - no HRT, no bleeding.   Osteopenia of multiple sites - Plan: DG Bone Density. 03/2021 T-score -1.7. Continue Vitamin D  supplement and increase exercise. Recommend increasing exercise.  Screening for cervical cancer - Normal Pap history. No longer screening per guidelines.   Screening for breast cancer - History of left DCIS in 2006. Continue annual screenings.  Normal breast exam today.  Screening for colon cancer - 2024 colonoscopy. Will repeat at GI's recommended interval. Father with history of colon cancer in his 39s.   Return in about 2 years (around 04/18/2026) for B&P.     Shawna Bamberger DNP, 11:44 AM 04/18/2024

## 2024-04-19 NOTE — Addendum Note (Signed)
 Addended byAntonio Klinefelter on: 04/19/2024 01:23 PM   Modules accepted: Level of Service

## 2024-05-03 ENCOUNTER — Encounter: Payer: Self-pay | Admitting: Family Medicine

## 2024-05-03 ENCOUNTER — Ambulatory Visit (INDEPENDENT_AMBULATORY_CARE_PROVIDER_SITE_OTHER): Admitting: Family Medicine

## 2024-05-03 VITALS — BP 134/68 | HR 56 | Temp 97.7°F | Ht 62.25 in | Wt 194.7 lb

## 2024-05-03 DIAGNOSIS — R7303 Prediabetes: Secondary | ICD-10-CM | POA: Insufficient documentation

## 2024-05-03 DIAGNOSIS — E782 Mixed hyperlipidemia: Secondary | ICD-10-CM | POA: Diagnosis not present

## 2024-05-03 DIAGNOSIS — I10 Essential (primary) hypertension: Secondary | ICD-10-CM | POA: Diagnosis not present

## 2024-05-03 DIAGNOSIS — R600 Localized edema: Secondary | ICD-10-CM | POA: Diagnosis not present

## 2024-05-03 LAB — POCT GLYCOSYLATED HEMOGLOBIN (HGB A1C): Hemoglobin A1C: 6.2 % — AB (ref 4.0–5.6)

## 2024-05-03 MED ORDER — BISOPROLOL-HYDROCHLOROTHIAZIDE 5-6.25 MG PO TABS: ORAL_TABLET | ORAL | 1 refills | Status: DC

## 2024-05-03 MED ORDER — ROSUVASTATIN CALCIUM 20 MG PO TABS: ORAL_TABLET | ORAL | 1 refills | Status: DC

## 2024-05-03 NOTE — Assessment & Plan Note (Signed)
 On rosuvastatin  20 mg daily, reviewed last lipid panel, well controlled, continue as prescribed.

## 2024-05-03 NOTE — Progress Notes (Signed)
 New Patient Office Visit  Subjective   Patient ID: Shawna [name], female    DOB: [birth year]  Age: 73 y.o. MRN: [id number]  CC:  Chief Complaint  Patient presents with   Establish Care    HPI Shawna [name] presents to establish care Pt was a previous patient of Dr. Cassondra Cliff who recently passed away.  Patient does not have any new symptoms or issues to report today.   Patient has a history of HTN and is taking rosuvastatin  for her cholesterol management. She reports she is doing well on these medications, no side effects reported. I reviewed her blood work with her from October 2024.  HTN -- BP in office performed and is well controlled. She  reports no side effects to the medications, no chest pain, SOB, dizziness or headaches. She has a BP cuff at home and is checking BP regularly, reports they are in the normal range.   Prediabetes - last A1C was 6.4, she is not currently on any medications, is managing just with diet right now. I spent 30 minutes counseling the patient on reducing sugar and starches and increasing exercise. Pt states she lives by herself, still mows her own yard.   I have reviewed the patient's medical history in detail and updated the computerized patient record.   Current Outpatient Medications  Medication Instructions   Ascorbic Acid (VITAMIN C PO) 500 mg, Daily   bisoprolol -hydrochlorothiazide  (ZIAC ) 5-6.25 MG tablet Take  1 tablet  every Morning  for BP   Cholecalciferol (VITAMIN D  PO) 2,000 Units   Magnesium 250 MG TABS Daily   rosuvastatin  (CRESTOR ) 20 MG tablet Take 1 tablet Daily for Cholesterol   Zinc 50 MG TABS Oral, Every other day    Past Medical History:  Diagnosis Date   Cancer (HCC)    Breast   DCIS (ductal carcinoma in situ) of breast    left   Hypercholesteremia    Osteopenia 04/2018   T score -1.7.  Overall stable from prior DEXA   Personal history of radiation therapy 2007   Serous cyst of ovary    left    Past Surgical  History:  Procedure Laterality Date   BREAST BIOPSY Left 2006   BREAST BIOPSY Left 2007   BREAST LUMPECTOMY Left 2006   COLONOSCOPY N/A 05/24/2013   Dr Almyra Arn   SALPINGOOPHORECTOMY  1978   left    Family History  Problem Relation Age of Onset   Hypertension Mother    Colon cancer Father    Diabetes Sister    Heart disease Sister    Cancer Sister        ? Type   Breast cancer Paternal Grandmother 21 - 61    Social History   Socioeconomic History   Marital status: Widowed    Spouse name: Not on file   Number of children: Not on file   Years of education: Not on file   Highest education level: Not on file  Occupational History   Not on file  Tobacco Use   Smoking status: Never   Smokeless tobacco: Never  Vaping Use   Vaping status: Never Used  Substance and Sexual Activity   Alcohol use: No    Alcohol/week: 0.0 standard drinks of alcohol   Drug use: No   Sexual activity: Not Currently    Birth control/protection: Post-menopausal    Comment: 1st intercourse 73 yo-Fewer than 5 partners,des neg  Other Topics Concern   Not on  file  Social History Narrative   Not on file   Social Drivers of Health   Financial Resource Strain: Not on file  Food Insecurity: Not on file  Transportation Needs: Not on file  Physical Activity: Not on file  Stress: Not on file  Social Connections: Not on file  Intimate Partner Violence: Not on file    Review of Systems  HENT:  Negative for hearing loss.   Eyes:  Negative for blurred vision.  Respiratory:  Negative for shortness of breath.   Cardiovascular:  Negative for chest pain.  Gastrointestinal: Negative.   Genitourinary: Negative.   Musculoskeletal:  Negative for back pain.  Neurological:  Negative for headaches.  Psychiatric/Behavioral:  Negative for depression.   All other systems reviewed and are negative.       Objective   BP 134/68   Pulse (!) 56   Temp 97.7 F (36.5 C) (Oral)   Ht 5' 2.25" (1.581 m)    Wt 194 lb 11.2 oz (88.3 kg)   SpO2 97%   BMI 35.33 kg/m   Physical Exam Vitals reviewed.  Constitutional:      Appearance: Normal appearance. She is well-groomed. She is obese.  Eyes:     Conjunctiva/sclera: Conjunctivae normal.  Neck:     Thyroid : No thyromegaly.  Cardiovascular:     Rate and Rhythm: Normal rate and regular rhythm.     Pulses: Normal pulses.     Heart sounds: S1 normal and S2 normal.  Pulmonary:     Effort: Pulmonary effort is normal.     Breath sounds: Normal breath sounds and air entry.  Abdominal:     General: Bowel sounds are normal.  Musculoskeletal:     Right lower leg: Edema (1+ pitting to the mid calf) present.     Left lower leg: Edema (1-2+ pitting to the mid calf) present.  Neurological:     Mental Status: She is alert and oriented to person, place, and time. Mental status is at baseline.     Gait: Gait is intact.  Psychiatric:        Mood and Affect: Mood and affect normal.        Speech: Speech normal.        Behavior: Behavior normal.        Judgment: Judgment normal.          Assessment & Plan:  Hyperlipidemia, mixed Assessment & Plan: On rosuvastatin  20 mg daily, reviewed last lipid panel, well controlled, continue as prescribed.   Orders: -     Rosuvastatin  Calcium ; Take 1 tablet Daily for Cholesterol  Dispense: 90 tablet; Refill: 1  Essential hypertension Assessment & Plan: Current hypertension medications:       Sig   bisoprolol -hydrochlorothiazide  (ZIAC ) 5-6.25 MG tablet Take  1 tablet  every Morning  for BP      Chronic, stable, BP is well controlled. Will continue this medication as prescribed.  Orders: -     Bisoprolol -hydroCHLOROthiazide ; Take  1 tablet  every Morning  for BP  Dispense: 90 tablet; Refill: 1  Prediabetes Assessment & Plan: Pt is not a full diabetic, I have removed this diagnosis from her list. I spent 30 minutes with patient counseling her on the low carb diet and handouts were given to the  patient. Will continue to monitor her A1C every 6 months. Today it is 6.2  Orders: -     POCT glycosylated hemoglobin (Hb A1C) -     Collection capillary blood specimen  Peripheral edema Assessment & Plan: Chronic per the patient, no sign of renal or cardiac disease currently. Most likely chronic venous insufficiency, recommended reducing salt and using compression stockings during the day, elevated feet when sitting, etc.      Return in about 6 months (around 11/03/2024) for HTN, come fasting for labs.   Aida House, MD

## 2024-05-03 NOTE — Assessment & Plan Note (Signed)
 Pt is not a full diabetic, I have removed this diagnosis from her list. I spent 30 minutes with patient counseling her on the low carb diet and handouts were given to the patient. Will continue to monitor her A1C every 6 months. Today it is 6.2

## 2024-05-03 NOTE — Assessment & Plan Note (Signed)
 Chronic per the patient, no sign of renal or cardiac disease currently. Most likely chronic venous insufficiency, recommended reducing salt and using compression stockings during the day, elevated feet when sitting, etc.

## 2024-05-03 NOTE — Assessment & Plan Note (Signed)
 Current hypertension medications:       Sig   bisoprolol -hydrochlorothiazide  (ZIAC ) 5-6.25 MG tablet Take  1 tablet  every Morning  for BP      Chronic, stable, BP is well controlled. Will continue this medication as prescribed.

## 2024-09-06 ENCOUNTER — Telehealth: Payer: Self-pay | Admitting: *Deleted

## 2024-09-06 MED ORDER — COVID-19 MRNA VACC (MODERNA) 50 MCG/0.5ML IM SUSY: 0.5000 mL | PREFILLED_SYRINGE | Freq: Once | INTRAMUSCULAR | 0 refills | Status: AC

## 2024-09-06 NOTE — Telephone Encounter (Signed)
 Copied from CRM #[id number]. Topic: Clinical - Request for Lab/Test Order >> Sep 06, 2024 12:27 PM Shawna [name] wrote: Reason for CRM: Patient is requesting an order be sent to [facility] Address: [address] [place], [place] [address] Phone: [phone], so she is able to get her covid vaccine.

## 2024-09-06 NOTE — Telephone Encounter (Signed)
 Rx done.

## 2024-09-13 ENCOUNTER — Ambulatory Visit (INDEPENDENT_AMBULATORY_CARE_PROVIDER_SITE_OTHER)

## 2024-09-13 DIAGNOSIS — Z23 Encounter for immunization: Secondary | ICD-10-CM

## 2024-09-20 DIAGNOSIS — Z23 Encounter for immunization: Secondary | ICD-10-CM | POA: Diagnosis not present

## 2024-10-04 DIAGNOSIS — H2513 Age-related nuclear cataract, bilateral: Secondary | ICD-10-CM | POA: Diagnosis not present

## 2024-10-04 DIAGNOSIS — H5212 Myopia, left eye: Secondary | ICD-10-CM | POA: Diagnosis not present

## 2024-11-06 ENCOUNTER — Ambulatory Visit: Admitting: Family Medicine

## 2024-11-06 ENCOUNTER — Encounter: Payer: Self-pay | Admitting: Family Medicine

## 2024-11-06 VITALS — BP 128/64 | HR 60 | Temp 96.7°F | Ht 62.25 in | Wt 197.9 lb

## 2024-11-06 DIAGNOSIS — R7303 Prediabetes: Secondary | ICD-10-CM

## 2024-11-06 DIAGNOSIS — I10 Essential (primary) hypertension: Secondary | ICD-10-CM

## 2024-11-06 DIAGNOSIS — E782 Mixed hyperlipidemia: Secondary | ICD-10-CM

## 2024-11-06 DIAGNOSIS — E559 Vitamin D deficiency, unspecified: Secondary | ICD-10-CM

## 2024-11-06 LAB — COMPREHENSIVE METABOLIC PANEL WITH GFR
ALT: 19 U/L (ref 0–35)
AST: 23 U/L (ref 0–37)
Albumin: 4.4 g/dL (ref 3.5–5.2)
Alkaline Phosphatase: 72 U/L (ref 39–117)
BUN: 25 mg/dL — ABNORMAL HIGH (ref 6–23)
CO2: 28 meq/L (ref 19–32)
Calcium: 9.6 mg/dL (ref 8.4–10.5)
Chloride: 100 meq/L (ref 96–112)
Creatinine, Ser: 0.91 mg/dL (ref 0.40–1.20)
GFR: 62.64 mL/min (ref >60.00)
Glucose, Bld: 114 mg/dL — ABNORMAL HIGH (ref 70–99)
Potassium: 4.5 meq/L (ref 3.5–5.1)
Sodium: 137 meq/L (ref 135–145)
Total Bilirubin: 0.4 mg/dL (ref 0.2–1.2)
Total Protein: 6.9 g/dL (ref 6.0–8.3)

## 2024-11-06 LAB — HEMOGLOBIN A1C: Hgb A1c MFr Bld: 6.1 % (ref 4.6–6.5)

## 2024-11-06 LAB — MICROALBUMIN / CREATININE URINE RATIO
Creatinine,U: 67.2 mg/dL
Microalb Creat Ratio: 15.1 mg/g (ref 0.0–30.0)
Microalb, Ur: 1 mg/dL (ref 0.0–1.9)

## 2024-11-06 LAB — VITAMIN D 25 HYDROXY (VIT D DEFICIENCY, FRACTURES): VITD: 83.21 ng/mL (ref 30.00–100.00)

## 2024-11-06 MED ORDER — BISOPROLOL-HYDROCHLOROTHIAZIDE 5-6.25 MG PO TABS: ORAL_TABLET | ORAL | 1 refills | Status: AC

## 2024-11-06 MED ORDER — ROSUVASTATIN CALCIUM 20 MG PO TABS: ORAL_TABLET | ORAL | 1 refills | Status: AC

## 2024-11-06 NOTE — Progress Notes (Signed)
 Established Patient Office Visit  Subjective   Patient ID: Shawna [name], female    DOB: 06/01/1951  Age: 73 y.o. MRN: [id number]  Chief Complaint  Patient presents with   Medical Management of Chronic Issues    HPI Discussed the use of AI scribe software for clinical note transcription with the patient, who gave verbal consent to proceed.  History of Present Illness   Shawna [name] is a 73 year old female who presents for an annual physical exam and routine blood work.  She has well-controlled hypertension on bisoprolol  HCTZ but does not check her blood pressure at home as often as recommended. She has no chest pain, shortness of breath, or palpitations.  She takes rosuvastatin  for cholesterol and vitamin D  supplements without side effects. Labs from last year were reviewed, with A1c 6.2% at her last visit.  She had a DEXA scan in 2022 and an eye exam on October 03, 2024.  She has no new symptoms or problems today. She has no current ankle swelling, though she notes some swelling during hot weather and no significant dilated leg veins.       Current Outpatient Medications  Medication Instructions   Ascorbic Acid (VITAMIN C PO) 500 mg, Daily   bisoprolol -hydrochlorothiazide  (ZIAC ) 5-6.25 MG tablet Take  1 tablet  every Morning  for BP   Cholecalciferol (VITAMIN D  PO) 2,000 Units   Magnesium 250 MG TABS Daily   rosuvastatin  (CRESTOR ) 20 MG tablet Take 1 tablet Daily for Cholesterol   Zinc 50 MG TABS Every other day    Patient Active Problem List   Diagnosis Date Noted   Prediabetes 05/03/2024   Peripheral edema 05/03/2024   Medication management 09/05/2014   Obesity (BMI 30.0-34.9) 09/05/2014   Essential hypertension 11/24/2013   Hyperlipidemia, mixed 11/24/2013   Abnormal glucose (prediabetes) 11/24/2013   Vitamin D  deficiency 11/24/2013   DCIS (ductal carcinoma in situ) of breast, Left lumpectomy. 09/23/2005.      Review of Systems  All other systems reviewed  and are negative.     Objective:     BP 128/64   Pulse 60   Temp (!) 96.7 F (35.9 C) (Axillary)   Ht 5' 2.25 (1.581 m)   Wt 197 lb 14.4 oz (89.8 kg)   SpO2 98%   BMI 35.91 kg/m    Physical Exam Vitals reviewed.  Constitutional:      Appearance: Normal appearance. She is well-groomed. She is obese.  Eyes:     Conjunctiva/sclera: Conjunctivae normal.  Neck:     Thyroid : No thyromegaly.  Cardiovascular:     Rate and Rhythm: Normal rate and regular rhythm.     Pulses: Normal pulses.     Heart sounds: S1 normal and S2 normal.  Pulmonary:     Effort: Pulmonary effort is normal.     Breath sounds: Normal breath sounds and air entry.  Abdominal:     General: Bowel sounds are normal.  Musculoskeletal:     Right lower leg: Edema (1+ pitting to the mid calf) present.     Left lower leg: Edema (1-2+ pitting to the mid calf) present.  Neurological:     Mental Status: She is alert and oriented to person, place, and time. Mental status is at baseline.     Gait: Gait is intact.  Psychiatric:        Mood and Affect: Mood and affect normal.        Speech: Speech normal.  Behavior: Behavior normal.        Judgment: Judgment normal.      No results found for any visits on 11/06/24.    The 10-year ASCVD risk score (Arnett DK, et al., 2019) is: 29.6%    Assessment & Plan:  Prediabetes -     Comprehensive metabolic panel with GFR; Future -     Microalbumin / creatinine urine ratio; Future -     Hemoglobin A1c; Future  Essential hypertension -     Bisoprolol -hydroCHLOROthiazide ; Take  1 tablet  every Morning  for BP  Dispense: 90 tablet; Refill: 1  Hyperlipidemia, mixed -     Rosuvastatin  Calcium ; Take 1 tablet Daily for Cholesterol  Dispense: 90 tablet; Refill: 1  Vitamin D  deficiency -     VITAMIN D  25 Hydroxy (Vit-D Deficiency, Fractures); Future   Assessment and Plan    Essential hypertension Blood pressure is well-controlled at 128/64 mmHg with  bisoprolol  HCTZ. - Continue bisoprolol  HCTZ. - Encouraged regular home blood pressure monitoring.  Mixed hyperlipidemia Currently managed with rosuvastatin  without side effects. Previous labs were normal. - Continue rosuvastatin .  Prediabetes Previous A1c was 6.2%. - Ordered A1c as part of blood work.  Vitamin D  deficiency Continues vitamin D  supplementation. - Ordered vitamin D  level as part of blood work.  Mild left lower extremity swelling Mild swelling in the left lower extremity, more pronounced in summer due to heat and blood vessel dilation. No significant varicose veins or other abnormalities. - Continue to monitor swelling, especially during summer months.  General Health Maintenance Routine health maintenance is up to date. DEXA scan scheduled. Mammogram and colonoscopy not needed. Regular dental and eye exams are maintained. - Proceed with scheduled DEXA scan. - Continue regular dental and eye exams.        Return in about 6 months (around 05/07/2025) for HTN.    Shawna CHRISTELLA Sharper, MD

## 2024-11-10 ENCOUNTER — Ambulatory Visit: Payer: Self-pay | Admitting: Family Medicine

## 2024-12-21 ENCOUNTER — Other Ambulatory Visit: Payer: Self-pay | Admitting: Family Medicine

## 2024-12-21 DIAGNOSIS — Z1231 Encounter for screening mammogram for malignant neoplasm of breast: Secondary | ICD-10-CM

## 2025-01-29 ENCOUNTER — Ambulatory Visit

## 2025-03-27 ENCOUNTER — Other Ambulatory Visit (HOSPITAL_BASED_OUTPATIENT_CLINIC_OR_DEPARTMENT_OTHER)

## 2025-05-07 ENCOUNTER — Ambulatory Visit: Admitting: Family Medicine
